# Patient Record
Sex: Male | Born: 1955 | Race: White | Hispanic: No | Marital: Married | Smoking: Former smoker
Health system: Southern US, Community
[De-identification: ages and names within clinical notes are randomized; demographics above are authoritative.]

## PROBLEM LIST (undated history)

## (undated) DIAGNOSIS — I4891 Unspecified atrial fibrillation: Secondary | ICD-10-CM

## (undated) DIAGNOSIS — K219 Gastro-esophageal reflux disease without esophagitis: Secondary | ICD-10-CM

## (undated) DIAGNOSIS — I499 Cardiac arrhythmia, unspecified: Secondary | ICD-10-CM

## (undated) DIAGNOSIS — I10 Essential (primary) hypertension: Secondary | ICD-10-CM

## (undated) DIAGNOSIS — M1990 Unspecified osteoarthritis, unspecified site: Secondary | ICD-10-CM

## (undated) DIAGNOSIS — N400 Enlarged prostate without lower urinary tract symptoms: Secondary | ICD-10-CM

## (undated) HISTORY — PX: COLONOSCOPY: SHX174

## (undated) HISTORY — PX: TONSILLECTOMY: SUR1361

## (undated) HISTORY — PX: VASECTOMY: SHX75

---

## 2005-06-25 ENCOUNTER — Emergency Department (HOSPITAL_COMMUNITY): Admission: EM | Admit: 2005-06-25 | Discharge: 2005-06-25 | Payer: Self-pay | Admitting: Emergency Medicine

## 2005-09-18 ENCOUNTER — Emergency Department (HOSPITAL_COMMUNITY): Admission: EM | Admit: 2005-09-18 | Discharge: 2005-09-18 | Payer: Self-pay | Admitting: Emergency Medicine

## 2006-01-26 ENCOUNTER — Emergency Department (HOSPITAL_COMMUNITY): Admission: EM | Admit: 2006-01-26 | Discharge: 2006-01-26 | Payer: Self-pay | Admitting: Emergency Medicine

## 2006-02-01 ENCOUNTER — Emergency Department (HOSPITAL_COMMUNITY): Admission: EM | Admit: 2006-02-01 | Discharge: 2006-02-01 | Payer: Self-pay | Admitting: Emergency Medicine

## 2008-02-23 ENCOUNTER — Emergency Department (HOSPITAL_COMMUNITY): Admission: EM | Admit: 2008-02-23 | Discharge: 2008-02-23 | Payer: Self-pay | Admitting: Emergency Medicine

## 2008-06-16 ENCOUNTER — Emergency Department (HOSPITAL_COMMUNITY): Admission: EM | Admit: 2008-06-16 | Discharge: 2008-06-16 | Payer: Self-pay | Admitting: Emergency Medicine

## 2010-10-25 ENCOUNTER — Emergency Department (HOSPITAL_COMMUNITY): Admission: EM | Admit: 2010-10-25 | Discharge: 2010-01-27 | Payer: Self-pay | Admitting: Emergency Medicine

## 2011-02-10 LAB — COMPREHENSIVE METABOLIC PANEL WITH GFR
ALT: 31 U/L (ref 0–53)
AST: 22 U/L (ref 0–37)
Albumin: 3.9 g/dL (ref 3.5–5.2)
Alkaline Phosphatase: 60 U/L (ref 39–117)
BUN: 20 mg/dL (ref 6–23)
CO2: 25 meq/L (ref 19–32)
Calcium: 9.2 mg/dL (ref 8.4–10.5)
Chloride: 107 meq/L (ref 96–112)
Creatinine, Ser: 0.96 mg/dL (ref 0.4–1.5)
GFR calc non Af Amer: >60 mL/min
Glucose, Bld: 122 mg/dL — ABNORMAL HIGH (ref 70–99)
Potassium: 4 meq/L (ref 3.5–5.1)
Sodium: 139 meq/L (ref 135–145)
Total Bilirubin: 0.9 mg/dL (ref 0.3–1.2)
Total Protein: 7.2 g/dL (ref 6.0–8.3)

## 2011-02-10 LAB — DIFFERENTIAL
Basophils Absolute: 0 10*3/uL (ref 0.0–0.1)
Basophils Relative: 0 % (ref 0–1)
Eosinophils Absolute: 0.3 10*3/uL (ref 0.0–0.7)
Eosinophils Relative: 4 % (ref 0–5)
Lymphocytes Relative: 29 % (ref 12–46)
Lymphs Abs: 1.7 10*3/uL (ref 0.7–4.0)
Monocytes Absolute: 0.5 10*3/uL (ref 0.1–1.0)
Monocytes Relative: 9 % (ref 3–12)
Neutro Abs: 3.4 10*3/uL (ref 1.7–7.7)
Neutrophils Relative %: 57 % (ref 43–77)

## 2011-02-10 LAB — CBC
HCT: 40.7 % (ref 39.0–52.0)
Hemoglobin: 14.4 g/dL (ref 13.0–17.0)
MCHC: 35.3 g/dL (ref 30.0–36.0)
MCV: 89 fL (ref 78.0–100.0)
Platelets: 298 K/uL (ref 150–400)
RBC: 4.57 MIL/uL (ref 4.22–5.81)
RDW: 13.1 % (ref 11.5–15.5)
WBC: 5.9 K/uL (ref 4.0–10.5)

## 2011-08-13 LAB — BASIC METABOLIC PANEL
BUN: 12
CO2: 23
Calcium: 9.7
Chloride: 108
Creatinine, Ser: 0.93
GFR calc Af Amer: >60
GFR calc non Af Amer: >60
Glucose, Bld: 102 — ABNORMAL HIGH
Potassium: 4.1
Sodium: 139

## 2011-08-13 LAB — DIFFERENTIAL
Basophils Absolute: 0
Basophils Relative: 0
Eosinophils Absolute: 0.2
Eosinophils Relative: 3
Lymphocytes Relative: 19
Lymphs Abs: 1.3
Monocytes Absolute: 0.6
Monocytes Relative: 8
Neutro Abs: 4.7
Neutrophils Relative %: 70

## 2011-08-13 LAB — POCT CARDIAC MARKERS
CKMB, poc: <1 — ABNORMAL LOW
Myoglobin, poc: 58.4
Operator id: 213671
Troponin i, poc: <0.05

## 2011-08-13 LAB — CBC
HCT: 41.8
Hemoglobin: 14.6
MCHC: 34.9
MCV: 88.1
Platelets: 294
RBC: 4.74
RDW: 13.8
WBC: 6.7

## 2011-08-13 LAB — D-DIMER, QUANTITATIVE: D-Dimer, Quant: 0.22

## 2012-09-18 ENCOUNTER — Emergency Department (HOSPITAL_COMMUNITY)
Admission: EM | Admit: 2012-09-18 | Discharge: 2012-09-18 | Disposition: A | Discharge status: Home / Self Care | Payer: Self-pay | Attending: Emergency Medicine | Admitting: Emergency Medicine

## 2012-09-18 ENCOUNTER — Encounter (HOSPITAL_COMMUNITY): Payer: Self-pay

## 2012-09-18 ENCOUNTER — Emergency Department (HOSPITAL_COMMUNITY): Payer: Self-pay

## 2012-09-18 DIAGNOSIS — M2391 Unspecified internal derangement of right knee: Secondary | ICD-10-CM

## 2012-09-18 DIAGNOSIS — Z9109 Other allergy status, other than to drugs and biological substances: Secondary | ICD-10-CM | POA: Insufficient documentation

## 2012-09-18 DIAGNOSIS — W11XXXA Fall on and from ladder, initial encounter: Secondary | ICD-10-CM | POA: Insufficient documentation

## 2012-09-18 DIAGNOSIS — Y929 Unspecified place or not applicable: Secondary | ICD-10-CM | POA: Insufficient documentation

## 2012-09-18 DIAGNOSIS — Z87891 Personal history of nicotine dependence: Secondary | ICD-10-CM | POA: Insufficient documentation

## 2012-09-18 DIAGNOSIS — S46009A Unspecified injury of muscle(s) and tendon(s) of the rotator cuff of unspecified shoulder, initial encounter: Secondary | ICD-10-CM

## 2012-09-18 DIAGNOSIS — Y9389 Activity, other specified: Secondary | ICD-10-CM | POA: Insufficient documentation

## 2012-09-18 DIAGNOSIS — S43429A Sprain of unspecified rotator cuff capsule, initial encounter: Secondary | ICD-10-CM | POA: Insufficient documentation

## 2012-09-18 DIAGNOSIS — M2390 Unspecified internal derangement of unspecified knee: Secondary | ICD-10-CM | POA: Insufficient documentation

## 2012-09-18 MED ORDER — HYDROCODONE-ACETAMINOPHEN 5-325 MG PO TABS: ORAL_TABLET | ORAL | Status: DC

## 2012-09-18 MED ORDER — TRAMADOL HCL 50 MG PO TABS
100.0000 mg | ORAL_TABLET | Freq: Once | ORAL | Status: AC
  Administered 2012-09-18: 100 mg via ORAL
  Filled 2012-09-18: qty 2

## 2012-09-18 MED ORDER — ACETAMINOPHEN 500 MG PO TABS
1000.0000 mg | ORAL_TABLET | Freq: Once | ORAL | Status: AC
  Administered 2012-09-18: 1000 mg via ORAL
  Filled 2012-09-18: qty 2

## 2012-09-18 NOTE — ED Provider Notes (Signed)
History   This chart was scribed for Ward Givens, MD by Gerlean Ren. This patient was seen in room APA01/APA01 and the patient's care was started at 10:53.   CSN: [id number]  Arrival date & time 09/18/12  1022   First MD Initiated Contact with Patient 09/18/12 1049      Chief Complaint  Patient presents with  . Shoulder Injury    (Consider location/radiation/quality/duration/timing/severity/associated sxs/prior treatment) The history is provided by the patient. No language interpreter was used.   Melvin Turner is a 56 y.o. male who presents to the Emergency Department complaining of constant, throbbing right shoulder pain radiating to elbow and constant, throbbing, non-radiating right knee pain after falling from ladder at height of 6 feet 2 days ago.  Pt landed on asphalt.  Pt denies head trauma, LOC, and any further injuries as a result.  Pt is unsure of most recent tetanus shot, and denies wanting one today.  Pt has no h/o chronic medical conditions.  Pt reports former tobacco use and reports alcohol use.     PCP is Dr. Olena Leatherwood in [place]   History reviewed. No pertinent past medical history.  History reviewed. No pertinent past surgical history.  No family history on file.  History  Substance Use Topics  . Smoking status: Former Games developer  . Smokeless tobacco: Not on file  . 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       Right shoulder pain. Right knee pain. Right hip pain.  Skin: Negative for wound.  All other systems reviewed and are negative.    Allergies  Poison ivy extract  Home Medications   Current Outpatient Rx  Name Route Sig Dispense Refill  none  BP 141/95  Pulse 70  Temp 97.9 F (36.6 C) (Oral)  Resp 16  Ht 5\' 11"  (1.803 m)  Wt 215 lb (97.523 kg)  BMI 29.99 kg/m2  SpO2 96%  Vital signs normal   Physical Exam  Nursing note and vitals  reviewed. Constitutional: He is oriented to person, place, and time. He appears well-developed and well-nourished.  Non-toxic appearance. He does not appear ill. No distress.  HENT:  Head: Normocephalic and atraumatic.  Right Ear: External ear normal.  Left Ear: External ear normal.  Nose: Nose normal. No mucosal edema or rhinorrhea.  Mouth/Throat: Oropharynx is clear and moist and mucous membranes are normal. No dental abscesses or uvula swelling.  Eyes: Conjunctivae normal and EOM are normal. Pupils are equal, round, and reactive to light.  Neck: Normal range of motion and full passive range of motion without pain. Neck supple.  Cardiovascular: Normal rate, regular rhythm and normal heart sounds.  Exam reveals no gallop and no friction rub.   No murmur heard. Pulmonary/Chest: Effort normal and breath sounds normal. No respiratory distress. He has no wheezes. He has no rhonchi. He has no rales. He exhibits no tenderness and no crepitus.  Abdominal: Soft. Normal appearance and bowel sounds are normal. He exhibits no distension. There is no tenderness. There is no rebound and no guarding.  Musculoskeletal: Normal range of motion. He exhibits no edema and no tenderness.       Non-tender clavicles. Non-tender to palpation in right shoulder.  Pain with right shoulder abduction which is restricted to about 60 degrees. Superficial abrasions on anterior right knee with diffuse swelling and small effusion. Palm-sized bruise  over proximal lateral right thigh.  Neurological: He is alert and oriented to person, place, and time. He has normal strength. No cranial nerve deficit.  Skin: Skin is warm, dry and intact. No rash noted. No erythema. No pallor.  Psychiatric: He has a normal mood and affect. His speech is normal and behavior is normal. His mood appears not anxious.    ED Course  Procedures (including critical care time)   Medications  traMADol (ULTRAM) tablet 100 mg (100 mg Oral Given 09/18/12  1153)  acetaminophen (TYLENOL) tablet 1,000 mg (1000 mg Oral Given 09/18/12 1153)    DIAGNOSTIC STUDIES: Oxygen Saturation is 96% on room air, adequate by my interpretation.    COORDINATION OF CARE: 10:59- Patient informed of clinical course, understands medical decision-making process, and agrees with plan.  Ordered PO ultram, PO tylenol, right shoulder XR, and right knee XR.   Pt placed in knee immobilizer. When discussed his xrays he is now able to lift him right arm to almost 90 degrees and states that is painful. States the tramadol didn't help.  Dg Shoulder Right  09/18/2012  *RADIOLOGY REPORT*  Clinical Data: 56 year old male status post fall with pain.  RIGHT SHOULDER - 2+ VIEW  Comparison: None.  Findings: No glenohumeral joint dislocation.  Proximal right humerus intact.  Right clavicle intact.  Right acromioclavicular joint degeneration with some chronic appearing osseous fragments. Visible right upper ribs and lung parenchyma within normal limits.  IMPRESSION: No acute fracture or dislocation identified about the right shoulder.   Original Report Authenticated By: Erskine Speed, M.D.    Dg Knee Complete 4 Views Right  09/18/2012  *RADIOLOGY REPORT*  Clinical Data: 56 year old male status post fall with pain and swelling.  RIGHT KNEE - COMPLETE 4+ VIEW  Comparison: None.  Findings: Small suprapatellar joint effusion.  Patella intact. Anterior soft tissue swelling anterior to the patella.  Mild medial compartment joint space loss and degenerative spurring.  No acute fracture identified.  IMPRESSION: Small joint effusion and anterior soft tissue swelling. No acute fracture or dislocation identified about the right knee.   Original Report Authenticated By: Erskine Speed, M.D.      1. Fall from ladder   2. Rotator cuff injury   3. Internal derangement of right knee    New Prescriptions   HYDROCODONE-ACETAMINOPHEN (NORCO/VICODIN) 5-325 MG PER TABLET    Take 1 or 2 po Q 6hrs for pain     Plan discharge  Devoria Albe, MD, FACEP    MDM   I personally performed the services described in this documentation, which was scribed in my presence. The recorded information has been reviewed and considered.  Devoria Albe, MD, Armando Gang         Ward Givens, MD 09/18/12 [phone]

## 2012-09-18 NOTE — ED Notes (Signed)
Pt c/o right shoulder pain after fall from ladder 2 days prior. Denies hitting head or LOC.

## 2014-03-10 ENCOUNTER — Emergency Department (HOSPITAL_COMMUNITY)
Admission: EM | Admit: 2014-03-10 | Discharge: 2014-03-10 | Disposition: A | Discharge status: Home / Self Care | Payer: Self-pay | Attending: Emergency Medicine | Admitting: Emergency Medicine

## 2014-03-10 ENCOUNTER — Encounter (HOSPITAL_COMMUNITY): Payer: Self-pay | Admitting: Emergency Medicine

## 2014-03-10 DIAGNOSIS — Y9389 Activity, other specified: Secondary | ICD-10-CM | POA: Insufficient documentation

## 2014-03-10 DIAGNOSIS — S0086XA Insect bite (nonvenomous) of other part of head, initial encounter: Secondary | ICD-10-CM

## 2014-03-10 DIAGNOSIS — Z79899 Other long term (current) drug therapy: Secondary | ICD-10-CM | POA: Insufficient documentation

## 2014-03-10 DIAGNOSIS — M129 Arthropathy, unspecified: Secondary | ICD-10-CM | POA: Insufficient documentation

## 2014-03-10 DIAGNOSIS — S60469A Insect bite (nonvenomous) of unspecified finger, initial encounter: Principal | ICD-10-CM

## 2014-03-10 DIAGNOSIS — Z87891 Personal history of nicotine dependence: Secondary | ICD-10-CM | POA: Insufficient documentation

## 2014-03-10 DIAGNOSIS — Y929 Unspecified place or not applicable: Secondary | ICD-10-CM | POA: Insufficient documentation

## 2014-03-10 DIAGNOSIS — W57XXXA Bitten or stung by nonvenomous insect and other nonvenomous arthropods, initial encounter: Principal | ICD-10-CM

## 2014-03-10 DIAGNOSIS — L089 Local infection of the skin and subcutaneous tissue, unspecified: Secondary | ICD-10-CM | POA: Insufficient documentation

## 2014-03-10 HISTORY — DX: Unspecified osteoarthritis, unspecified site: M19.90

## 2014-03-10 MED ORDER — HYDROCODONE-ACETAMINOPHEN 5-325 MG PO TABS: 1.0000 | ORAL_TABLET | ORAL | Status: DC | PRN

## 2014-03-10 MED ORDER — LIDOCAINE HCL (PF) 1 % IJ SOLN
INTRAMUSCULAR | Status: AC
  Filled 2014-03-10: qty 5

## 2014-03-10 MED ORDER — DOXYCYCLINE HYCLATE 100 MG PO TABS
100.0000 mg | ORAL_TABLET | Freq: Once | ORAL | Status: AC
  Administered 2014-03-10: 100 mg via ORAL
  Filled 2014-03-10: qty 1

## 2014-03-10 MED ORDER — CEFTRIAXONE SODIUM 1 G IJ SOLR
1.0000 g | Freq: Once | INTRAMUSCULAR | Status: AC
  Administered 2014-03-10: 1 g via INTRAMUSCULAR
  Filled 2014-03-10: qty 10

## 2014-03-10 MED ORDER — MINOCYCLINE HCL 100 MG PO CAPS: 100.0000 mg | ORAL_CAPSULE | Freq: Two times a day (BID) | ORAL | Status: DC

## 2014-03-10 NOTE — ED Notes (Signed)
Swollen area right side of nose,  Sore area .  Times one week.

## 2014-03-10 NOTE — ED Notes (Signed)
Patient given discharge instruction, verbalized understand. Patient ambulatory out of the department.  

## 2014-03-10 NOTE — Discharge Instructions (Signed)
Please apply warm compress to the right face 2 or 3 times daily. Use minocin 2 times daily with food until all taken. Please see Dr Olena LeatherwoodHasanaj in 3 or 4 days for recheck of the wound. Please come to the ED if any sign of infection advancing before you are seen by Dr Jerry CarasHansanaj. Use ibuprofen for mild discomfort. Use norco for more severe discomfort. This may cause drowsiness, use with caution. Facial Infection You have an infection of your face. This requires special attention to help prevent serious problems. Infections in facial wounds can cause poor healing and scars. They can also spread to deeper tissues, especially around the eye. Wound and dental infections can lead to sinusitis, infection of the eye socket, and even meningitis. Permanent damage to the skin, eye, and nervous system may result if facial infections are not treated properly. With severe infections, hospital care for IV antibiotic injections may be needed if they don't respond to oral antibiotics. Antibiotics must be taken for the full course to insure the infection is eliminated. If the infection came from a bad tooth, it may have to be extracted when the infection is under control. Warm compresses may be applied to reduce skin irritation and remove drainage. You might need a tetanus shot now if:  You cannot remember when your last tetanus shot was.  You have never had a tetanus shot.  The object that caused your wound was dirty. If you need a tetanus shot, and you decide not to get one, there is a rare chance of getting tetanus. Sickness from tetanus can be serious. If you got a tetanus shot, your arm may swell, get red and warm to the touch at the shot site. This is common and not a problem. SEEK IMMEDIATE MEDICAL CARE IF:   You have increased swelling, redness, or trouble breathing.  You have a severe headache, dizziness, nausea, or vomiting.  You develop problems with your eyesight.  You have a fever. Document Released:  12/12/2004 Document Revised: 01/27/2012 Document Reviewed: 11/04/2005 [facility] Patient Information 2014 [employer], [place].

## 2014-03-10 NOTE — ED Provider Notes (Signed)
CSN: [id number]     Arrival date & time 03/10/14  1613 History   First MD Initiated Contact with Patient 03/10/14 1622     Chief Complaint  Patient presents with  . Insect Bite     (Consider location/radiation/quality/duration/timing/severity/associated sxs/prior Treatment) HPI Comments: Pt sustained an insect bite to the right face nearly 1 week ago. He now has swelling of the area extending nearly to the lower right eye. He denies visual changes. No c/o difficulty with breathing. No high fevers. He has tried otc creams and ointments without success. He request evaluation of this problem.  The history is provided by the patient.    Past Medical History  Diagnosis Date  . Arthritis    History reviewed. No pertinent past surgical history. History reviewed. No pertinent family history. History  Substance Use Topics  . Smoking status: Former Games developermoker  . Smokeless tobacco: Not on file  . Alcohol Use: Yes     Comment: occ    Review of Systems  Constitutional: Negative for activity change.       All ROS Neg except as noted in HPI  HENT: Negative for nosebleeds.   Eyes: Negative for photophobia and discharge.  Respiratory: Negative for cough, shortness of breath and wheezing.   Cardiovascular: Negative for chest pain and palpitations.  Gastrointestinal: Negative for abdominal pain and blood in stool.  Genitourinary: Negative for dysuria, frequency and hematuria.  Musculoskeletal: Positive for arthralgias. Negative for back pain and neck pain.  Skin: Positive for wound.  Neurological: Negative for dizziness, seizures and speech difficulty.  Psychiatric/Behavioral: Negative for hallucinations and confusion.      Allergies  Poison ivy extract  Home Medications   Prior to Admission medications   Medication Sig Start Date End Date Taking? Authorizing Provider  HYDROcodone-acetaminophen (NORCO/VICODIN) 5-325 MG per tablet Take 1 or 2 po Q 6hrs for pain 09/18/12   Ward GivensIva L Knapp, MD   ibuprofen (ADVIL,MOTRIN) 200 MG tablet Take 800 mg by mouth every 6 (six) hours as needed. pain    Historical Provider, MD   BP 158/90  Pulse 72  Temp(Src) 97.7 F (36.5 C) (Oral)  Resp 16  Ht 5\' 11"  (1.803 m)  Wt 230 lb (104.327 kg)  BMI 32.09 kg/m2  SpO2 97% Physical Exam  Nursing note and vitals reviewed. Constitutional: He is oriented to person, place, and time. He appears well-developed and well-nourished.  Non-toxic appearance.  HENT:  Head: Normocephalic.  Right Ear: Tympanic membrane and external ear normal.  Left Ear: Tympanic membrane and external ear normal.  Pt has a dark scabbed area at the mid lateral right nostril. There is increase redness and swelling present around the scabbed area. No red streaking. No infiltration of the right nostril. Airway patent, speech is clear.  Eyes: EOM and lids are normal. Pupils are equal, round, and reactive to light.  No compromise of the EOMs. Anterior chamber clear.  Neck: Normal range of motion. Neck supple. Carotid bruit is not present.  Cardiovascular: Normal rate, regular rhythm, normal heart sounds, intact distal pulses and normal pulses.   Pulmonary/Chest: Breath sounds normal. No respiratory distress.  Abdominal: Soft. Bowel sounds are normal. There is no tenderness. There is no guarding.  Musculoskeletal: Normal range of motion.  Lymphadenopathy:       Head (right side): No submandibular adenopathy present.       Head (left side): No submandibular adenopathy present.    He has no cervical adenopathy.  Neurological: He is alert and  oriented to person, place, and time. He has normal strength. No cranial nerve deficit or sensory deficit.  Skin: Skin is warm and dry.  Psychiatric: He has a normal mood and affect. His speech is normal.    ED Course  Procedures (including critical care time) Labs Review Labs Reviewed - No data to display  Imaging Review No results found.   EKG Interpretation None      MDM Vital  signs wnl. Pulse ox 97% on room air. WNL by my interpretation. Pt has an infected wound to the right face. No compromise of the EOMs. No facial motor or sensory deficit. Plan - Pt treated with rocephin and doxycycline in the ED. Rx for minocycline given to the patient. Pt advised to apply warm compresses to the face 2 or 3 times daily. He will return if any advancing signs of infection.   Final diagnoses:  None    *I have reviewed nursing notes, vital signs, and all appropriate lab and imaging results for this patient.**    Kathie DikeHobson M Ashlley Booher, PA-C 03/10/14 1644  Kathie DikeHobson M Beverlyn Mcginness, PA-C 03/18/14 [phone]

## 2014-03-18 NOTE — ED Provider Notes (Signed)
Medical screening examination/treatment/procedure(s) were performed by non-physician practitioner and as supervising physician I was immediately available for consultation/collaboration.   EKG Interpretation None        Benny LennertJoseph L Joneric Streight, MD 03/18/14 [phone]

## 2014-12-24 ENCOUNTER — Encounter (HOSPITAL_COMMUNITY): Payer: Self-pay | Admitting: *Deleted

## 2014-12-24 ENCOUNTER — Emergency Department (HOSPITAL_COMMUNITY)
Admission: EM | Admit: 2014-12-24 | Discharge: 2014-12-24 | Disposition: A | Discharge status: Home / Self Care | Payer: Self-pay | Attending: Emergency Medicine | Admitting: Emergency Medicine

## 2014-12-24 ENCOUNTER — Emergency Department (HOSPITAL_COMMUNITY): Payer: Self-pay

## 2014-12-24 DIAGNOSIS — M25561 Pain in right knee: Secondary | ICD-10-CM

## 2014-12-24 DIAGNOSIS — M1711 Unilateral primary osteoarthritis, right knee: Secondary | ICD-10-CM | POA: Insufficient documentation

## 2014-12-24 DIAGNOSIS — Z79899 Other long term (current) drug therapy: Secondary | ICD-10-CM | POA: Insufficient documentation

## 2014-12-24 DIAGNOSIS — M1990 Unspecified osteoarthritis, unspecified site: Secondary | ICD-10-CM | POA: Insufficient documentation

## 2014-12-24 DIAGNOSIS — M25461 Effusion, right knee: Secondary | ICD-10-CM

## 2014-12-24 DIAGNOSIS — Z87891 Personal history of nicotine dependence: Secondary | ICD-10-CM | POA: Insufficient documentation

## 2014-12-24 MED ORDER — OXYCODONE-ACETAMINOPHEN 7.5-325 MG PO TABS: 1.0000 | ORAL_TABLET | ORAL | Status: DC | PRN

## 2014-12-24 MED ORDER — IBUPROFEN 800 MG PO TABS
800.0000 mg | ORAL_TABLET | Freq: Once | ORAL | Status: AC
  Administered 2014-12-24: 800 mg via ORAL
  Filled 2014-12-24: qty 1

## 2014-12-24 NOTE — ED Provider Notes (Signed)
CSN: [id number]     Arrival date & time 12/24/14  0533 History   First MD Initiated Contact with Patient 12/24/14 [phone]     Chief Complaint  Patient presents with  . Knee Pain     (Consider location/radiation/quality/duration/timing/severity/associated sxs/prior Treatment) The history is provided by the patient.   59 year old male with history of pain in his right knee noted that pain got worse 5 days ago when he had been doing some work going up and down a ladder. Since then, he has complained of severe pain and swelling in the right knee and inability to bear weight. He has been taking acetaminophen and hydrocodone-acetaminophen with no relief. He states he has an appointment with his orthopedic physician in 2 days but cannot wait for that appointment. He has been told that he may need a knee replacement. He states that there have been no x-rays done on his knee recently.   Past Medical History  Diagnosis Date  . Arthritis    No past surgical history on file. No family history on file. History  Substance Use Topics  . Smoking status: Former Games developermoker  . Smokeless tobacco: Not on file  . Alcohol Use: Yes     Comment: occ    Review of Systems  All other systems reviewed and are negative.     Allergies  Poison ivy extract  Home Medications   Prior to Admission medications   Medication Sig Start Date End Date Taking? Authorizing Provider  HYDROcodone-acetaminophen (NORCO/VICODIN) 5-325 MG per tablet Take 1 or 2 po Q 6hrs for pain 09/18/12   Ward GivensIva L Knapp, MD  HYDROcodone-acetaminophen (NORCO/VICODIN) 5-325 MG per tablet Take 1 tablet by mouth every 4 (four) hours as needed for moderate pain. 03/10/14   Kathie DikeHobson M Bryant, PA-C  ibuprofen (ADVIL,MOTRIN) 200 MG tablet Take 800 mg by mouth every 6 (six) hours as needed. pain    Historical Provider, MD  minocycline (MINOCIN) 100 MG capsule Take 1 capsule (100 mg total) by mouth 2 (two) times daily. 03/10/14   Kathie DikeHobson M Bryant, PA-C   BP  161/109 mmHg  Pulse 74  Temp(Src) 97.8 F (36.6 C) (Oral)  Resp 20  Ht 5\' 11"  (1.803 m)  Wt 217 lb (98.431 kg)  BMI 30.28 kg/m2  SpO2 100% Physical Exam  Nursing note and vitals reviewed.  59 year old male, who appears uncomfortable, but is in no acute distress. Vital signs are significant for hypertension. Oxygen saturation is 100%, which is normal. Head is normocephalic and atraumatic. PERRLA, EOMI. Oropharynx is clear. Neck is nontender and supple without adenopathy or JVD. Back is nontender and there is no CVA tenderness. Lungs are clear without rales, wheezes, or rhonchi. Chest is nontender. Heart has regular rate and rhythm without murmur. Abdomen is soft, flat, nontender without masses or hepatosplenomegaly and peristalsis is normoactive. Extremities: Right knee has a moderate to large effusion present and there is pain on passive range of motion but no pain on direct palpation. Distal neurovascular exam is intact. No other extremity pathology is seen.. Skin is warm and dry without rash. Neurologic: Mental status is normal, cranial nerves are intact, there are no motor or sensory deficits.  ED Course  Procedures (including critical care time)  Imaging Review Dg Knee Complete 4 Views Right  12/24/2014   CLINICAL DATA:  Right knee pain, increased over the last 2-3 days.  EXAM: RIGHT KNEE - COMPLETE 4+ VIEW  COMPARISON:  09/18/2012  FINDINGS: Progressive medial compartment osteoarthritis with  increased joint space narrowing and osteophytes. Unchanged osteoarthritis of the patellofemoral compartment. There is a joint effusion and anterior soft tissue edema. No fracture or dislocation.  IMPRESSION: Progressive osteoarthritis. No acute bony abnormality. Joint effusion, similar to prior.   Electronically Signed   By: Rubye Oaks M.D.   On: 12/24/2014 06:58     MDM   Final diagnoses:  Pain in right knee  Knee effusion, right  Osteoarthritis of right knee, unspecified  osteoarthritis type    Right knee pain with effusion. I suspect that this is related to arthritis in the knee. No evidence of gout. He will be sent for x-rays. He drove himself, so narcotics will not be given in the ED. He is given a dose of ibuprofen for pain. Review of his record on the [place] controlled substance reporting website shows that he has been getting prescriptions for 120 hydrocodone-acetaminophen 7.5-325 once a month for the last 6 months with last prescription filled on January 7.  X-ray confirms worsening of osteoarthritis. Patient now advises me that he is taking naproxen 500 mg twice a day in addition to above noted medications. He is given a knee immobilizer and crutches to use as needed and prescription is given for oxycodone-acetaminophen 7.5-325 to see if I will give him better pain relief and hydrocodone. He is referred back to his orthopedic physician for further evaluation and treatment.  Dione Booze, MD 12/24/14 [phone]

## 2014-12-24 NOTE — ED Notes (Signed)
Pt refused crutches.

## 2014-12-24 NOTE — Discharge Instructions (Signed)
Apply ice to your knee. Where the immobilizer and use crutches as needed. Continue taking Naprosyn 500 mg twice a day. Make an appointment with your orthopedic doctor for further evaluation and consideration for steroid injection.  Osteoarthritis Osteoarthritis is a disease that causes soreness and inflammation of a joint. It occurs when the cartilage at the affected joint wears down. Cartilage acts as a cushion, covering the ends of bones where they meet to form a joint. Osteoarthritis is the most common form of arthritis. It often occurs in older people. The joints affected most often by this condition include those in the:  Ends of the fingers.  Thumbs.  Neck.  Lower back.  Knees.  Hips. CAUSES  Over time, the cartilage that covers the ends of bones begins to wear away. This causes bone to rub on bone, producing pain and stiffness in the affected joints.  RISK FACTORS Certain factors can increase your chances of having osteoarthritis, including:  Older age.  Excessive body weight.  Overuse of joints.  Previous joint injury. SIGNS AND SYMPTOMS   Pain, swelling, and stiffness in the joint.  Over time, the joint may lose its normal shape.  Small deposits of bone (osteophytes) may grow on the edges of the joint.  Bits of bone or cartilage can break off and float inside the joint space. This may cause more pain and damage. DIAGNOSIS  Your health care provider will do a physical exam and ask about your symptoms. Various tests may be ordered, such as:  X-rays of the affected joint.  An MRI scan.  Blood tests to rule out other types of arthritis.  Joint fluid tests. This involves using a needle to draw fluid from the joint and examining the fluid under a microscope. TREATMENT  Goals of treatment are to control pain and improve joint function. Treatment plans may include:  A prescribed exercise program that allows for rest and joint relief.  A weight control  plan.  Pain relief techniques, such as:  Properly applied heat and cold.  Electric pulses delivered to nerve endings under the skin (transcutaneous electrical nerve stimulation [TENS]).  Massage.  Certain nutritional supplements.  Medicines to control pain, such as:  Acetaminophen.  Nonsteroidal anti-inflammatory drugs (NSAIDs), such as naproxen.  Narcotic or central-acting agents, such as tramadol.  Corticosteroids. These can be given orally or as an injection.  Surgery to reposition the bones and relieve pain (osteotomy) or to remove loose pieces of bone and cartilage. Joint replacement may be needed in advanced states of osteoarthritis. HOME CARE INSTRUCTIONS   Take medicines only as directed by your health care provider.  Maintain a healthy weight. Follow your health care provider's instructions for weight control. This may include dietary instructions.  Exercise as directed. Your health care provider can recommend specific types of exercise. These may include:  Strengthening exercises. These are done to strengthen the muscles that support joints affected by arthritis. They can be performed with weights or with exercise bands to add resistance.  Aerobic activities. These are exercises, such as brisk walking or low-impact aerobics, that get your heart pumping.  Range-of-motion activities. These keep your joints limber.  Balance and agility exercises. These help you maintain daily living skills.  Rest your affected joints as directed by your health care provider.  Keep all follow-up visits as directed by your health care provider. SEEK MEDICAL CARE IF:   Your skin turns red.  You develop a rash in addition to your joint pain.  You  have worsening joint pain.  You have a fever along with joint or muscle aches. SEEK IMMEDIATE MEDICAL CARE IF:  You have a significant loss of weight or appetite.  You have night sweats. FOR MORE INFORMATION   National Institute  of Arthritis and Musculoskeletal and Skin Diseases: www.niams.http://www.myers.net/  [employer] on Aging: https://walker.com/  American College of Rheumatology: www.rheumatology.org Document Released: 11/04/2005 Document Revised: 03/21/2014 Document Reviewed: 07/12/2013 [facility] Patient Information 2015 [place], [place]. This information is not intended to replace advice given to you by your health care provider. Make sure you discuss any questions you have with your health care provider.  Acetaminophen; Oxycodone tablets What is this medicine? ACETAMINOPHEN; OXYCODONE (a set a MEE noe fen; ox i KOE done) is a pain reliever. It is used to treat mild to moderate pain. This medicine may be used for other purposes; ask your health care provider or pharmacist if you have questions. COMMON BRAND NAME(S): Endocet, Magnacet, Narvox, Percocet, Perloxx, Primalev, Primlev, Roxicet, Xolox What should I tell my health care provider before I take this medicine? They need to know if you have any of these conditions: -brain tumor -Crohn's disease, inflammatory bowel disease, or ulcerative colitis -drug abuse or addiction -head injury -heart or circulation problems -if you often drink alcohol -kidney disease or problems going to the bathroom -liver disease -lung disease, asthma, or breathing problems -an unusual or allergic reaction to acetaminophen, oxycodone, other opioid analgesics, other medicines, foods, dyes, or preservatives -pregnant or trying to get pregnant -breast-feeding How should I use this medicine? Take this medicine by mouth with a full glass of water. Follow the directions on the prescription label. Take your medicine at regular intervals. Do not take your medicine more often than directed. Talk to your pediatrician regarding the use of this medicine in children. Special care may be needed. Patients over 58 years old may have a stronger reaction and need a smaller dose. Overdosage: If you think  you have taken too much of this medicine contact a poison control center or emergency room at once. NOTE: This medicine is only for you. Do not share this medicine with others. What if I miss a dose? If you miss a dose, take it as soon as you can. If it is almost time for your next dose, take only that dose. Do not take double or extra doses. What may interact with this medicine? -alcohol -antihistamines -barbiturates like amobarbital, butalbital, butabarbital, methohexital, pentobarbital, phenobarbital, thiopental, and secobarbital -benztropine -drugs for bladder problems like solifenacin, trospium, oxybutynin, tolterodine, hyoscyamine, and methscopolamine -drugs for breathing problems like ipratropium and tiotropium -drugs for certain stomach or intestine problems like propantheline, homatropine methylbromide, glycopyrrolate, atropine, belladonna, and dicyclomine -general anesthetics like etomidate, ketamine, nitrous oxide, propofol, desflurane, enflurane, halothane, isoflurane, and sevoflurane -medicines for depression, anxiety, or psychotic disturbances -medicines for sleep -muscle relaxants -naltrexone -narcotic medicines (opiates) for pain -phenothiazines like perphenazine, thioridazine, chlorpromazine, mesoridazine, fluphenazine, prochlorperazine, promazine, and trifluoperazine -scopolamine -tramadol -trihexyphenidyl This list may not describe all possible interactions. Give your health care provider a list of all the medicines, herbs, non-prescription drugs, or dietary supplements you use. Also tell them if you smoke, drink alcohol, or use illegal drugs. Some items may interact with your medicine. What should I watch for while using this medicine? Tell your doctor or health care professional if your pain does not go away, if it gets worse, or if you have new or a different type of pain. You may develop tolerance to the medicine. Tolerance  means that you will need a higher dose of the  medication for pain relief. Tolerance is normal and is expected if you take this medicine for a long time. Do not suddenly stop taking your medicine because you may develop a severe reaction. Your body becomes used to the medicine. This does NOT mean you are addicted. Addiction is a behavior related to getting and using a drug for a non-medical reason. If you have pain, you have a medical reason to take pain medicine. Your doctor will tell you how much medicine to take. If your doctor wants you to stop the medicine, the dose will be slowly lowered over time to avoid any side effects. You may get drowsy or dizzy. Do not drive, use machinery, or do anything that needs mental alertness until you know how this medicine affects you. Do not stand or sit up quickly, especially if you are an older patient. This reduces the risk of dizzy or fainting spells. Alcohol may interfere with the effect of this medicine. Avoid alcoholic drinks. There are different types of narcotic medicines (opiates) for pain. If you take more than one type at the same time, you may have more side effects. Give your health care provider a list of all medicines you use. Your doctor will tell you how much medicine to take. Do not take more medicine than directed. Call emergency for help if you have problems breathing. The medicine will cause constipation. Try to have a bowel movement at least every 2 to 3 days. If you do not have a bowel movement for 3 days, call your doctor or health care professional. Do not take Tylenol (acetaminophen) or medicines that have acetaminophen with this medicine. Too much acetaminophen can be very dangerous. Many nonprescription medicines contain acetaminophen. Always read the labels carefully to avoid taking more acetaminophen. What side effects may I notice from receiving this medicine? Side effects that you should report to your doctor or health care professional as soon as possible: -allergic reactions like  skin rash, itching or hives, swelling of the face, lips, or tongue -breathing difficulties, wheezing -confusion -light headedness or fainting spells -severe stomach pain -unusually weak or tired -yellowing of the skin or the whites of the eyes Side effects that usually do not require medical attention (report to your doctor or health care professional if they continue or are bothersome): -dizziness -drowsiness -nausea -vomiting This list may not describe all possible side effects. Call your doctor for medical advice about side effects. You may report side effects to FDA at 1-800-FDA-1088. Where should I keep my medicine? Keep out of the reach of children. This medicine can be abused. Keep your medicine in a safe place to protect it from theft. Do not share this medicine with anyone. Selling or giving away this medicine is dangerous and against the law. Store at room temperature between 20 and 25 degrees C (68 and 77 degrees F). Keep container tightly closed. Protect from light. This medicine may cause accidental overdose and death if it is taken by other adults, children, or pets. Flush any unused medicine down the toilet to reduce the chance of harm. Do not use the medicine after the expiration date. NOTE: This sheet is a summary. It may not cover all possible information. If you have questions about this medicine, talk to your doctor, pharmacist, or health care provider.  2015, Elsevier/Gold Standard. (2013-06-28 13:17:35)

## 2014-12-24 NOTE — ED Notes (Signed)
Pt reporting pain and swelling in right knee.  States symptoms became worse over past couple days.

## 2015-01-30 ENCOUNTER — Encounter (HOSPITAL_COMMUNITY)
Admission: RE | Admit: 2015-01-30 | Discharge: 2015-01-30 | Disposition: A | Discharge status: Home / Self Care | Payer: Self-pay | Source: Ambulatory Visit | Attending: Orthopaedic Surgery | Admitting: Orthopaedic Surgery

## 2015-01-30 ENCOUNTER — Other Ambulatory Visit: Payer: Self-pay

## 2015-01-30 ENCOUNTER — Encounter (HOSPITAL_COMMUNITY): Payer: Self-pay

## 2015-01-30 ENCOUNTER — Other Ambulatory Visit: Payer: Self-pay | Admitting: Radiology

## 2015-01-30 DIAGNOSIS — Z01812 Encounter for preprocedural laboratory examination: Secondary | ICD-10-CM | POA: Insufficient documentation

## 2015-01-30 DIAGNOSIS — Z01810 Encounter for preprocedural cardiovascular examination: Secondary | ICD-10-CM | POA: Insufficient documentation

## 2015-01-30 LAB — COMPREHENSIVE METABOLIC PANEL
ALT: 22 U/L (ref 0–53)
AST: 17 U/L (ref 0–37)
Albumin: 3.9 g/dL (ref 3.5–5.2)
Alkaline Phosphatase: 56 U/L (ref 39–117)
Anion gap: 7 (ref 5–15)
BUN: 18 mg/dL (ref 6–23)
CO2: 26 mmol/L (ref 19–32)
Calcium: 9.3 mg/dL (ref 8.4–10.5)
Chloride: 106 mmol/L (ref 96–112)
Creatinine, Ser: 0.7 mg/dL (ref 0.50–1.35)
GFR calc Af Amer: >90 mL/min (ref >90)
GFR calc non Af Amer: >90 mL/min (ref >90)
Glucose, Bld: 104 mg/dL — ABNORMAL HIGH (ref 70–99)
Potassium: 4 mmol/L (ref 3.5–5.1)
Sodium: 139 mmol/L (ref 135–145)
Total Bilirubin: 0.7 mg/dL (ref 0.3–1.2)
Total Protein: 7 g/dL (ref 6.0–8.3)

## 2015-01-30 LAB — URINALYSIS, ROUTINE W REFLEX MICROSCOPIC
Bilirubin Urine: NEGATIVE
Glucose, UA: NEGATIVE mg/dL
Hgb urine dipstick: NEGATIVE
Ketones, ur: NEGATIVE mg/dL
Leukocytes, UA: NEGATIVE
Nitrite: NEGATIVE
Protein, ur: NEGATIVE mg/dL
Specific Gravity, Urine: 1.025 (ref 1.005–1.030)
Urobilinogen, UA: 0.2 mg/dL (ref 0.0–1.0)
pH: 6 (ref 5.0–8.0)

## 2015-01-30 LAB — CBC WITH DIFFERENTIAL/PLATELET
Basophils Absolute: 0 10*3/uL (ref 0.0–0.1)
Basophils Relative: 0 % (ref 0–1)
Eosinophils Absolute: 0.2 10*3/uL (ref 0.0–0.7)
Eosinophils Relative: 2 % (ref 0–5)
HCT: 41.5 % (ref 39.0–52.0)
Hemoglobin: 13.8 g/dL (ref 13.0–17.0)
Lymphocytes Relative: 48 % — ABNORMAL HIGH (ref 12–46)
Lymphs Abs: 3.9 10*3/uL (ref 0.7–4.0)
MCH: 30.7 pg (ref 26.0–34.0)
MCHC: 33.3 g/dL (ref 30.0–36.0)
MCV: 92.2 fL (ref 78.0–100.0)
Monocytes Absolute: 0.4 10*3/uL (ref 0.1–1.0)
Monocytes Relative: 5 % (ref 3–12)
Neutro Abs: 3.6 10*3/uL (ref 1.7–7.7)
Neutrophils Relative %: 45 % (ref 43–77)
Platelets: 255 10*3/uL (ref 150–400)
RBC: 4.5 MIL/uL (ref 4.22–5.81)
RDW: 12.8 % (ref 11.5–15.5)
WBC: 8 10*3/uL (ref 4.0–10.5)

## 2015-01-30 NOTE — Pre-Procedure Instructions (Signed)
Patient given information to sign up for my chart at home. 

## 2015-01-30 NOTE — H&P (Signed)
Melvin Turner is an 59 y.o. male.   Chief Complaint: Right knee pain HPI: He has had right knee pain for some time.  I saw him around Thanksgiving of 2015 and his knee had swelling and popping but no giving way.  Around the end of January he started having more pain and giving way and swelling.  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  I have discussed arthroscopy with him and explained the risks and imponderables.  He asked appropriate questions.  He has no insurance and has discussed charges and expenses with the hospital.  He wants to proceed with the knee surgery at this time.  He knows it is an elective outpatient procedure.  He knows he will need physical therapy post operative.  He knows that the procedure will not correct his marked degenerative changes (arthritis) of the knee.  Past Medical History  Diagnosis Date  . Arthritis     No past surgical history on file.  No family history on file. Social History:  reports that he has quit smoking. He does not have any smokeless tobacco history on file. He reports that he drinks alcohol. He reports that he does not use illicit drugs.  Allergies:  Allergies  Allergen Reactions  . Poison Ivy Extract [Extract Of Poison Ivy] Hives and Rash    No prescriptions prior to admission    No results found for this or any previous visit (from the past 48 hour(s)). No results found.  Review of Systems  Musculoskeletal: Positive for joint pain (Pain, swelling tenderness of the right knee with giving way.).  All other systems reviewed and are negative.   There were no vitals taken for this visit. Physical Exam  Constitutional: He is oriented to person, place, and time. He appears well-developed and well-nourished.  HENT:  Head: Normocephalic and atraumatic.  Eyes: Conjunctivae and EOM are normal. Pupils are equal, round,  and reactive to light.  Neck: Normal range of motion. Neck supple.  Cardiovascular: Normal rate, regular rhythm, normal heart sounds and intact distal pulses.   Respiratory: Effort normal and breath sounds normal.  Musculoskeletal: He exhibits tenderness (Pain right knee with effusion.  ROM 0 to 105 and crepitus.  Positive McMurray medially.).  Neurological: He is alert and oriented to person, place, and time. He has normal reflexes.  Skin: Skin is warm and dry.  Psychiatric: He has a normal mood and affect. His behavior is normal. Judgment and thought content normal.     Assessment/Plan Tear of the medial meniscus of the right knee, for outpatient elective arthroscopy.  Delynda Sepulveda 01/30/2015, 11:04 AM

## 2015-01-30 NOTE — Patient Instructions (Addendum)
Laqueta CarinaGary F Nabers  01/30/2015   Your procedure is scheduled on:  01/31/2015  [facility] at  710  AM.  Call this number if you have problems the morning of surgery: [phone]   Remember:   Do not eat food or drink liquids after midnight.   Take these medicines the morning of surgery with A SIP OF WATER:  Hydrocodone (or percocet)   Do not wear jewelry, make-up or nail polish.  Do not wear lotions, powders, or perfumes.   Do not shave 48 hours prior to surgery. Men may shave face and neck.  Do not bring valuables to the hospital.  [facility] is not responsible for any belongings or valuables.               Contacts, dentures or bridgework may not be worn into surgery.  Leave suitcase in the car. After surgery it may be brought to your room.  For patients admitted to the hospital, discharge time is determined by your treatment team.               Patients discharged the day of surgery will not be allowed to drive home.  Name and phone number of your driver: family  Special Instructions: Shower using CHG 2 nights before surgery and the night before surgery.  If you shower the day of surgery use CHG.  Use special wash - you have one bottle of CHG for all showers.  You should use approximately 1/3 of the bottle for each shower.   Please read over the following fact sheets that you were given: Pain Booklet, Coughing and Deep Breathing, Surgical Site Infection Prevention, Anesthesia Post-op Instructions and Care and Recovery After Surgery Arthroscopic Procedure, Knee An arthroscopic procedure can find what is wrong with your knee. PROCEDURE Arthroscopy is a surgical technique that allows your orthopedic surgeon to diagnose and treat your knee injury with accuracy. They will look into your knee through a small instrument. This is almost like a small (pencil sized) telescope. Because arthroscopy affects your knee less than open knee surgery, you can anticipate a more rapid recovery.  Taking an active role by following your caregiver's instructions will help with rapid and complete recovery. Use crutches, rest, elevation, ice, and knee exercises as instructed. The length of recovery depends on various factors including type of injury, age, physical condition, medical conditions, and your rehabilitation. Your knee is the joint between the large bones (femur and tibia) in your leg. Cartilage covers these bone ends which are smooth and slippery and allow your knee to bend and move smoothly. Two menisci, thick, semi-lunar shaped pads of cartilage which form a rim inside the joint, help absorb shock and stabilize your knee. Ligaments bind the bones together and support your knee joint. Muscles move the joint, help support your knee, and take stress off the joint itself. Because of this all programs and physical therapy to rehabilitate an injured or repaired knee require rebuilding and strengthening your muscles. AFTER THE PROCEDURE  After the procedure, you will be moved to a recovery area until most of the effects of the medication have worn off. Your caregiver will discuss the test results with you.  Only take over-the-counter or prescription medicines for pain, discomfort, or fever as directed by your caregiver. SEEK MEDICAL CARE IF:   You have increased bleeding from your wounds.  You see redness, swelling, or have increasing pain in your wounds.  You have pus coming  from your wound.  You have an oral temperature above 102 F (38.9 C).  You notice a bad smell coming from the wound or dressing.  You have severe pain with any motion of your knee. SEEK IMMEDIATE MEDICAL CARE IF:   You develop a rash.  You have difficulty breathing.  You have any allergic problems. Document Released: 11/01/2000 Document Revised: 01/27/2012 Document Reviewed: 05/25/2008 [facility] Patient Information 2015 [place], [place]. This information is not intended to replace advice given to you by your  health care provider. Make sure you discuss any questions you have with your health care provider. PATIENT INSTRUCTIONS POST-ANESTHESIA  IMMEDIATELY FOLLOWING SURGERY:  Do not drive or operate machinery for the first twenty four hours after surgery.  Do not make any important decisions for twenty four hours after surgery or while taking narcotic pain medications or sedatives.  If you develop intractable nausea and vomiting or a severe headache please notify your doctor immediately.  FOLLOW-UP:  Please make an appointment with your surgeon as instructed. You do not need to follow up with anesthesia unless specifically instructed to do so.  WOUND CARE INSTRUCTIONS (if applicable):  Keep a dry clean dressing on the anesthesia/puncture wound site if there is drainage.  Once the wound has quit draining you may leave it open to air.  Generally you should leave the bandage intact for twenty four hours unless there is drainage.  If the epidural site drains for more than 36-48 hours please call the anesthesia department.  QUESTIONS?:  Please feel free to call your physician or the hospital operator if you have any questions, and they will be happy to assist you.

## 2015-01-31 ENCOUNTER — Encounter (HOSPITAL_COMMUNITY): Payer: Self-pay | Admitting: *Deleted

## 2015-01-31 ENCOUNTER — Ambulatory Visit (HOSPITAL_COMMUNITY): Payer: Self-pay | Admitting: Anesthesiology

## 2015-01-31 ENCOUNTER — Encounter (HOSPITAL_COMMUNITY)
Admission: RE | Disposition: A | Discharge status: Home / Self Care | Payer: Self-pay | Source: Ambulatory Visit | Attending: Orthopaedic Surgery

## 2015-01-31 ENCOUNTER — Ambulatory Visit (HOSPITAL_COMMUNITY)
Admission: RE | Admit: 2015-01-31 | Discharge: 2015-01-31 | Disposition: A | Discharge status: Home / Self Care | Payer: Self-pay | Source: Ambulatory Visit | Attending: Orthopaedic Surgery | Admitting: Orthopaedic Surgery

## 2015-01-31 DIAGNOSIS — M179 Osteoarthritis of knee, unspecified: Secondary | ICD-10-CM | POA: Insufficient documentation

## 2015-01-31 DIAGNOSIS — Y999 Unspecified external cause status: Secondary | ICD-10-CM | POA: Insufficient documentation

## 2015-01-31 DIAGNOSIS — Z87891 Personal history of nicotine dependence: Secondary | ICD-10-CM | POA: Insufficient documentation

## 2015-01-31 DIAGNOSIS — X58XXXA Exposure to other specified factors, initial encounter: Secondary | ICD-10-CM | POA: Insufficient documentation

## 2015-01-31 DIAGNOSIS — Y929 Unspecified place or not applicable: Secondary | ICD-10-CM | POA: Insufficient documentation

## 2015-01-31 DIAGNOSIS — Y939 Activity, unspecified: Secondary | ICD-10-CM | POA: Insufficient documentation

## 2015-01-31 DIAGNOSIS — S83241A Other tear of medial meniscus, current injury, right knee, initial encounter: Secondary | ICD-10-CM | POA: Insufficient documentation

## 2015-01-31 HISTORY — PX: KNEE ARTHROSCOPY: SHX127

## 2015-01-31 SURGERY — ARTHROSCOPY, KNEE
Anesthesia: General | Site: Knee | Laterality: Right

## 2015-01-31 MED ORDER — PROPOFOL 10 MG/ML IV BOLUS
INTRAVENOUS | Status: DC | PRN
  Administered 2015-01-31: 200 mg via INTRAVENOUS
  Administered 2015-01-31: 25 mg via INTRAVENOUS

## 2015-01-31 MED ORDER — PROPOFOL 10 MG/ML IV BOLUS
INTRAVENOUS | Status: AC
  Filled 2015-01-31: qty 20

## 2015-01-31 MED ORDER — MIDAZOLAM HCL 2 MG/2ML IJ SOLN
1.0000 mg | INTRAMUSCULAR | Status: DC | PRN
  Administered 2015-01-31 (×2): 2 mg via INTRAVENOUS
  Filled 2015-01-31: qty 2

## 2015-01-31 MED ORDER — ONDANSETRON HCL 4 MG/2ML IJ SOLN: 4.0000 mg | Freq: Once | INTRAMUSCULAR | Status: DC | PRN

## 2015-01-31 MED ORDER — MIDAZOLAM HCL 2 MG/2ML IJ SOLN
INTRAMUSCULAR | Status: AC
  Filled 2015-01-31: qty 2

## 2015-01-31 MED ORDER — DEXAMETHASONE SODIUM PHOSPHATE 4 MG/ML IJ SOLN
INTRAMUSCULAR | Status: AC
  Filled 2015-01-31: qty 1

## 2015-01-31 MED ORDER — GLYCOPYRROLATE 0.2 MG/ML IJ SOLN
0.2000 mg | Freq: Once | INTRAMUSCULAR | Status: AC
  Administered 2015-01-31: 0.2 mg via INTRAVENOUS

## 2015-01-31 MED ORDER — SODIUM CHLORIDE 0.9 % IR SOLN
Status: DC | PRN
  Administered 2015-01-31: 1000 mL

## 2015-01-31 MED ORDER — LIDOCAINE HCL (PF) 1 % IJ SOLN
INTRAMUSCULAR | Status: AC
  Filled 2015-01-31: qty 5

## 2015-01-31 MED ORDER — FENTANYL CITRATE 0.05 MG/ML IJ SOLN
25.0000 ug | INTRAMUSCULAR | Status: AC
  Administered 2015-01-31 (×2): 25 ug via INTRAVENOUS
  Filled 2015-01-31: qty 2

## 2015-01-31 MED ORDER — FENTANYL CITRATE 0.05 MG/ML IJ SOLN
INTRAMUSCULAR | Status: DC | PRN
  Administered 2015-01-31 (×2): 50 ug via INTRAVENOUS
  Administered 2015-01-31 (×3): 25 ug via INTRAVENOUS
  Administered 2015-01-31: 50 ug via INTRAVENOUS
  Administered 2015-01-31: 25 ug via INTRAVENOUS

## 2015-01-31 MED ORDER — ONDANSETRON HCL 4 MG/2ML IJ SOLN
4.0000 mg | Freq: Once | INTRAMUSCULAR | Status: AC
  Administered 2015-01-31: 4 mg via INTRAVENOUS

## 2015-01-31 MED ORDER — BUPIVACAINE HCL (PF) 0.25 % IJ SOLN
INTRAMUSCULAR | Status: DC | PRN
  Administered 2015-01-31: 30 mL

## 2015-01-31 MED ORDER — ONDANSETRON HCL 4 MG/2ML IJ SOLN
INTRAMUSCULAR | Status: AC
  Filled 2015-01-31: qty 2

## 2015-01-31 MED ORDER — BUPIVACAINE HCL (PF) 0.25 % IJ SOLN
INTRAMUSCULAR | Status: AC
  Filled 2015-01-31: qty 30

## 2015-01-31 MED ORDER — LACTATED RINGERS IV SOLN
INTRAVENOUS | Status: DC
  Administered 2015-01-31: 1000 mL via INTRAVENOUS

## 2015-01-31 MED ORDER — FENTANYL CITRATE 0.05 MG/ML IJ SOLN
25.0000 ug | INTRAMUSCULAR | Status: DC | PRN
  Administered 2015-01-31 (×4): 50 ug via INTRAVENOUS
  Filled 2015-01-31: qty 2

## 2015-01-31 MED ORDER — CHLORHEXIDINE GLUCONATE 4 % EX LIQD: 60.0000 mL | Freq: Once | CUTANEOUS | Status: DC

## 2015-01-31 MED ORDER — FENTANYL CITRATE 0.05 MG/ML IJ SOLN
INTRAMUSCULAR | Status: AC
  Filled 2015-01-31: qty 2

## 2015-01-31 MED ORDER — FENTANYL CITRATE 0.05 MG/ML IJ SOLN
INTRAMUSCULAR | Status: AC
  Filled 2015-01-31: qty 5

## 2015-01-31 MED ORDER — LIDOCAINE HCL 1 % IJ SOLN
INTRAMUSCULAR | Status: DC | PRN
  Administered 2015-01-31: 40 mg via INTRADERMAL

## 2015-01-31 MED ORDER — LACTATED RINGERS IR SOLN
Status: DC | PRN
  Administered 2015-01-31 (×3): 3000 mL

## 2015-01-31 MED ORDER — DEXAMETHASONE SODIUM PHOSPHATE 4 MG/ML IJ SOLN
4.0000 mg | Freq: Once | INTRAMUSCULAR | Status: AC
  Administered 2015-01-31: 4 mg via INTRAVENOUS

## 2015-01-31 MED ORDER — GLYCOPYRROLATE 0.2 MG/ML IJ SOLN
INTRAMUSCULAR | Status: AC
  Filled 2015-01-31: qty 1

## 2015-01-31 SURGICAL SUPPLY — 61 items
BAG HAMPER (MISCELLANEOUS) ×3 IMPLANT
BANDAGE ELASTIC 6 VELCRO NS (GAUZE/BANDAGES/DRESSINGS) ×3 IMPLANT
BANDAGE ESMARK 4X12 BL STRL LF (DISPOSABLE) ×1 IMPLANT
BLADE SURG 15 STRL LF DISP TIS (BLADE) ×1 IMPLANT
BLADE SURG 15 STRL SS (BLADE) ×3
BLADE SURG SZ11 CARB STEEL (BLADE) ×3 IMPLANT
BNDG CMPR 12X4 ELC STRL LF (DISPOSABLE) ×1
BNDG ESMARK 4X12 BLUE STRL LF (DISPOSABLE) ×3
CLOTH BEACON ORANGE TIMEOUT ST (SAFETY) ×3 IMPLANT
CUFF TOURNIQUET SINGLE 34IN LL (TOURNIQUET CUFF) IMPLANT
CUFF TOURNIQUET SINGLE 44IN (TOURNIQUET CUFF) ×2 IMPLANT
CUTTER TOMCAT 4.0M (BURR) ×3 IMPLANT
DECANTER SPIKE VIAL GLASS SM (MISCELLANEOUS) ×3 IMPLANT
DRAPE PROXIMA HALF (DRAPES) ×3 IMPLANT
DRSG XEROFORM 1X8 (GAUZE/BANDAGES/DRESSINGS) ×3 IMPLANT
DURAPREP 26ML APPLICATOR (WOUND CARE) ×6 IMPLANT
ELECT MENISCUS 165MM 90D (ELECTRODE) IMPLANT
FORMALIN 10 PREFIL 480ML (MISCELLANEOUS) ×3 IMPLANT
GAUZE SPONGE 4X4 12PLY STRL (GAUZE/BANDAGES/DRESSINGS) ×2 IMPLANT
GAUZE SPONGE 4X4 16PLY XRAY LF (GAUZE/BANDAGES/DRESSINGS) ×3 IMPLANT
GAUZE XEROFORM 5X9 LF (GAUZE/BANDAGES/DRESSINGS) ×2 IMPLANT
GLOVE BIO SURGEON STRL SZ8 (GLOVE) ×3 IMPLANT
GLOVE BIO SURGEON STRL SZ8.5 (GLOVE) ×3 IMPLANT
GLOVE BIOGEL PI IND STRL 7.0 (GLOVE) IMPLANT
GLOVE BIOGEL PI INDICATOR 7.0 (GLOVE) ×2
GLOVE EXAM NITRILE LRG STRL (GLOVE) ×2 IMPLANT
GLOVE SS BIOGEL STRL SZ 6.5 (GLOVE) IMPLANT
GLOVE SUPERSENSE BIOGEL SZ 6.5 (GLOVE) ×2
GOWN STRL REUS W/TWL LRG LVL3 (GOWN DISPOSABLE) ×3 IMPLANT
GOWN STRL REUS W/TWL XL LVL3 (GOWN DISPOSABLE) ×3 IMPLANT
HANDPIECE (MISCELLANEOUS) IMPLANT
HLDR LEG FOAM (MISCELLANEOUS) ×1 IMPLANT
IV LACTATED RINGER IRRG 3000ML (IV SOLUTION) ×9
IV LR IRRIG 3000ML ARTHROMATIC (IV SOLUTION) ×2 IMPLANT
KIT BLADEGUARD II DBL (SET/KITS/TRAYS/PACK) ×3 IMPLANT
KIT ROOM TURNOVER AP CYSTO (KITS) ×3 IMPLANT
KNIFE HOOK (BLADE) IMPLANT
KNIFE MENISECTOMY (BLADE) IMPLANT
LEG HOLDER FOAM (MISCELLANEOUS) ×2
MANIFOLD NEPTUNE II (INSTRUMENTS) ×3 IMPLANT
MARKER SKIN DUAL TIP RULER LAB (MISCELLANEOUS) ×3 IMPLANT
NDL HYPO 21X1.5 SAFETY (NEEDLE) ×1 IMPLANT
NDL SPNL 18GX3.5 QUINCKE PK (NEEDLE) ×1 IMPLANT
NEEDLE HYPO 21X1.5 SAFETY (NEEDLE) ×3 IMPLANT
NEEDLE SPNL 18GX3.5 QUINCKE PK (NEEDLE) ×3 IMPLANT
NS IRRIG 1000ML POUR BTL (IV SOLUTION) ×3 IMPLANT
PACK ARTHRO LIMB DRAPE STRL (MISCELLANEOUS) ×1 IMPLANT
PAD ABD 5X9 TENDERSORB (GAUZE/BANDAGES/DRESSINGS) ×3 IMPLANT
PAD ARMBOARD 7.5X6 YLW CONV (MISCELLANEOUS) ×3 IMPLANT
PADDING CAST COTTON 6X4 STRL (CAST SUPPLIES) ×3 IMPLANT
PADDING WEBRIL 6 STERILE (GAUZE/BANDAGES/DRESSINGS) ×2 IMPLANT
SET ARTHROSCOPY INST (INSTRUMENTS) ×3 IMPLANT
SET BASIN LINEN APH (SET/KITS/TRAYS/PACK) ×3 IMPLANT
SPONGE GAUZE 4X4 12PLY (GAUZE/BANDAGES/DRESSINGS) ×2 IMPLANT
SUT ETHILON 3 0 FSL (SUTURE) ×3 IMPLANT
SYR 30ML LL (SYRINGE) ×3 IMPLANT
TIP 0 DEGREE (MISCELLANEOUS) IMPLANT
TIP 30 DEGREE (MISCELLANEOUS) IMPLANT
TIP 70 DEGREE (MISCELLANEOUS) IMPLANT
TUBING FLOSTEADY ARTHRO PUMP (IRRIGATION / IRRIGATOR) ×3 IMPLANT
YANKAUER SUCT BULB TIP 10FT TU (MISCELLANEOUS) ×6 IMPLANT

## 2015-01-31 NOTE — Transfer of Care (Signed)
Immediate Anesthesia Transfer of Care Note  Patient: Melvin Turner  Procedure(s) Performed: Procedure(s): RIGHT KNEE ARTHROSCOPY, PARTIAL MEDIAL MENISECTOMY (Right)  Patient Location: PACU  Anesthesia Type:General  Level of Consciousness: awake  Airway & Oxygen Therapy: Patient Spontanous Breathing and Patient connected to face mask oxygen  Post-op Assessment: Report given to RN  Post vital signs: Reviewed and stable  Last Vitals:  Filed Vitals:   01/31/15 0840  BP: 120/72  Temp:   Resp: 20    Complications: No apparent anesthesia complications

## 2015-01-31 NOTE — Op Note (Signed)
Melvin Turner, Melvin Turner                  ACCOUNT NO.:  [id number]  MEDICAL RECORD NO.:  [id number]  LOCATION:  [facility]                          FACILITY:  [facility]  PHYSICIAN:  J. Darreld Mclean, M.D. DATE OF BIRTH:  [birth year]  DATE OF PROCEDURE:  01/31/2015 DATE OF DISCHARGE:  01/31/2015                              OPERATIVE REPORT   PREOPERATIVE DIAGNOSES:  Tear of medial meniscus of the right knee, degenerative joint disease.  POSTOPERATIVE DIAGNOSES:  Tear of medial meniscus of the right knee, degenerative joint disease.  PROCEDURE:  Operative arthroscopy, partial medial meniscectomy of the right knee.  ANESTHESIA:  General.  TOURNIQUET TIME:  20 minutes.  DRAINS:  No drains.  SURGEON:  J. Darreld Mclean, M.D.  INDICATIONS:  The patient is a 59 year old male with pain and tenderness in his knee that got progressively worse over the last several months, particularly last several weeks.  He has been having pain in the knee since before Thanksgiving.  I saw him in November, however, in the last few weeks his knee has been giving way, swelling, painful, tender.  He had an MRI done at [facility] that showed tear of the medial meniscus plus severe degenerative joint disease in the medial side of his knee. He will be a candidate for total knee in the future.  I explained to him the findings and recommended arthroscopy at this time.  I went over the risks and imponderables.  He appeared to understand, agreed with the procedure.  He elected the procedure done today as an outpatient as scheduled.  DESCRIPTION OF PROCEDURE:  The patient was seen in the holding area and the right knee was identified as the correct surgical site.  He placed a mark on the right knee, I placed a mark on the right knee.  The patient was brought to the operating room and placed supine on the operating room table.  Leg holder and tourniquet placed and deflated.  He was given general anesthesia.  I had  generalized time-out and identified the patient as Melvin Turner, we are doing his right knee for arthroscopy.  All instrumentation were properly positioned and working.  The OR team knew each other. Previously applied mark on the knee was still present after prep and drape.  The leg was elevated, wrapped circumferentially with an Esmarch bandage and tourniquet inflated to 300 mmHg.  Inflow cannula inserted medially on the right knee.  Lactated Ringer's instilled into the knee by an infusion pump.  Arthroscope inserted laterally, the knee systematically examined.  Permanent pictures were taken.  Suprapatellar pouch and synovitis.  Undersurface of the patella had grade 2 to early grade 3 changes.  Medially, he had grade 4 changes on the tibial plateau, more medially and a stellate diffuse tear of the meniscus.  He had grade 3 changes on the femoral condyle.  Anterior cruciate was intact. Laterally, it looked very good with a slight fraying.  For some reason at the end of the case when I reviewed the pictures, the lateral pictures for some reason did not show up.  Attention was directed back to the medial side of the knee.  Using a meniscal  punch and meniscal shaver, good smooth contour was obtained of the tear.  The knee was systematically reexamined.  There were no loose fragments and no loose bodies.  The knee was irrigated with remaining part of Lactated Ringer's.  Wounds were reapproximated using 3-0 nylon interrupted vertical mattress manner.  Marcaine 0.25% was instilled into each portal.  Tourniquet deflated after 20 minutes.  Sterile dressing applied.  The patient will go to recovery in good condition.  He will get appropriate analgesia for pain.  I will see him in my office in approximately 10 days to 2 weeks.  He will have physical therapy.  If any difficulties, contact me through the office by hospital beeper system.          ______________________________ Shela CommonsJ. Darreld McleanWayne Gaynelle Turner,  M.D.     JWK/MEDQ  D:  01/31/2015  T:  01/31/2015  Job:  [id number]

## 2015-01-31 NOTE — Progress Notes (Signed)
The History and Physical is unchanged. I have examined the patient. The patient is medically able to have surgery on the right knee . Brissa Asante 

## 2015-01-31 NOTE — Anesthesia Postprocedure Evaluation (Signed)
  Anesthesia Post-op Note  Patient: Melvin Turner  Procedure(s) Performed: Procedure(s): RIGHT KNEE ARTHROSCOPY, PARTIAL MEDIAL MENISECTOMY (Right)  Patient Location: PACU  Anesthesia Type:General  Level of Consciousness: awake, alert  and oriented  Airway and Oxygen Therapy: Patient Spontanous Breathing  Post-op Pain: mild  Post-op Assessment: Post-op Vital signs reviewed, Patient's Cardiovascular Status Stable, Respiratory Function Stable, Patent Airway and No signs of Nausea or vomiting  Post-op Vital Signs: Reviewed and stable  Last Vitals:  Filed Vitals:   01/31/15 0840  BP: 120/72  Temp:   Resp: 20    Complications: No apparent anesthesia complications

## 2015-01-31 NOTE — Anesthesia Procedure Notes (Signed)
Procedure Name: LMA Insertion Performed by: Tamakia Porto E Pre-anesthesia Checklist: Patient identified, Patient being monitored, Emergency Drugs available, Timeout performed and Suction available Patient Re-evaluated:Patient Re-evaluated prior to inductionOxygen Delivery Method: Circle System Utilized Preoxygenation: Pre-oxygenation with 100% oxygen Intubation Type: IV induction Ventilation: Mask ventilation without difficulty LMA: LMA inserted LMA Size: 4.0 Number of attempts: 1 Placement Confirmation: positive ETCO2 and breath sounds checked- equal and bilateral     

## 2015-01-31 NOTE — Anesthesia Preprocedure Evaluation (Addendum)
Anesthesia Evaluation  Patient identified by MRN, date of birth, ID band Patient awake    Reviewed: Allergy & Precautions, NPO status , Patient's Chart, lab work & pertinent test results  Airway Mallampati: II  TM Distance: >3 FB     Dental  (+) Teeth Intact, Dental Advisory Given   Pulmonary former smoker,  breath sounds clear to auscultation        Cardiovascular negative cardio ROS  Rhythm:Regular Rate:Normal     Neuro/Psych    GI/Hepatic negative GI ROS,   Endo/Other    Renal/GU      Musculoskeletal  (+) Arthritis -,   Abdominal   Peds  Hematology   Anesthesia Other Findings   Reproductive/Obstetrics                           Anesthesia Physical Anesthesia Plan  ASA: I  Anesthesia Plan: General   Post-op Pain Management:    Induction: Intravenous  Airway Management Planned: LMA  Additional Equipment:   Intra-op Plan:   Post-operative Plan: Extubation in OR  Informed Consent: I have reviewed the patients History and Physical, chart, labs and discussed the procedure including the risks, benefits and alternatives for the proposed anesthesia with the patient or authorized representative who has indicated his/her understanding and acceptance.     Plan Discussed with:   Anesthesia Plan Comments:         Anesthesia Quick Evaluation

## 2015-01-31 NOTE — Discharge Instructions (Signed)
Use crutches or walker. Use ice to the right knee.  You may bear weight on the right as tolerated.  You may remove dressing on right knee when you desire. You may cleanse wounds with peroxide, otherwise keep the wounds dry.  Apply Band-aids over each of the stitches.  Keep physical therapy appointment.  See Dr. Hilda LiasKeeling in ten days to two weeks.  Call his office at [phone] if any problem or if after hours, the hospital at [phone].

## 2015-01-31 NOTE — Brief Op Note (Signed)
01/31/2015  9:32 AM  PATIENT:  Melvin Turner  59 y.o. male  PRE-OPERATIVE DIAGNOSIS:  tear medial meniscus right knee  POST-OPERATIVE DIAGNOSIS:  tear medial meniscus right knee  PROCEDURE:  Procedure(s): ARTHROSCOPY KNEE (Right), partial medial menisectomy  SURGEON:  Surgeon(s) and Role:    * Darreld McleanWayne Cullan Launer, MD - Primary  PHYSICIAN ASSISTANT:   ASSISTANTS: none   ANESTHESIA:   general  EBL:     BLOOD ADMINISTERED:none  DRAINS: none   LOCAL MEDICATIONS USED:  MARCAINE     SPECIMEN:  Source of Specimen:  right knee medial meniscus shavings  DISPOSITION OF SPECIMEN:  PATHOLOGY  COUNTS:  YES  TOURNIQUET:   Total Tourniquet Time Documented: Thigh (Right) - 19 minutes Total: Thigh (Right) - 19 minutes   DICTATION: .Other Dictation: Dictation Number [id number]  PLAN OF CARE: Discharge to home after PACU  PATIENT DISPOSITION:  PACU - hemodynamically stable.   Delay start of Pharmacological VTE agent (>24hrs) due to surgical blood loss or risk of bleeding: not applicable

## 2015-02-02 ENCOUNTER — Encounter (HOSPITAL_COMMUNITY): Payer: Self-pay | Admitting: Orthopaedic Surgery

## 2015-02-06 ENCOUNTER — Ambulatory Visit (HOSPITAL_COMMUNITY): Payer: Self-pay | Attending: Orthopaedic Surgery | Admitting: Physical Therapy

## 2015-02-06 DIAGNOSIS — M25562 Pain in left knee: Secondary | ICD-10-CM | POA: Insufficient documentation

## 2015-02-06 DIAGNOSIS — R262 Difficulty in walking, not elsewhere classified: Secondary | ICD-10-CM | POA: Insufficient documentation

## 2015-02-06 DIAGNOSIS — R29898 Other symptoms and signs involving the musculoskeletal system: Secondary | ICD-10-CM | POA: Insufficient documentation

## 2015-02-06 DIAGNOSIS — M25662 Stiffness of left knee, not elsewhere classified: Secondary | ICD-10-CM | POA: Insufficient documentation

## 2015-02-06 NOTE — Therapy (Addendum)
[facility] [address] [place], [place], [address] Phone: [phone]   Fax:  [phone]  Physical Therapy Evaluation  Patient Details  Name: Melvin Turner MRN: [id number] Date of Birth: 11/07/1956 Referring Provider:  Darreld Mclean, MD  Encounter Date: 02/06/2015      PT End of Session - 02/06/15 1158    Visit Number 1   Number of Visits 4   Date for PT Re-Evaluation 04/07/15   Authorization Type none   PT Start Time 1100   PT Stop Time 1150   PT Time Calculation (min) 50 min   Activity Tolerance Patient tolerated treatment well   Behavior During Therapy [facility] for tasks assessed/performed      Past Medical History  Diagnosis Date  . Arthritis     Past Surgical History  Procedure Laterality Date  . Colonoscopy    . Vasectomy    . Knee arthroscopy Right 01/31/2015    Procedure: RIGHT KNEE ARTHROSCOPY, PARTIAL MEDIAL MENISECTOMY;  Surgeon: Darreld Mclean, MD;  Location: [facility];  Service: Orthopedics;  Laterality: Right;    There were no vitals filed for this visit.  Visit Diagnosis:  Stiffness of knee joint, Right s/p arthroscopic surgery  Right leg weakness  Pain in knee joint, right  Difficulty walking      Subjective Assessment - 02/06/15 1111    Symptoms Pt states that he has had knee pain for years after falling off a ladder  but his knee begain to give way on him in January of this year therefore he opted to have arthroscopic surgery.  He is being referred to PT to maximiize his functional ability.   Pertinent History N/A   How long can you sit comfortably? 5-10 minutes   How long can you stand comfortably? 30 minutes   How long can you walk comfortably? walking with a cane for about 30 minutes    Patient Stated Goals To be able to walk without the cane, to be able to be up on his leg for several hours, to have less pain    Currently in Pain? Yes   Pain Score 4    Pain Location Knee   Pain Orientation  Right;Posterior   Pain Descriptors / Indicators Aching   Pain Type Surgical pain   Pain Onset More than a month ago   Pain Frequency Constant   Aggravating Factors  not moving   Pain Relieving Factors walking, pain medication             [facility] PT Assessment - 02/06/15 1119    Assessment   Medical Diagnosis s/p Rt arthroscopic surgery   Onset Date 01/31/15   Next MD Visit 02/14/2015   Prior Therapy none   Precautions   Precautions None   Required Braces or Orthoses --  none   Restrictions   Weight Bearing Restrictions No   Balance Screen   Has the patient fallen in the past 6 months No   Has the patient had a decrease in activity level because of a fear of falling?  Yes   Is the patient reluctant to leave their home because of a fear of falling?  No   Home Environment   Living Enviornment Private residence   Living Arrangements Spouse/significant other   Type of Home House   Home Access Stairs to enter   Entrance Stairs-Number of Steps 3 small  was one larger step but pt built small steps for ease   Home  Layout One level   Home Equipment Crutches;Cane - single point   Prior Function   Vocation Full time employment   Vocation Requirements drives tractor; climb, squat    Leisure rides bicycle and motorcycle    Cognition   Overall Cognitive Status Within Functional Limits for tasks assessed   Observation/Other Assessments   Focus on Therapeutic Outcomes (FOTO)  48   AROM   Right Knee Extension -16   Right Knee Flexion 120   Strength   Right Hip Flexion 5/5   Right Hip Extension 3/5   Right Hip ABduction 5/5   Right Knee Flexion 3+/5   Right Knee Extension 3-/5   Right/Left Ankle Right   Right Ankle Dorsiflexion 4+/5   Standardized Balance Assessment   Standardized Balance Assessment --  SLS:  Rt 17 seconds; Lt 8 seconds                    [facility] Adult PT Treatment/Exercise - 02/06/15 0001    Exercises   Exercises Knee/Hip   Knee/Hip Exercises:  Stretches   Active Hamstring Stretch 3 reps;30 seconds   Knee/Hip Exercises: Seated   Long Arc Quad 10 reps   Knee/Hip Exercises: Supine   Quad Sets 10 reps   Heel Slides Right;10 reps   Terminal Knee Extension Right;10 reps   Straight Leg Raises 5 reps   Knee/Hip Exercises: Prone   Hamstring Curl 5 reps   Hip Extension 10 reps                PT Education - 02/06/15 1152    Education provided Yes   Education Details HEP for strengthening and stretching    Person(s) Educated Patient   Methods Explanation;Handout   Comprehension Verbalized understanding;Returned demonstration     .PTgoals     PT Short Term Goals - 02/06/15 1649    PT SHORT TERM GOAL #1   Title I HEP   Baseline not exercising   Time 3   Period Days   Status New   PT SHORT TERM GOAL #2   Title ROM of knee extension to be improved to 10 degrees or less to allow more normalized gt    Time 2   Period Weeks   Status New   PT SHORT TERM GOAL #3   Title Pt to be able to sit with comfort for an hour to allow travel/ eating out at a restaurant   Time 2   Period Weeks   Status New   PT SHORT TERM GOAL #4   Title Pt to be able to stand on his leg for 45 minutes without increased pain to allow socializing   Time 2   Period Weeks   Status New   PT SHORT TERM GOAL #5   Title Pain level no greater than a 2 80% of the timel   Time 2   Period Weeks   Status New           PT Long Term Goals - 02/06/15 1650    PT LONG TERM GOAL #1   Title I in advance HEP   Time 4   Period Weeks   Status New   PT LONG TERM GOAL #2   Title Pt extension of his knee to be less than four degrees to allow normal heel toe gt   Time 4   Period Weeks   Status New   PT LONG TERM GOAL #3   Title Pt to be comfortable  sitting for two hours for travel or watching a movie   Time 4   Period Weeks   Status New   PT LONG TERM GOAL #4   Title Pt pain to be at a 0 80% of the day    Time 4   PT LONG TERM GOAL #5   Time 4    Period Weeks   Status New   PT LONG TERM GOAL #6   Title PT to be able to walk without an assistive device x 2 hrs for shopping/job duties    Time 4   Period Weeks   PT LONG TERM GOAL #7   Title Pt to be able to SLS on B LE for at least 30 seconds to reduce risk of falls    Time 4   Period Weeks               Plan - 02/06/15 1159    Clinical Impression Statement Mr. Jawad is a 59 yo male who has had recent arthroscopic surgery for a Rt meniscus repair.  He is being referred to therapy to improve his functional level so that he may return to work.  Examination demonstrates decreased ROM, decreased strength , decreased balance and increased pain.  Mr. Mahler will benefit from skilled PT to address these issues and return his to a maximal functioning level.    Pt will benefit from skilled therapeutic intervention in order to improve on the following deficits Abnormal gait;Decreased balance;Decreased range of motion;Decreased strength;Difficulty walking;Pain   Rehab Potential Good   PT Frequency 1x / week   PT Duration 4 weeks   PT Treatment/Interventions ADLs/Self Care Home Management;Gait training;Stair training;Functional mobility training;Therapeutic activities;Therapeutic exercise;Balance training   PT Next Visit Plan begin heel raise, functional squat, lateral step ups, forward step ups, standing knee flexion, SLS, forward lunging.  Pt will need all of these put on a HEP as he is only coming one time a week do to no insurance.    PT Home Exercise Plan given for NWB exercises.    Consulted and Agree with Plan of Care Patient         Problem List There are no active problems to display for this patient. Virgina Organ PT [phone]  02/06/2015, 4:51 PM  [facility] [address] [place], [place], [address] Phone: [phone]   Fax:  [phone]

## 2015-02-06 NOTE — Patient Instructions (Signed)
Knee Extension (Sitting)   Place _0___ pound weight on left ankle and straighten knee fully, lower slowly. Repeat _10___ times per set. Do ___1_ sets per session. Do __3__ sessions per day.  http://orth.exer.us/732   Copyright  VHI. All rights reserved.  Strengthening: Quadriceps Set   Tighten muscles on top of thighs by pushing knees down into surface. Hold _5___ seconds. Repeat _10___ times per set. Do _1___ sets per session. Do ___3_ sessions per day.  http://orth.exer.us/602   Copyright  VHI. All rights reserved.  Stretching: Hamstring (Supine)   Supporting right thigh behind knee, slowly straighten knee until stretch is felt in back of thigh. Hold __30__ seconds. Repeat __3__ times per set. Do __1__ sets per session. Do __3__ sessions per day.  http://orth.exer.us/656   Copyright  VHI. All rights reserved.  Self-Mobilization: Heel Slide (Supine)   Slide left heel toward buttocks until a gentle stretch is felt. Hold _5___ seconds. Relax. Repeat _10___ times per set. Do ___1_ sets per session. Do ___3_ sessions per day.  http://orth.exer.us/710   Copyright  VHI. All rights reserved.  Self-Mobilization: Knee Flexion (Prone)   Bring left heel toward buttocks as close as possible. Hold _5___ seconds. Relax. Repeat _10___ times per set. Do __1__ sets per session. Do ___3_ sessions per day.  http://orth.exer.us/596   Copyright  VHI. All rights reserved.  Straight Leg Raise (Prone)   Abdomen and head supported, keep left knee locked and raise leg at hip. Avoid arching low back. Repeat __10__ times per set. Do _1___ sets per session. Do ____3 sessions per day.  http://orth.exer.us/1112   C Copyright  VHI. All rights reserved.

## 2015-02-08 NOTE — Addendum Note (Signed)
Addended by: Bella KennedyUSSELL, CYNTHIA J on: 02/08/2015 12:56 PM   Modules accepted: Orders

## 2015-02-15 ENCOUNTER — Ambulatory Visit (HOSPITAL_COMMUNITY): Payer: Self-pay | Admitting: Physical Therapy

## 2015-02-15 DIAGNOSIS — M25661 Stiffness of right knee, not elsewhere classified: Secondary | ICD-10-CM

## 2015-02-15 DIAGNOSIS — R262 Difficulty in walking, not elsewhere classified: Secondary | ICD-10-CM

## 2015-02-15 NOTE — Therapy (Signed)
[facility] [address] [place], [place], [address] Phone: [phone]   Fax:  [phone]  Physical Therapy Treatment  Patient Details  Name: Melvin Turner MRN: [id number] Date of Birth: 1956-02-09 Referring Provider:  Darreld McleanKeeling, Wayne, MD  Encounter Date: 02/15/2015      PT End of Session - 02/15/15 1142    Visit Number 2   Number of Visits 4   Date for PT Re-Evaluation 04/07/15   Authorization Type none   PT Start Time 1103   PT Stop Time 1143   PT Time Calculation (min) 40 min   Activity Tolerance Patient tolerated treatment well   Behavior [facility] for tasks assessed/performed      Past Medical History  Diagnosis Date  . Arthritis     Past Surgical History  Procedure Laterality Date  . Colonoscopy    . Vasectomy    . Knee arthroscopy Right 01/31/2015    Procedure: RIGHT KNEE ARTHROSCOPY, PARTIAL MEDIAL MENISECTOMY;  Surgeon: Darreld McleanWayne Keeling, MD;  Location: [facility];  Service: Orthopedics;  Laterality: Right;    There were no vitals filed for this visit.  Visit Diagnosis:  Stiffness of knee joint, right  Difficulty walking      Subjective Assessment - 02/15/15 1159    Symptoms Pt states he returns to work on Monday.  States he has mostly stiffness in his knee, no real pain.  Reports compliance with HEP.   Currently in Pain? No/denies                       [facility] - 02/15/15 1107    Knee/Hip Exercises: Stretches   Active Hamstring Stretch 3 reps;30 seconds   Active Hamstring Stretch Limitations 14" box   Gastroc Stretch 3 reps;30 seconds   Knee/Hip Exercises: Aerobic   Stationary Bike 8 minutes   Knee/Hip Exercises: Standing   Heel Raises 10 reps   Heel Raises Limitations toeraises 10 reps   Knee Flexion 10 reps   Forward Lunges 10 reps   Forward Lunges Limitations 4" box   SLS bilateral max 22"Rt, 45" LT   Knee/Hip Exercises: Seated   Long Arc Quad 10 reps   Knee/Hip Exercises: Supine   Quad Sets 10 reps   Heel Slides Right;10 reps   Straight Leg Raises 10 reps   Knee/Hip Exercises: Sidelying   Hip ABduction 10 reps   Knee/Hip Exercises: Prone   Hamstring Curl 10 reps   Hip Extension 10 reps                  PT Short Term Goals - 02/06/15 1649    PT SHORT TERM GOAL #1   Title I HEP   Baseline not exercising   Time 3   Period Days   Status New   PT SHORT TERM GOAL #2   Title ROM of knee extension to be improved to 10 degrees or less to allow more normalized gt    Time 2   Period Weeks   Status New   PT SHORT TERM GOAL #3   Title Pt to be able to sit with comfort for an hour to allow travel/ eating out at a restaurant   Time 2   Period Weeks   Status New   PT SHORT TERM GOAL #4   Title Pt to be able to stand on his leg for 45 minutes without increased pain to allow socializing  Time 2   Period Weeks   Status New   PT SHORT TERM GOAL #5   Title Pain level no greater than a 2 80% of the timel   Time 2   Period Weeks   Status New           PT Long Term Goals - 02/06/15 1650    PT LONG TERM GOAL #1   Title I in advance HEP   Time 4   Period Weeks   Status New   PT LONG TERM GOAL #2   Title Pt extension of his knee to be less than four degrees to allow normal heel toe gt   Time 4   Period Weeks   Status New   PT LONG TERM GOAL #3   Title Pt to be comfortable sitting for two hours for travel or watching a movie   Time 4   Period Weeks   Status New   PT LONG TERM GOAL #4   Title Pt pain to be at a 0 80% of the day    Time 4   PT LONG TERM GOAL #5   Time 4   Period Weeks   Status New   PT LONG TERM GOAL #6   Title PT to be able to walk without an assistive device x 2 hrs for shopping/job duties    Time 4   Period Weeks   PT LONG TERM GOAL #7   Title Pt to be able to SLS on B LE for at least 30 seconds to reduce risk of falls    Time 4   Period Weeks               Plan - 02/15/15 1154     Clinical Impression Statement Progressed therex per PT POC.  Pt required therapist facilitation to complete exercises in correct form.  HEP updated with new exercises.  NO pain reported during or after session.  PT with noted scar tissue at incision site.  Added manual and instruted to complete scar massage at home.    PT Next Visit Plan Add functional squat, lateral and forward step ups next session..  Pt will need all of these put on a HEP as he is only coming one time a week do to no insurance. Inquire on how work is going and if having any functional limitations.   Consulted and Agree with Plan of Care Patient        Problem List There are no active problems to display for this patient.   Lurena Nida, PTA/CLT [phone] 02/15/2015, 12:01 PM   [facility] [address] [place], [place], [address] Phone: [phone]   Fax:  [phone]

## 2015-02-15 NOTE — Patient Instructions (Addendum)
  Toe StandForward Lunge   Standing with feet shoulder width apart and stomach tight, step forward with left leg. Repeat ___10_ times per set.  Do __2__ sessions per day.  http://orth.exer.us/1146   Copyright  VHI. All rights reserved.  FUNCTIONAL MOBILITY: Squat   Stance: shoulder-width on floor. Bend hips and knees. Keep back straight. Do not allow knees to bend past toes. Squeeze glutes and quads to stand. __10_ reps per set,  _2__ times per day  Copyright  VHI. All rights reserved.  FLEXION: Standing - Stable (Power)   Stand, both feet flat. Bend right knee, bringing heel toward buttocks as quickly as possible. Use __0_ lbs. Complete _10__ sets of ___ repetitions. Perform __2_ sessions per day.  Copyright  VHI. All rights reserved.     Using support, stand on toes _3__ seconds or as long as possible. Repeat on one foot if possible. Repeat _10___ times. Do ___2_ sessions per day.  http://gt2.exer.us/517   Copyright  VHI. All rights reserved.

## 2015-02-21 ENCOUNTER — Ambulatory Visit (HOSPITAL_COMMUNITY): Payer: Self-pay | Admitting: Physical Therapy

## 2015-02-27 ENCOUNTER — Ambulatory Visit (HOSPITAL_COMMUNITY): Payer: Self-pay | Attending: Orthopaedic Surgery | Admitting: Physical Therapy

## 2015-02-27 DIAGNOSIS — M25662 Stiffness of left knee, not elsewhere classified: Secondary | ICD-10-CM | POA: Insufficient documentation

## 2015-02-27 DIAGNOSIS — M25661 Stiffness of right knee, not elsewhere classified: Secondary | ICD-10-CM

## 2015-02-27 DIAGNOSIS — M25562 Pain in left knee: Secondary | ICD-10-CM | POA: Insufficient documentation

## 2015-02-27 DIAGNOSIS — R29898 Other symptoms and signs involving the musculoskeletal system: Secondary | ICD-10-CM | POA: Insufficient documentation

## 2015-02-27 DIAGNOSIS — R262 Difficulty in walking, not elsewhere classified: Secondary | ICD-10-CM | POA: Insufficient documentation

## 2015-02-27 NOTE — Therapy (Signed)
[facility] [address] [place], [place], [address] Phone: [phone]   Fax:  [phone]  Physical Therapy Treatment  Patient Details  Name: Melvin Turner MRN: [id number] Date of Birth: 1956-08-16 Referring Provider:  Darreld McleanKeeling, Wayne, MD  Encounter Date: 02/27/2015      PT End of Session - 02/27/15 0947    Visit Number 3   Number of Visits 4   Date for PT Re-Evaluation 04/07/15   Authorization Type none   PT Start Time 0936   PT Stop Time 1016   PT Time Calculation (min) 40 min   Activity Tolerance Patient tolerated treatment well   Behavior During Therapy [facility] for tasks assessed/performed      Past Medical History  Diagnosis Date  . Arthritis     Past Surgical History  Procedure Laterality Date  . Colonoscopy    . Vasectomy    . Knee arthroscopy Right 01/31/2015    Procedure: RIGHT KNEE ARTHROSCOPY, PARTIAL MEDIAL MENISECTOMY;  Surgeon: Darreld McleanWayne Keeling, MD;  Location: [facility];  Service: Orthopedics;  Laterality: Right;    There were no vitals filed for this visit.  Visit Diagnosis:  Stiffness of knee joint, right  Difficulty walking  Stiffness of knee joint, left  Left leg weakness  Pain in knee joint, left      Subjective Assessment - 02/27/15 0941    Subjective Pt states work is overall going fine, however states he's sitting alot at work. Reports gets really stiff after sitting prolonged or getting up in the am.  Currently 3/10.  Reports he is decreasing his norco pain meds.   Currently in Pain? Yes   Pain Score 3    Pain Location Knee   Pain Orientation Right;Lower;Anterior   Pain Radiating Towards in front of knee cap                       [facility] Adult PT Treatment/Exercise - 02/27/15 0938    Knee/Hip Exercises: Stretches   Active Hamstring Stretch 3 reps;30 seconds   Active Hamstring Stretch Limitations 14" box   Gastroc Stretch 3 reps;30 seconds   Knee/Hip Exercises: Aerobic   Stationary  Bike 10 minutes   Knee/Hip Exercises: Standing   Heel Raises 10 reps   Heel Raises Limitations toeraises 10 reps   Knee Flexion 10 reps   Forward Lunges 10 reps   Forward Lunges Limitations 4" box   Lateral Step Up Right;10 reps;Step Height: 4";Hand Hold: 1   Forward Step Up Right;10 reps;Step Height: 6";Hand Hold: 0   Stairs 7" 2Rt reciprocally without HR   SLS bilateral max 22"Rt, 45" LT   Knee/Hip Exercises: Seated   Long Arc Quad 15 reps   Knee/Hip Exercises: Supine   Quad Sets 15 reps   Bridges 10 reps   Straight Leg Raises 15 reps   Knee/Hip Exercises: Sidelying   Hip ABduction 15 reps   Knee/Hip Exercises: Prone   Hamstring Curl 15 reps   Hip Extension 15 reps                  PT Short Term Goals - 02/27/15 1009    PT SHORT TERM GOAL #1   Title I HEP   Baseline not exercising   Time 3   Period Days   Status Achieved   PT SHORT TERM GOAL #2   Title ROM of knee extension to be improved to 10 degrees or less to allow more  normalized gt    Time 2   Period Weeks   Status Achieved   PT SHORT TERM GOAL #3   Title Pt to be able to sit with comfort for an hour to allow travel/ eating out at a restaurant   Time 2   Period Weeks   Status Achieved   PT SHORT TERM GOAL #4   Title Pt to be able to stand on his leg for 45 minutes without increased pain to allow socializing   Time 2   Period Weeks   Status Achieved   PT SHORT TERM GOAL #5   Title Pain level no greater than a 2 80% of the timel   Time 2   Period Weeks   Status Achieved           PT Long Term Goals - 02/27/15 1010    PT LONG TERM GOAL #1   Title I in advance HEP   Time 4   Period Weeks   Status On-going   PT LONG TERM GOAL #2   Title Pt extension of his knee to be less than four degrees to allow normal heel toe gt   Time 4   Period Weeks   Status On-going   PT LONG TERM GOAL #3   Title Pt to be comfortable sitting for two hours for travel or watching a movie   Time 4   Period  Weeks   Status On-going   PT LONG TERM GOAL #4   Title Pt pain to be at a 0 80% of the day    Time 4   Status On-going   PT LONG TERM GOAL #5   Title Pt strength to be improved one grade to allow pt to feel he will be able to RTW and complete work duties    Time 4   Period Weeks   Status On-going   PT LONG TERM GOAL #6   Title PT to be able to walk without an assistive device x 2 hrs for shopping/job duties    Time 4   Period Weeks   PT LONG TERM GOAL #7   Title Pt to be able to SLS on B LE for at least 30 seconds to reduce risk of falls    Time 4   Period Weeks               Plan - 02/27/15 1191    Clinical Impression Statement Added functional squats, forward and lateral step ups to POC.  Pt able to complete without c/o pain.  Able to negotiate stairs reciprocally without difficulty and  increase reps of therex.     PT Next Visit Plan Re-evaluate next visit.  Ensure patient has advanced, updated HEP.     Consulted and Agree with Plan of Care Patient        Problem List There are no active problems to display for this patient.   Lurena Nida, PTA/CLT [phone]  02/27/2015, 10:11 AM   [facility] [address] [place], [place], [address] Phone: [phone]   Fax:  [phone]

## 2015-03-01 ENCOUNTER — Telehealth (HOSPITAL_COMMUNITY): Payer: Self-pay

## 2015-03-01 NOTE — Telephone Encounter (Signed)
[name] from Dr. Jenetta DownerKeelings office called to cancel all of Mr Melvin Turner appt.

## 2015-03-06 ENCOUNTER — Encounter (HOSPITAL_COMMUNITY): Payer: Self-pay | Admitting: Physical Therapy

## 2015-03-08 ENCOUNTER — Encounter (HOSPITAL_COMMUNITY): Payer: Self-pay | Admitting: Physical Therapy

## 2015-03-13 ENCOUNTER — Encounter (HOSPITAL_COMMUNITY): Payer: Self-pay | Admitting: Physical Therapy

## 2015-05-02 NOTE — Therapy (Signed)
[place], [place], [address] Phone: [phone]   Fax:  [phone]  Patient Details  Name: Melvin Turner MRN: [id number] Date of Birth: 1956-07-06 Referring Provider:  Sanjuana Kava, MD  Encounter Date: 02/27/2015  PHYSICAL THERAPY DISCHARGE SUMMARY  Visits from Start of Care:3 Current functional level related to goals / functional outcomes: Decreased pain, able to sit and stand longer   Remaining deficits: Unable to stand x 2hr   Education / Equipment: HEP  Plan: Patient agrees to discharge.  Patient goals were partially met. Patient is being discharged due to the physician's request.  ?????   Rayetta Humphrey, PT CLT [phone]  05/02/2015, 9:24 AM  [place] [address] [place], [place], [address] Phone: [phone]   Fax:  [phone]

## 2015-11-13 ENCOUNTER — Emergency Department (HOSPITAL_COMMUNITY)
Admission: EM | Admit: 2015-11-13 | Discharge: 2015-11-13 | Disposition: A | Discharge status: Home / Self Care | Payer: Self-pay | Attending: Emergency Medicine | Admitting: Emergency Medicine

## 2015-11-13 ENCOUNTER — Encounter (HOSPITAL_COMMUNITY): Payer: Self-pay | Admitting: *Deleted

## 2015-11-13 DIAGNOSIS — Z79899 Other long term (current) drug therapy: Secondary | ICD-10-CM | POA: Insufficient documentation

## 2015-11-13 DIAGNOSIS — M791 Myalgia: Secondary | ICD-10-CM | POA: Insufficient documentation

## 2015-11-13 DIAGNOSIS — Z8739 Personal history of other diseases of the musculoskeletal system and connective tissue: Secondary | ICD-10-CM | POA: Insufficient documentation

## 2015-11-13 DIAGNOSIS — L03114 Cellulitis of left upper limb: Secondary | ICD-10-CM | POA: Insufficient documentation

## 2015-11-13 DIAGNOSIS — Z87891 Personal history of nicotine dependence: Secondary | ICD-10-CM | POA: Insufficient documentation

## 2015-11-13 LAB — BASIC METABOLIC PANEL
Anion gap: 6 (ref 5–15)
BUN: 15 mg/dL (ref 6–20)
CO2: 27 mmol/L (ref 22–32)
Calcium: 9.6 mg/dL (ref 8.9–10.3)
Chloride: 109 mmol/L (ref 101–111)
Creatinine, Ser: 0.91 mg/dL (ref 0.61–1.24)
GFR calc Af Amer: >60 mL/min (ref >60)
GFR calc non Af Amer: >60 mL/min (ref >60)
Glucose, Bld: 132 mg/dL — ABNORMAL HIGH (ref 65–99)
Potassium: 4.2 mmol/L (ref 3.5–5.1)
Sodium: 142 mmol/L (ref 135–145)

## 2015-11-13 LAB — CBC WITH DIFFERENTIAL/PLATELET
Basophils Absolute: 0 10*3/uL (ref 0.0–0.1)
Basophils Relative: 0 %
Eosinophils Absolute: 0.1 10*3/uL (ref 0.0–0.7)
Eosinophils Relative: 1 %
HCT: 42.1 % (ref 39.0–52.0)
Hemoglobin: 14 g/dL (ref 13.0–17.0)
Lymphocytes Relative: 25 %
Lymphs Abs: 3.4 10*3/uL (ref 0.7–4.0)
MCH: 31 pg (ref 26.0–34.0)
MCHC: 33.3 g/dL (ref 30.0–36.0)
MCV: 93.1 fL (ref 78.0–100.0)
Monocytes Absolute: 0.9 10*3/uL (ref 0.1–1.0)
Monocytes Relative: 7 %
Neutro Abs: 9.1 10*3/uL — ABNORMAL HIGH (ref 1.7–7.7)
Neutrophils Relative %: 67 %
Platelets: 288 10*3/uL (ref 150–400)
RBC: 4.52 MIL/uL (ref 4.22–5.81)
RDW: 13.2 % (ref 11.5–15.5)
WBC: 13.6 10*3/uL — ABNORMAL HIGH (ref 4.0–10.5)

## 2015-11-13 MED ORDER — SULFAMETHOXAZOLE-TRIMETHOPRIM 800-160 MG PO TABS: 1.0000 | ORAL_TABLET | Freq: Two times a day (BID) | ORAL | Status: AC

## 2015-11-13 MED ORDER — VANCOMYCIN HCL IN DEXTROSE 1-5 GM/200ML-% IV SOLN
1000.0000 mg | Freq: Once | INTRAVENOUS | Status: AC
  Administered 2015-11-13: 1000 mg via INTRAVENOUS
  Filled 2015-11-13: qty 200

## 2015-11-13 NOTE — ED Provider Notes (Signed)
CSN: [id number]     Arrival date & time 11/13/15  1424 History  By signing my name below, I, [facility], attest that this documentation has been prepared under the direction and in the presence of Rhoda Waldvogel, PA-C. Electronically Signed: Randell Patient, ED Scribe. 11/13/2015. 3:27 PM.   Chief Complaint  Patient presents with  . Cellulitis   The history is provided by the patient. No language interpreter was used.   HPI Comments: Melvin Turner is a 59 y.o. male who presents to the Emergency Department complaining of left arm pain, near the elbow, onset 3 days ago. Patient reports that he works in a [employer] and has been bitten by spiders, but denies known insect bites or injuries. He states that he noticed a sore bump on his lateral left forearm 3 days ago that he squeezed, producing blood from the site, followed by an increase in the pain and swelling and the appearance of redness in the skin surrounding the bump. He endorses general malaise. Pain is unchanged by anything. Per patient, he tried Neosporin with no relief. He denies hx of DM. Patient denies fever, chills, and numbness of the fingers.  He describes a "tigntness" of her forearm, but denies pain with movement of the wrist or elbow.    Past Medical History  Diagnosis Date  . Arthritis    Past Surgical History  Procedure Laterality Date  . Colonoscopy    . Vasectomy    . Knee arthroscopy Right 01/31/2015    Procedure: RIGHT KNEE ARTHROSCOPY, PARTIAL MEDIAL MENISECTOMY;  Surgeon: Darreld Mclean, MD;  Location: [facility];  Service: Orthopedics;  Laterality: Right;   No family history on file. Social History  Substance Use Topics  . Smoking status: Former Smoker -- 1.00 packs/day for 30 years    Types: Cigarettes    Quit date: 01/29/2006  . Smokeless tobacco: None  . Alcohol Use: Yes     Comment: occ    Review of Systems  Constitutional: Negative for fever and chills.  Gastrointestinal: Negative for  nausea and vomiting.  Musculoskeletal: Positive for myalgias (Left forearm). Negative for arthralgias.  Skin: Positive for color change (Redness on lateral left forearm near elbow) and wound (Bump on lateral left forearm below elbow).  Neurological: Negative for weakness and numbness.  All other systems reviewed and are negative.   Allergies  Poison ivy extract  Home Medications   Prior to Admission medications   Medication Sig Start Date End Date Taking? Authorizing Provider  ibuprofen (ADVIL,MOTRIN) 200 MG tablet Take 800 mg by mouth every 6 (six) hours as needed. pain    Historical Provider, MD  minocycline (MINOCIN) 100 MG capsule Take 1 capsule (100 mg total) by mouth 2 (two) times daily. 03/10/14   Ivery Quale, PA-C  oxyCODONE-acetaminophen (PERCOCET) 7.5-325 MG per tablet Take 1 tablet by mouth every 4 (four) hours as needed for pain. 12/24/14   Dione Booze, MD   BP 175/93 mmHg  Pulse 94  Temp(Src) 98.8 F (37.1 C) (Oral)  Resp 16  Ht  (1.803 m)  Wt 228 lb (103.42 kg)  BMI 31.81 kg/m2  SpO2 98%   Physical Exam  Constitutional: He is oriented to person, place, and time. He appears well-developed and well-nourished. No distress.  HENT:  Head: Normocephalic and atraumatic.  Eyes: Conjunctivae and EOM are normal.  Neck: Normal range of motion. Neck supple. No tracheal deviation present.  Cardiovascular: Normal rate and regular rhythm.  Exam reveals no gallop  and no friction rub.   Pulmonary/Chest: Effort normal and breath sounds normal. No respiratory distress. He has no wheezes. He has no rales.  Musculoskeletal: Normal range of motion.  Has FROM left elbow and wrist. Grip strengths are strong and symmetrical.  Radial pulse brisk, Distal sensation intact.  Neurological: He is alert and oriented to person, place, and time.  Skin: Skin is warm and dry.  Erythema just proximal to the left elbow that extends anteriorly to the lower forearm. A single open lesion to the  proximal forearm. Diffuse induration without obvious abscess.  Psychiatric: He has a normal mood and affect. His behavior is normal.  Nursing note and vitals reviewed.   ED Course  Procedures   DIAGNOSTIC STUDIES: Oxygen Saturation is 98% on RA, normal by my interpretation.    COORDINATION OF CARE: 3:27 PM Discussed treatment plan with pt at bedside and pt agreed to plan.  Labs Review Labs Reviewed  BASIC METABOLIC PANEL - Abnormal; Notable for the following:    Glucose, Bld 132 (*)    All other components within normal limits  CBC WITH DIFFERENTIAL/PLATELET - Abnormal; Notable for the following:    WBC 13.6 (*)    Neutro Abs 9.1 (*)    All other components within normal limits    Imaging Review No results found. I have personally reviewed and evaluated these images and lab results as part of my medical decision-making.    MDM   Final diagnoses:  Cellulitis of left upper extremity    Patient is well-appearing. Non-toxic.  Vital signs are stable. He continues to have full range of motion of the left elbow and wrist. No concerning sx's for septic joint at this time. Leading edge of the erythema was marked by me. IV vancomycin given here.  Patient agrees to elevate, warm compresses and close follow-up in 1-2 days for recheck. Rx for bactrim  I personally performed the services described in this documentation, which was scribed in my presence. The recorded information has been reviewed and is accurate.   Pauline Ausammy Garnett Nunziata, PA-C 11/13/15 1652  Zadie Rhineonald Wickline, MD 11/14/15 [phone]

## 2015-11-13 NOTE — Discharge Instructions (Signed)
Cellulitis °Cellulitis is an infection of the skin and the tissue under the skin. The infected area is usually red and tender. This happens most often in the arms and lower legs. °HOME CARE  °· Take your antibiotic medicine as told. Finish the medicine even if you start to feel better. °· Keep the infected arm or leg raised (elevated). °· Put a warm cloth on the area up to 4 times per day. °· Only take medicines as told by your doctor. °· Keep all doctor visits as told. °GET HELP IF: °· You see red streaks on the skin coming from the infected area. °· Your red area gets bigger or turns a dark color. °· Your bone or joint under the infected area is painful after the skin heals. °· Your infection comes back in the same area or different area. °· You have a puffy (swollen) bump in the infected area. °· You have new symptoms. °· You have a fever. °GET HELP RIGHT AWAY IF:  °· You feel very sleepy. °· You throw up (vomit) or have watery poop (diarrhea). °· You feel sick and have muscle aches and pains. °  °This information is not intended to replace advice given to you by your health care provider. Make sure you discuss any questions you have with your health care provider. °  °Document Released: 04/22/2008 Document Revised: 07/26/2015 Document Reviewed: 01/20/2012 °Elsevier Interactive Patient Education ©2016 Elsevier Inc. ° °

## 2015-11-13 NOTE — ED Notes (Signed)
Pt has left arm elbow pain, pt has a open wound.  Pt has mild redness moving from his his elbow to his middle forearm. NAD noted.

## 2015-11-15 ENCOUNTER — Emergency Department (HOSPITAL_COMMUNITY)
Admission: EM | Admit: 2015-11-15 | Discharge: 2015-11-15 | Disposition: A | Discharge status: Home / Self Care | Payer: Self-pay | Attending: Emergency Medicine | Admitting: Emergency Medicine

## 2015-11-15 ENCOUNTER — Encounter (HOSPITAL_COMMUNITY): Payer: Self-pay | Admitting: Emergency Medicine

## 2015-11-15 DIAGNOSIS — Z79899 Other long term (current) drug therapy: Secondary | ICD-10-CM | POA: Insufficient documentation

## 2015-11-15 DIAGNOSIS — Z87891 Personal history of nicotine dependence: Secondary | ICD-10-CM | POA: Insufficient documentation

## 2015-11-15 DIAGNOSIS — L03114 Cellulitis of left upper limb: Secondary | ICD-10-CM | POA: Insufficient documentation

## 2015-11-15 DIAGNOSIS — M1990 Unspecified osteoarthritis, unspecified site: Secondary | ICD-10-CM | POA: Insufficient documentation

## 2015-11-15 MED ORDER — CEPHALEXIN 500 MG PO CAPS: 500.0000 mg | ORAL_CAPSULE | Freq: Four times a day (QID) | ORAL | Status: DC

## 2015-11-15 NOTE — ED Provider Notes (Signed)
CSN: [id number]     Arrival date & time 11/15/15  0702 History   First MD Initiated Contact with Patient 11/15/15 [phone]     Chief Complaint  Patient presents with  . Cellulitis     (Consider location/radiation/quality/duration/timing/severity/associated sxs/prior Treatment) HPI   Melvin Turner is a 59 y.o. male here for scheduled recheck of skin infection left arm. He feels like he is getting better. He was able to work, yesterday. He complains of mild soreness in his left arm, with the effort of working. He denies fever, chills, nausea, vomiting, weakness or dizziness. He is not sure how he sustained the infection of his left arm. No prior similar episodes. There are no other known modifying factors.  Past Medical History  Diagnosis Date  . Arthritis    Past Surgical History  Procedure Laterality Date  . Colonoscopy    . Vasectomy    . Knee arthroscopy Right 01/31/2015    Procedure: RIGHT KNEE ARTHROSCOPY, PARTIAL MEDIAL MENISECTOMY;  Surgeon: Darreld McleanWayne Keeling, MD;  Location: [facility];  Service: Orthopedics;  Laterality: Right;   History reviewed. No pertinent family history. Social History  Substance Use Topics  . Smoking status: Former Smoker -- 1.00 packs/day for 30 years    Types: Cigarettes    Quit date: 01/29/2006  . Smokeless tobacco: None  . Alcohol Use: Yes     Comment: occ    Review of Systems  All other systems reviewed and are negative.     Allergies  Poison ivy extract  Home Medications   Prior to Admission medications   Medication Sig Start Date End Date Taking? Authorizing Provider  cephALEXin (KEFLEX) 500 MG capsule Take 1 capsule (500 mg total) by mouth 4 (four) times daily. 11/15/15   Mancel BaleElliott Nely Dedmon, MD  HYDROcodone-acetaminophen (NORCO) 7.5-325 MG tablet Take 1 tablet by mouth every 6 (six) hours as needed for moderate pain.    Historical Provider, MD  Multiple Vitamin (MULTIVITAMIN WITH MINERALS) TABS tablet Take 1 tablet by mouth daily.    Historical  Provider, MD  naproxen (NAPROSYN) 500 MG tablet Take 500 mg by mouth 2 (two) times daily with a meal.    Historical Provider, MD  neomycin-bacitracin-polymyxin (NEOSPORIN) ointment Apply 1 application topically as needed for wound care. apply to eye    Historical Provider, MD  sulfamethoxazole-trimethoprim (BACTRIM DS,SEPTRA DS) 800-160 MG tablet Take 1 tablet by mouth 2 (two) times daily. For 10 days 11/13/15 11/20/15  Tammy Triplett, PA-C   BP 136/89 mmHg  Pulse 88  Temp(Src) 97.9 F (36.6 C) (Oral)  Resp 18  Ht 5\' 11"  (1.803 m)  Wt 225 lb (102.059 kg)  BMI 31.39 kg/m2  SpO2 97% Physical Exam  Constitutional: He is oriented to person, place, and time. He appears well-developed and well-nourished.  HENT:  Head: Normocephalic and atraumatic.  Right Ear: External ear normal.  Left Ear: External ear normal.  Eyes: Conjunctivae and EOM are normal. Pupils are equal, round, and reactive to light.  Neck: Normal range of motion and phonation normal. Neck supple.  Cardiovascular: Normal rate.   Pulmonary/Chest: Effort normal. He exhibits no bony tenderness.  Musculoskeletal: Normal range of motion.  Left arm with erythema, dorsal aspect, almost a circumferential wrap around to the volar aspect. There is normal range of motion. Left shoulder, left elbow and left wrist. There is a skin defect, posterior layer over the olecranon, consistent with a portal for infection. There is no proximal streaking  Neurological: He is alert and  oriented to person, place, and time. No cranial nerve deficit or sensory deficit. He exhibits normal muscle tone. Coordination normal.  Skin: Skin is warm, dry and intact.  Psychiatric: He has a normal mood and affect. His behavior is normal. Judgment and thought content normal.  Nursing note and vitals reviewed.   ED Course  Procedures (including critical care time)  Medications - No data to display  Patient Vitals for the past 24 hrs:  BP Temp Temp src Pulse Resp  SpO2 Height Weight  11/15/15 0710 136/89 mmHg 97.9 F (36.6 C) Oral 88 18 97 %  (1.803 m) 225 lb (102.059 kg)    7:23 AM Reevaluation with update and discussion. After initial assessment and treatment, an updated evaluation reveals findings discussed with the patient, all questions were answered. [name]    Labs Review Labs Reviewed - No data to display  Imaging Review No results found. I have personally reviewed and evaluated these images and lab results as part of my medical decision-making.   EKG Interpretation None      MDM   Final diagnoses:  Cellulitis of left upper extremity    Left arm cellulitis, with clinical improvement based on his assessment. Erythema has gone beyond the margins drawn, 2 days ago. However, this is not unusual when a dependent extremity is involved. He does not have systemic symptoms or proximal spreading erythema. Therefore, I think that he has improved. To ensure continued improvement, I will add a second antibiotic for additional coverage.  Nursing Notes Reviewed/ Care Coordinated Applicable Imaging Reviewed Interpretation of Laboratory Data incorporated into ED treatment  The patient appears reasonably screened and/or stabilized for discharge and I doubt any other medical condition or other [facility] requiring further screening, evaluation, or treatment in the ED at this time prior to discharge.  Plan: Home Medications- Keflex; Home Treatments- warm soaks QID, Light duty for 5 days; return here if the recommended treatment, does not improve the symptoms; Recommended follow up- PCP or here prn    Mancel Bale, MD 11/15/15 [phone]

## 2015-11-15 NOTE — ED Notes (Signed)
Patient states he was seen here on Dec 26 for abscess and had skin marker written on left arm by PA. Redness and warmth has spread outside of skin marker. Patient states he feels better since starting antibiotics, but "she insisted I come back today for a recheck."

## 2015-11-15 NOTE — Discharge Instructions (Signed)
Soaks, left elbow in warm water, 3 times a day for 30 minutes. Return here if not better in 3 days.   Cellulitis Cellulitis is an infection of the skin and the tissue beneath it. The infected area is usually red and tender. Cellulitis occurs most often in the arms and lower legs.  CAUSES  Cellulitis is caused by bacteria that enter the skin through cracks or cuts in the skin. The most common types of bacteria that cause cellulitis are staphylococci and streptococci. SIGNS AND SYMPTOMS   Redness and warmth.  Swelling.  Tenderness or pain.  Fever. DIAGNOSIS  Your health care provider can usually determine what is wrong based on a physical exam. Blood tests may also be done. TREATMENT  Treatment usually involves taking an antibiotic medicine. HOME CARE INSTRUCTIONS   Take your antibiotic medicine as directed by your health care provider. Finish the antibiotic even if you start to feel better.  Keep the infected arm or leg elevated to reduce swelling.  Apply a warm cloth to the affected area up to 4 times per day to relieve pain.  Take medicines only as directed by your health care provider.  Keep all follow-up visits as directed by your health care provider. SEEK MEDICAL CARE IF:   You notice red streaks coming from the infected area.  Your red area gets larger or turns dark in color.  Your bone or joint underneath the infected area becomes painful after the skin has healed.  Your infection returns in the same area or another area.  You notice a swollen bump in the infected area.  You develop new symptoms.  You have a fever. SEEK IMMEDIATE MEDICAL CARE IF:   You feel very sleepy.  You develop vomiting or diarrhea.  You have a general ill feeling (malaise) with muscle aches and pains.   This information is not intended to replace advice given to you by your health care provider. Make sure you discuss any questions you have with your health care provider.     Document Released: 08/14/2005 Document Revised: 07/26/2015 Document Reviewed: 01/20/2012 Elsevier Interactive Patient Education [employer]2016 Elsevier Inc.

## 2015-11-16 ENCOUNTER — Encounter (HOSPITAL_COMMUNITY): Payer: Self-pay

## 2015-11-16 ENCOUNTER — Emergency Department (HOSPITAL_COMMUNITY)
Admission: EM | Admit: 2015-11-16 | Discharge: 2015-11-16 | Disposition: A | Discharge status: Home / Self Care | Payer: Self-pay | Attending: Emergency Medicine | Admitting: Emergency Medicine

## 2015-11-16 DIAGNOSIS — L02414 Cutaneous abscess of left upper limb: Secondary | ICD-10-CM | POA: Insufficient documentation

## 2015-11-16 DIAGNOSIS — L02419 Cutaneous abscess of limb, unspecified: Secondary | ICD-10-CM

## 2015-11-16 DIAGNOSIS — Z791 Long term (current) use of non-steroidal anti-inflammatories (NSAID): Secondary | ICD-10-CM | POA: Insufficient documentation

## 2015-11-16 DIAGNOSIS — Z79899 Other long term (current) drug therapy: Secondary | ICD-10-CM | POA: Insufficient documentation

## 2015-11-16 DIAGNOSIS — Z792 Long term (current) use of antibiotics: Secondary | ICD-10-CM | POA: Insufficient documentation

## 2015-11-16 DIAGNOSIS — M1990 Unspecified osteoarthritis, unspecified site: Secondary | ICD-10-CM | POA: Insufficient documentation

## 2015-11-16 DIAGNOSIS — Z87891 Personal history of nicotine dependence: Secondary | ICD-10-CM | POA: Insufficient documentation

## 2015-11-16 NOTE — ED Notes (Signed)
Pt in for recheck of left elbow. Has been seen twice in ED for elbow. Pt reports increase swelling in elbow. Left elbow red and warm to touch with white lesion on elbow. Pt reports he continues to take antibiotics as ordered

## 2015-11-16 NOTE — ED Provider Notes (Signed)
CSN: [id number]     Arrival date & time 11/16/15  1605 History   First MD Initiated Contact with Patient 11/16/15 1833     Chief Complaint  Patient presents with  . Elbow Pain     (Consider location/radiation/quality/duration/timing/severity/associated sxs/prior Treatment) HPI Comments: Patient is a 59 year old male who presents to the emergency department for recheck of his left elbow. Patient states that on or about December 23 he was at work, working with [employer], when he thinks that something bit him. He noticed a small pimple area, but later this developed into a painful area with increased redness continued to spread. The patient was seen in the emergency department was started on antibiotic therapies. He was rechecked and placed on a second antibiotic therapy. He was advised to come to the emergency department today for another recheck of the area. He denies fever or chills he states that the entire forearm remains sensitive. He has had some stiffness of his hand, but has good range of motion of his fingers. He noticed that there is an area of pus collection in the center of the forearm, but no drainage present.    The history is provided by the patient.    Past Medical History  Diagnosis Date  . Arthritis    Past Surgical History  Procedure Laterality Date  . Colonoscopy    . Vasectomy    . Knee arthroscopy Right 01/31/2015    Procedure: RIGHT KNEE ARTHROSCOPY, PARTIAL MEDIAL MENISECTOMY;  Surgeon: Darreld Mclean, MD;  Location: [facility];  Service: Orthopedics;  Laterality: Right;   No family history on file. Social History  Substance Use Topics  . Smoking status: Former Smoker -- 1.00 packs/day for 30 years    Types: Cigarettes    Quit date: 01/29/2006  . Smokeless tobacco: None  . Alcohol Use: Yes     Comment: occ    Review of Systems  Musculoskeletal: Positive for arthralgias.  Skin: Positive for wound.  All other systems reviewed and are  negative.     Allergies  Poison ivy extract  Home Medications   Prior to Admission medications   Medication Sig Start Date End Date Taking? Authorizing Provider  cephALEXin (KEFLEX) 500 MG capsule Take 1 capsule (500 mg total) by mouth 4 (four) times daily. 11/15/15   Mancel Bale, MD  HYDROcodone-acetaminophen (NORCO) 7.5-325 MG tablet Take 1 tablet by mouth every 6 (six) hours as needed for moderate pain.    Historical Provider, MD  Multiple Vitamin (MULTIVITAMIN WITH MINERALS) TABS tablet Take 1 tablet by mouth daily.    Historical Provider, MD  naproxen (NAPROSYN) 500 MG tablet Take 500 mg by mouth 2 (two) times daily with a meal.    Historical Provider, MD  neomycin-bacitracin-polymyxin (NEOSPORIN) ointment Apply 1 application topically as needed for wound care. apply to eye    Historical Provider, MD  sulfamethoxazole-trimethoprim (BACTRIM DS,SEPTRA DS) 800-160 MG tablet Take 1 tablet by mouth 2 (two) times daily. For 10 days 11/13/15 11/20/15  Tammy Triplett, PA-C   BP 144/88 mmHg  Pulse 83  Temp(Src) 98.2 F (36.8 C) (Oral)  Resp 20  Ht  (1.803 m)  Wt 102.059 kg  BMI 31.39 kg/m2  SpO2 100% Physical Exam  Constitutional: He is oriented to person, place, and time. He appears well-developed and well-nourished.  Non-toxic appearance.  HENT:  Head: Normocephalic.  Right Ear: Tympanic membrane and external ear normal.  Left Ear: Tympanic membrane and external ear normal.  Eyes: EOM and  lids are normal. Pupils are equal, round, and reactive to light.  Neck: Normal range of motion. Neck supple. Carotid bruit is not present.  Cardiovascular: Normal rate, regular rhythm, normal heart sounds, intact distal pulses and normal pulses.   Pulmonary/Chest: Breath sounds normal. No respiratory distress.  Abdominal: Soft. Bowel sounds are normal. There is no tenderness. There is no guarding.  Musculoskeletal: Normal range of motion.  There is full range of motion of the left  shoulder, wrists, and fingers. There is erythema involving the left arm on, both the dorsal and volar aspect. There is a raised abscess at the at the olecranon. There is no red streaking appreciated. There no palpable nodes in the bicep tricep area.  Lymphadenopathy:       Head (right side): No submandibular adenopathy present.       Head (left side): No submandibular adenopathy present.    He has no cervical adenopathy.  Neurological: He is alert and oriented to person, place, and time. He has normal strength. No cranial nerve deficit or sensory deficit.  Skin: Skin is warm and dry.  Psychiatric: He has a normal mood and affect. His speech is normal.  Nursing note and vitals reviewed.   ED Course  .Marland Kitchen.Incision and Drainage Date/Time: 11/16/2015 7:45 PM Performed by: Ivery QualeBRYANT, Horacio Werth Authorized by: Ivery QualeBRYANT, Pam Vanalstine Consent: Verbal consent obtained. Risks and benefits: risks, benefits and alternatives were discussed Consent given by: patient Patient understanding: patient states understanding of the procedure being performed Patient identity confirmed: arm band Time out: Immediately prior to procedure a "time out" was called to verify the correct patient, procedure, equipment, support staff and site/side marked as required. Type: abscess Body area: upper extremity Location details: left elbow Anesthesia: local infiltration Local anesthetic: lidocaine 2% without epinephrine Anesthetic total: 8 ml Patient sedated: no Scalpel size: 11 Incision type: single straight Complexity: simple Drainage: purulent Drainage amount: copious Wound treatment: wound left open Patient tolerance: Patient tolerated the procedure well with no immediate complications   (including critical care time) Labs Review Labs Reviewed  CBC WITH DIFFERENTIAL/PLATELET    Imaging Review No results found. I have personally reviewed and evaluated these images and lab results as part of my medical decision-making.    EKG Interpretation None      MDM  Vital signs reviewed. Previous emergency department visits reviewed. The left arm shows sign of an abscess. Incision and drainage was carried out. Culture was sent to the lab. The patient states that it feels much better after incision and drainage. The patient is invited to finish his course of antibiotics. He is to do warm tub soaks daily until this heals. The patient is to return on January 3 for recheck of the wound.    Final diagnoses:  Abscess, elbow    *I have reviewed nursing notes, vital signs, and all appropriate lab and imaging results for this patient.[address]    Teala Daffron, PA-C 11/18/15 1843  Bethann BerkshireJoseph Zammit, MD 11/20/15 1524

## 2015-11-16 NOTE — ED Notes (Signed)
Covered wound in sterile gauze and wrapped with sterile gauze. Patient tolerated well.

## 2015-11-16 NOTE — Discharge Instructions (Signed)
Please continue to soak your arm in warm salt water. Please change the dressing daily. Please finish your antibiotics. Please have your wound rechecked on Tuesday, January 2. Incision and Drainage Incision and drainage is a procedure in which a sac-like structure (cystic structure) is opened and drained. The area to be drained usually contains material such as pus, fluid, or blood.  LET YOUR CAREGIVER KNOW ABOUT:   Allergies to medicine.  Medicines taken, including vitamins, herbs, eyedrops, over-the-counter medicines, and creams.  Use of steroids (by mouth or creams).  Previous problems with anesthetics or numbing medicines.  History of bleeding problems or blood clots.  Previous surgery.  Other health problems, including diabetes and kidney problems.  Possibility of pregnancy, if this applies. RISKS AND COMPLICATIONS  Pain.  Bleeding.  Scarring.  Infection. BEFORE THE PROCEDURE  You may need to have an ultrasound or other imaging tests to see how large or deep your cystic structure is. Blood tests may also be used to determine if you have an infection or how severe the infection is. You may need to have a tetanus shot. PROCEDURE  The affected area is cleaned with a cleaning fluid. The cyst area will then be numbed with a medicine (local anesthetic). A small incision will be made in the cystic structure. A syringe or catheter may be used to drain the contents of the cystic structure, or the contents may be squeezed out. The area will then be flushed with a cleansing solution. After cleansing the area, it is often gently packed with a gauze or another wound dressing. Once it is packed, it will be covered with gauze and tape or some other type of wound dressing. AFTER THE PROCEDURE   Often, you will be allowed to go home right after the procedure.  You may be given antibiotic medicine to prevent or heal an infection.  If the area was packed with gauze or some other wound  dressing, you will likely need to come back in 1 to 2 days to get it removed.  The area should heal in about 14 days.   This information is not intended to replace advice given to you by your health care provider. Make sure you discuss any questions you have with your health care provider.   Document Released: 04/30/2001 Document Revised: 05/05/2012 Document Reviewed: 12/30/2011 Elsevier Interactive Patient Education [employer]2016 Elsevier Inc.

## 2015-11-19 LAB — CULTURE, ROUTINE-ABSCESS: Special Requests: NORMAL

## 2015-11-20 ENCOUNTER — Telehealth: Payer: Self-pay | Admitting: *Deleted

## 2015-11-20 ENCOUNTER — Encounter (HOSPITAL_COMMUNITY): Payer: Self-pay | Admitting: *Deleted

## 2015-11-20 ENCOUNTER — Emergency Department (HOSPITAL_COMMUNITY)
Admission: EM | Admit: 2015-11-20 | Discharge: 2015-11-20 | Disposition: A | Discharge status: Home / Self Care | Payer: Self-pay | Attending: Emergency Medicine | Admitting: Emergency Medicine

## 2015-11-20 DIAGNOSIS — Z87891 Personal history of nicotine dependence: Secondary | ICD-10-CM | POA: Insufficient documentation

## 2015-11-20 DIAGNOSIS — Z5189 Encounter for other specified aftercare: Secondary | ICD-10-CM

## 2015-11-20 DIAGNOSIS — Z79899 Other long term (current) drug therapy: Secondary | ICD-10-CM | POA: Insufficient documentation

## 2015-11-20 DIAGNOSIS — Z791 Long term (current) use of non-steroidal anti-inflammatories (NSAID): Secondary | ICD-10-CM | POA: Insufficient documentation

## 2015-11-20 DIAGNOSIS — Z792 Long term (current) use of antibiotics: Secondary | ICD-10-CM | POA: Insufficient documentation

## 2015-11-20 DIAGNOSIS — Z4801 Encounter for change or removal of surgical wound dressing: Secondary | ICD-10-CM | POA: Insufficient documentation

## 2015-11-20 DIAGNOSIS — L02414 Cutaneous abscess of left upper limb: Secondary | ICD-10-CM | POA: Insufficient documentation

## 2015-11-20 DIAGNOSIS — M1990 Unspecified osteoarthritis, unspecified site: Secondary | ICD-10-CM | POA: Insufficient documentation

## 2015-11-20 NOTE — Discharge Instructions (Signed)
Wound Care °Taking care of your wound properly can help to prevent pain and infection. It can also help your wound to heal more quickly.  °HOW TO CARE FOR YOUR WOUND  °· Take or apply over-the-counter and prescription medicines only as told by your health care provider. °· If you were prescribed antibiotic medicine, take or apply it as told by your health care provider. Do not stop using the antibiotic even if your condition improves. °· Clean the wound each day or as told by your health care provider. °¨ Wash the wound with mild soap and water. °¨ Rinse the wound with water to remove all soap. °¨ Pat the wound dry with a clean towel. Do not rub it. °· There are many different ways to close and cover a wound. For example, a wound can be covered with stitches (sutures), skin glue, or adhesive strips. Follow instructions from your health care provider about: °¨ How to take care of your wound. °¨ When and how you should change your bandage (dressing). °¨ When you should remove your dressing. °¨ Removing whatever was used to close your wound. °· Check your wound every day for signs of infection. Watch for: °¨ Redness, swelling, or pain. °¨ Fluid, blood, or pus. °· Keep the dressing dry until your health care provider says it can be removed. Do not take baths, swim, use a hot tub, or do anything that would put your wound underwater until your health care provider approves. °· Raise (elevate) the injured area above the level of your heart while you are sitting or lying down. °· Do not scratch or pick at the wound. °· Keep all follow-up visits as told by your health care provider. This is important. °SEEK MEDICAL CARE IF: °· You received a tetanus shot and you have swelling, severe pain, redness, or bleeding at the injection site. °· You have a fever. °· Your pain is not controlled with medicine. °· You have increased redness, swelling, or pain at the site of your wound. °· You have fluid, blood, or pus coming from your  wound. °· You notice a bad smell coming from your wound or your dressing. °SEEK IMMEDIATE MEDICAL CARE IF: °· You have a red streak going away from your wound. °  °This information is not intended to replace advice given to you by your health care provider. Make sure you discuss any questions you have with your health care provider. °  °Document Released: 08/13/2008 Document Revised: 03/21/2015 Document Reviewed: 10/31/2014 °Elsevier Interactive Patient Education ©2016 Elsevier Inc. ° °

## 2015-11-20 NOTE — ED Provider Notes (Signed)
CSN: [id number]     Arrival date & time 11/20/15  1043 History  By signing my name below, I, Evon Slack, attest that this documentation has been prepared under the direction and in the presence of [employer], PA-C. Electronically Signed: Evon Slack, ED Scribe. 11/21/2015. 6:27 AM.      Chief Complaint  Patient presents with  . Wound Check    The history is provided by the patient. No language interpreter was used.   HPI Comments: Melvin Turner is a 60 y.o. male who presents to the Emergency Department for a recheck of improving abscess on his left elbow that he had I & D'd 4 days prior. Pt states that abscess is still draining but improved. He reports that the swelling and redness around the elbow is improved. Pt states that he has been compliant with taking the antibiotics prescribed. He states that he has been doing warm soaks 2x per day. Pt denies fever, nausea, vomiting or new complaints or concerns.     Past Medical History  Diagnosis Date  . Arthritis    Past Surgical History  Procedure Laterality Date  . Colonoscopy    . Vasectomy    . Knee arthroscopy Right 01/31/2015    Procedure: RIGHT KNEE ARTHROSCOPY, PARTIAL MEDIAL MENISECTOMY;  Surgeon: Darreld Mclean, MD;  Location: [facility];  Service: Orthopedics;  Laterality: Right;   History reviewed. No pertinent family history. Social History  Substance Use Topics  . Smoking status: Former Smoker -- 1.00 packs/day for 30 years    Types: Cigarettes    Quit date: 01/29/2006  . Smokeless tobacco: None  . Alcohol Use: Yes     Comment: occ    Review of Systems  Constitutional: Negative for fever.  Gastrointestinal: Negative for nausea and vomiting.  Skin: Positive for wound.  Neurological: Negative for numbness.     Allergies  Poison ivy extract  Home Medications   Prior to Admission medications   Medication Sig Start Date End Date Taking? Authorizing Provider  cephALEXin (KEFLEX) 500 MG capsule Take 1 capsule  (500 mg total) by mouth 4 (four) times daily. 11/15/15  Yes Mancel Bale, MD  HYDROcodone-acetaminophen (NORCO) 7.5-325 MG tablet Take 1 tablet by mouth every 6 (six) hours as needed for moderate pain.   Yes Historical Provider, MD  Multiple Vitamin (MULTIVITAMIN WITH MINERALS) TABS tablet Take 1 tablet by mouth daily.   Yes Historical Provider, MD  naproxen (NAPROSYN) 500 MG tablet Take 500 mg by mouth 2 (two) times daily with a meal.   Yes Historical Provider, MD  neomycin-bacitracin-polymyxin (NEOSPORIN) ointment Apply 1 application topically as needed for wound care. apply to eye   Yes Historical Provider, MD   BP 145/80 mmHg  Pulse 71  Temp(Src) 98.8 F (37.1 C) (Oral)  Resp 16  Ht 5\' 11"  (1.803 m)  Wt 102.059 kg  BMI 31.39 kg/m2  SpO2 100%   Physical Exam  Constitutional: He is oriented to person, place, and time. He appears well-developed and well-nourished. No distress.  HENT:  Head: Normocephalic and atraumatic.  Eyes: Conjunctivae and EOM are normal.  Neck: Neck supple. No tracheal deviation present.  Cardiovascular: Normal rate.   Pulmonary/Chest: Effort normal. No respiratory distress.  Musculoskeletal: Normal range of motion.  Neurological: He is alert and oriented to person, place, and time.  Skin: Skin is warm and dry.  Small draining I&D site at his left elbow, drainage is mostly serous fluid, mild pink skin surrounding the abscess but greatly  reduced from skin marker borders, skin is warm but no hot with no red streaking.   Psychiatric: He has a normal mood and affect. His behavior is normal.  Nursing note and vitals reviewed.   ED Course  Procedures (including critical care time) DIAGNOSTIC STUDIES: Oxygen Saturation is 100% on RA, normal by my interpretation.    COORDINATION OF CARE: 11:54 AM-Discussed treatment plan with pt at bedside and pt agreed to plan. Advised to complete abx, continue warm soaks, prn f/u here for any worsened redness, pain or swelling  or if the site does not continue to improve as it has done. Pt understands and agrees with plan.    Labs Review Labs Reviewed - No data to display  Imaging Review No results found.    EKG Interpretation None      MDM   Final diagnoses:  Wound check, abscess     I personally performed the services described in this documentation, which was scribed in my presence. The recorded information has been reviewed and is accurate.     Burgess AmorJulie Tashyra Adduci, PA-C 11/21/15 [id number]  Linwood DibblesJon Knapp, MD 11/23/15 [phone]

## 2015-11-20 NOTE — ED Notes (Signed)
(+)  wound culture, being treated with Keflex and Bactrim, no further treatement needed per pharmacy.

## 2015-11-20 NOTE — ED Notes (Signed)
Pt here for wound check of left elbow. Drained 11/16/15.

## 2016-01-03 ENCOUNTER — Telehealth: Payer: Self-pay | Admitting: *Deleted

## 2016-01-03 ENCOUNTER — Encounter: Payer: Self-pay | Admitting: *Deleted

## 2016-01-03 NOTE — Telephone Encounter (Signed)
Patient called requesting norco 7.5 qty 120 to be refilled. Please advise [phone]

## 2016-01-04 MED ORDER — HYDROCODONE-ACETAMINOPHEN 7.5-325 MG PO TABS: 1.0000 | ORAL_TABLET | ORAL | Status: DC | PRN

## 2016-01-04 NOTE — Telephone Encounter (Signed)
Patient aware prescription is available for pick up

## 2016-01-04 NOTE — Telephone Encounter (Signed)
Rx printed

## 2016-01-23 ENCOUNTER — Ambulatory Visit (INDEPENDENT_AMBULATORY_CARE_PROVIDER_SITE_OTHER): Payer: BLUE CROSS/BLUE SHIELD | Admitting: Orthopaedic Surgery

## 2016-01-23 VITALS — BP 177/87 | HR 69 | Temp 97.9°F | Ht 70.0 in | Wt 214.8 lb

## 2016-01-23 DIAGNOSIS — M25561 Pain in right knee: Secondary | ICD-10-CM

## 2016-01-23 NOTE — Progress Notes (Addendum)
Melvin Turner, male DOB:1956-07-02, 60 y.o. EAV:[id number]  Chief Complaint  Melvin presents with  . Follow-up    Bilateral knee pain    HPI  Melvin Turner is a 60 y.o. male who has chronic pain of the right knee.  He is a year post arthroscopy of the right knee.  He has recurring effusions.  He has no giving way and no new trauma.  He uses a knee brace which helps.  He is taking his medicine.  He has been having more pain recently and considers it worse over the last few weeks.  Knee Pain  The incident occurred more than 1 week ago. The pain is present in the right knee. The quality of the pain is described as aching. The pain is at a severity of 4/10. The pain is moderate. The pain has been worsening since onset. Associated symptoms include a loss of motion. The symptoms are aggravated by weight bearing. He has tried ice, elevation, heat, NSAIDs and rest for the symptoms. The treatment provided mild relief.    Body mass index is 30.82 kg/(m^2).   Review of Systems  Constitutional:       Melvin does not have Diabetes Mellitus. Melvin does not have hypertension. Melvin does not have COPD or shortness of breath. Melvin does not have BMI > 35. Melvin does not have current smoking history.  HENT: Negative for congestion.   Respiratory: Negative for cough and shortness of breath.   Cardiovascular: Negative for chest pain.  Endocrine: Positive for cold intolerance.  Musculoskeletal: Positive for myalgias, joint swelling, arthralgias and gait problem.  Allergic/Immunologic: Negative for environmental allergies.    Past Medical History  Diagnosis Date  . Arthritis     Past Surgical History  Procedure Laterality Date  . Colonoscopy    . Vasectomy    . Knee arthroscopy Right 01/31/2015    Procedure: RIGHT KNEE ARTHROSCOPY, PARTIAL MEDIAL MENISECTOMY;  Surgeon: Darreld Mclean, MD;  Location: [facility];  Service: Orthopedics;  Laterality: Right;    No family history on  file.  Social History Social History  Substance Use Topics  . Smoking status: Former Smoker -- 1.00 packs/day for 30 years    Types: Cigarettes    Quit date: 01/29/2006  . Smokeless tobacco: Not on file  . Alcohol Use: Yes     Comment: occ    Allergies  Allergen Reactions  . Poison Ivy Extract [Extract Of Poison Ivy] Hives and Rash    Current Outpatient Prescriptions  Medication Sig Dispense Refill  . HYDROcodone-acetaminophen (NORCO) 7.5-325 MG tablet Take 1 tablet by mouth every 4 (four) hours as needed for moderate pain (Must last 30 days.  Do not drive or operate machinery while taking this medicine.). 120 tablet 0  . Multiple Vitamin (MULTIVITAMIN WITH MINERALS) TABS tablet Take 1 tablet by mouth daily.    . naproxen (NAPROSYN) 500 MG tablet Take 500 mg by mouth 2 (two) times daily with a meal.    . neomycin-bacitracin-polymyxin (NEOSPORIN) ointment Apply 1 application topically as needed for wound care. Reported on 01/23/2016     No current facility-administered medications for this visit.     Physical Exam  Blood pressure 177/87, pulse 69, temperature 97.9 F (36.6 C), height  (1.778 m), weight 214 lb 12.8 oz (97.433 kg).  Constitutional: overall normal hygiene, normal nutrition, well developed, normal grooming, normal body habitus. Assistive device:bracesright knee  Musculoskeletal: gait and station Limp right, muscle tone and strength are normal,  no tremors or atrophy is present.  .  Neurological: coordination overall normal.  Deep tendon reflex/nerve stretch intact.  Sensation normal.  Cranial nerves II-XII intact.   Skin:   normal overall no scars, lesions, ulcers or rashes. No psoriasis.  Psychiatric: Alert and oriented x 3.  Recent memory intact, remote memory unclear.  Normal mood and affect. Well groomed.  Good eye contact.  Cardiovascular: overall no swelling, no varicosities, no edema bilaterally, normal temperatures of the legs and arms, no clubbing,  cyanosis and good capillary refill.  Lymphatic: palpation is normal. The right lower extremity is examined:  Inspection:  Thigh:  Non-tender and no defects  Knee has swelling 1+ effusion.                        Joint tenderness is present                        Melvin is tender over the medial joint line  Lower Leg:  Has normal appearance and no tenderness or defects  Ankle:  Non-tender and no defects  Foot:  Non-tender and no defects Range of Motion:  Knee:  Range of motion is: 0 to 110                        Crepitus is  present  Ankle:  Range of motion is normal. Strength and Tone:  The right lower extremity has normal strength and tone. Stability:  Knee:  The knee is stable.  Ankle:  The ankle is stable.  The Melvin request injection, verbal consent was obtained.  The right knee was prepped appropriately after time out was performed.   Sterile technique was observed and injection of 1 cc of Depo-Medrol 40 mg with several cc's of plain xylocaine. Anesthesia was provided by ethyl chloride and a 20-gauge needle was used to inject the knee area. The injection was tolerated well.  A band aid dressing was applied.  The Melvin was advised to apply ice later today and tomorrow to the injection sight as needed.  I have talked to him about his pain.  He may be a candidate for Synvisc.  I will inject prednisone today.   The Melvin has been educated about the nature of the problem(s) and counseled on treatment options.  The Melvin appeared to understand what I have discussed and is in agreement with it.  Encounter Diagnosis  Name Primary?  . Right knee pain Yes    PLAN Call if any problems.  Precautions discussed.  Continue current medications.   Return to clinic one month

## 2016-01-30 ENCOUNTER — Ambulatory Visit: Payer: Self-pay | Admitting: Orthopaedic Surgery

## 2016-02-05 ENCOUNTER — Telehealth: Payer: Self-pay | Admitting: Orthopaedic Surgery

## 2016-02-05 ENCOUNTER — Other Ambulatory Visit: Payer: Self-pay | Admitting: *Deleted

## 2016-02-05 MED ORDER — HYDROCODONE-ACETAMINOPHEN 7.5-325 MG PO TABS: 1.0000 | ORAL_TABLET | ORAL | Status: DC | PRN

## 2016-02-05 NOTE — Telephone Encounter (Signed)
PRESCRIPTION AVAILABLE 

## 2016-02-05 NOTE — Telephone Encounter (Signed)
Patient called to request refill on medication HYDROcodone-acetaminophen (NORCO) 7.5-325 MG tablet [[id number]][[id number]] - quantity 120; patient aware Dr Hilda LiasKeeling is out of office; states his refill was due Friday, 02/02/16; therefore aware that Dr Romeo AppleHarrison to review and advise.  Patient [phone]#[phone]

## 2016-02-20 ENCOUNTER — Ambulatory Visit: Payer: BLUE CROSS/BLUE SHIELD | Admitting: Orthopaedic Surgery

## 2016-02-22 ENCOUNTER — Ambulatory Visit (INDEPENDENT_AMBULATORY_CARE_PROVIDER_SITE_OTHER): Payer: BLUE CROSS/BLUE SHIELD | Admitting: Orthopaedic Surgery

## 2016-02-22 ENCOUNTER — Encounter: Payer: Self-pay | Admitting: Orthopaedic Surgery

## 2016-02-22 VITALS — BP 205/107 | HR 67 | Temp 97.7°F | Ht 70.0 in | Wt 211.0 lb

## 2016-02-22 DIAGNOSIS — M25561 Pain in right knee: Secondary | ICD-10-CM

## 2016-02-22 NOTE — Progress Notes (Signed)
Patient ZO:XWRU:Melvin Turner, male DOB:1956-06-22, 60 y.o. EAV:[id number]  Chief Complaint  Patient presents with  . Pain    HPI  Melvin Turner is a 60 y.o. male has chronic pain of the right knee.  He has popping and swelling but no giving way, no locking, no redness.  He uses a knee sleeve all the time.  Rainy weather makes him worse.  He is taking his medicine which helps.  He has no new trauma.   HPI  Body mass index is 30.28 kg/(m^2).  Review of Systems  Constitutional:       Patient does not have Diabetes Mellitus. Patient does not have hypertension. Patient does not have COPD or shortness of breath. Patient does not have BMI > 35. Patient does not have current smoking history.  HENT: Negative for congestion.   Respiratory: Negative for cough and shortness of breath.   Cardiovascular: Negative for chest pain.  Endocrine: Positive for cold intolerance.  Musculoskeletal: Positive for myalgias, joint swelling, arthralgias and gait problem.  Allergic/Immunologic: Negative for environmental allergies.    Past Medical History  Diagnosis Date  . Arthritis     Past Surgical History  Procedure Laterality Date  . Colonoscopy    . Vasectomy    . Knee arthroscopy Right 01/31/2015    Procedure: RIGHT KNEE ARTHROSCOPY, PARTIAL MEDIAL MENISECTOMY;  Surgeon: Darreld McleanWayne Journiee Feldkamp, MD;  Location: [facility];  Service: Orthopedics;  Laterality: Right;    No family history on file.  Social History Social History  Substance Use Topics  . Smoking status: Former Smoker -- 1.00 packs/day for 30 years    Types: Cigarettes    Quit date: 01/29/2006  . Smokeless tobacco: None  . Alcohol Use: Yes     Comment: occ    Allergies  Allergen Reactions  . Poison Ivy Extract [Extract Of Poison Ivy] Hives and Rash    Current Outpatient Prescriptions  Medication Sig Dispense Refill  . HYDROcodone-acetaminophen (NORCO) 7.5-325 MG tablet Take 1 tablet by mouth every 4 (four) hours as needed for moderate pain  (Must last 30 days.  Do not drive or operate machinery while taking this medicine.). 120 tablet 0  . Multiple Vitamin (MULTIVITAMIN WITH MINERALS) TABS tablet Take 1 tablet by mouth daily.    . naproxen (NAPROSYN) 500 MG tablet Take 500 mg by mouth 2 (two) times daily with a meal.    . neomycin-bacitracin-polymyxin (NEOSPORIN) ointment Apply 1 application topically as needed for wound care. Reported on 01/23/2016     No current facility-administered medications for this visit.     Physical Exam  Blood pressure 205/107, pulse 67, temperature 97.7 F (36.5 C), temperature source Tympanic, height 5\' 10"  (1.778 m), weight 211 lb (95.709 kg).  Constitutional: overall normal hygiene, normal nutrition, well developed, normal grooming, normal body habitus. Assistive device:none  Musculoskeletal: gait and station Limp right, muscle tone and strength are normal, no tremors or atrophy is present.  .  Neurological: coordination overall normal.  Deep tendon reflex/nerve stretch intact.  Sensation normal.  Cranial nerves II-XII intact.   Skin:   normal overall no scars, lesions, ulcers or rashes. No psoriasis.  Psychiatric: Alert and oriented x 3.  Recent memory intact, remote memory unclear.  Normal mood and affect. Well groomed.  Good eye contact.  Cardiovascular: overall no swelling, no varicosities, no edema bilaterally, normal temperatures of the legs and arms, no clubbing, cyanosis and good capillary refill.  Lymphatic: palpation is normal.  The right lower extremity is  examined:  Inspection:  Thigh:  Non-tender and no defects  Knee has swelling 1+ effusion.                        Joint tenderness is present                        Patient is tender over the medial joint line  Lower Leg:  Has normal appearance and no tenderness or defects  Ankle:  Non-tender and no defects  Foot:  Non-tender and no defects Range of Motion:  Knee:  Range of motion is: 0-105                         Crepitus is  present  Ankle:  Range of motion is normal. Strength and Tone:  The right lower extremity has normal strength and tone. Stability:  Knee:  The knee is stable.  Ankle:  The ankle is stable.  The patient has been educated about the nature of the problem(s) and counseled on treatment options.  The patient appeared to understand what I have discussed and is in agreement with it.  Encounter Diagnosis  Name Primary?  . Right knee pain Yes    PLAN Call if any problems.  Precautions discussed.  Continue current medications.   Return to clinic 2 months

## 2016-03-06 ENCOUNTER — Telehealth: Payer: Self-pay | Admitting: Orthopaedic Surgery

## 2016-03-06 MED ORDER — HYDROCODONE-ACETAMINOPHEN 7.5-325 MG PO TABS: 1.0000 | ORAL_TABLET | ORAL | Status: DC | PRN

## 2016-03-06 NOTE — Telephone Encounter (Signed)
Rx done. 

## 2016-03-06 NOTE — Telephone Encounter (Signed)
Hydrocodone-Acetaminophen 7.5/325mg Qty 120 Tablets °

## 2016-04-09 ENCOUNTER — Telehealth: Payer: Self-pay | Admitting: Orthopaedic Surgery

## 2016-04-09 MED ORDER — HYDROCODONE-ACETAMINOPHEN 7.5-325 MG PO TABS: 1.0000 | ORAL_TABLET | ORAL | Status: DC | PRN

## 2016-04-09 NOTE — Telephone Encounter (Signed)
Rx done. 

## 2016-04-09 NOTE — Telephone Encounter (Signed)
Patient called and requested a refill on Hydrocodone-Acetaminophen (Norco)  7.5-325 mgs. Qty 120 Sig: Take 1 tablet by mouth every 4 (four) hours as needed for moderate pain (Must last 30 days.  Do not drive or operate machinery while taking this medicine.). °

## 2016-04-23 ENCOUNTER — Ambulatory Visit (INDEPENDENT_AMBULATORY_CARE_PROVIDER_SITE_OTHER): Payer: BLUE CROSS/BLUE SHIELD | Admitting: Orthopaedic Surgery

## 2016-04-23 ENCOUNTER — Ambulatory Visit (INDEPENDENT_AMBULATORY_CARE_PROVIDER_SITE_OTHER): Payer: BLUE CROSS/BLUE SHIELD

## 2016-04-23 ENCOUNTER — Ambulatory Visit: Payer: BLUE CROSS/BLUE SHIELD

## 2016-04-23 ENCOUNTER — Encounter: Payer: Self-pay | Admitting: Orthopaedic Surgery

## 2016-04-23 VITALS — BP 130/81 | HR 74 | Ht 67.75 in | Wt 205.8 lb

## 2016-04-23 DIAGNOSIS — M25522 Pain in left elbow: Secondary | ICD-10-CM | POA: Diagnosis not present

## 2016-04-23 DIAGNOSIS — M25561 Pain in right knee: Secondary | ICD-10-CM | POA: Diagnosis not present

## 2016-04-23 NOTE — Progress Notes (Signed)
Patient Melvin Turner, male DOB:01/11/1956, 60 y.o. UXL:[id number]  Chief Complaint  Patient presents with  . Follow-up    Right shoulder and right knee and new problem left elbow    HPI  Melvin Turner is a 60 y.o. male who has had right shoulder and knee pain but is here today for a new problem:  Left elbow pain and swelling.  He has had swelling of the left olecranon bursa for about two weeks.  It is not going down. It is tender.  He has no trauma, no wound, no drainage, no redness.  He has tried ice and rest with no help.  HPI  Body mass index is 31.52 kg/(m^2).  ROS  Review of Systems  Constitutional:       Patient does not have Diabetes Mellitus. Patient does not have hypertension. Patient does not have COPD or shortness of breath. Patient does not have BMI > 35. Patient does not have current smoking history.  HENT: Negative for congestion.   Respiratory: Negative for cough and shortness of breath.   Cardiovascular: Negative for chest pain.  Endocrine: Positive for cold intolerance.  Musculoskeletal: Positive for myalgias, joint swelling, arthralgias and gait problem.  Allergic/Immunologic: Negative for environmental allergies.    Past Medical History  Diagnosis Date  . Arthritis     Past Surgical History  Procedure Laterality Date  . Colonoscopy    . Vasectomy    . Knee arthroscopy Right 01/31/2015    Procedure: RIGHT KNEE ARTHROSCOPY, PARTIAL MEDIAL MENISECTOMY;  Surgeon: Darreld Mclean, MD;  Location: [facility];  Service: Orthopedics;  Laterality: Right;    History reviewed. No pertinent family history.  Social History Social History  Substance Use Topics  . Smoking status: Former Smoker -- 1.00 packs/day for 30 years    Types: Cigarettes    Quit date: 01/29/2006  . Smokeless tobacco: None  . Alcohol Use: Yes     Comment: occ    Allergies  Allergen Reactions  . Poison Ivy Extract [Extract Of Poison Ivy] Hives and Rash    Current Outpatient  Prescriptions  Medication Sig Dispense Refill  . benazepril-hydrochlorthiazide (LOTENSIN HCT) 20-12.5 MG tablet Take 1 tablet by mouth daily.    Marland Kitchen HYDROcodone-acetaminophen (NORCO) 7.5-325 MG tablet Take 1 tablet by mouth every 4 (four) hours as needed for moderate pain (Must last 30 days.  Do not drive or operate machinery while taking this medicine.). 120 tablet 0  . Multiple Vitamin (MULTIVITAMIN WITH MINERALS) TABS tablet Take 1 tablet by mouth daily.    . naproxen (NAPROSYN) 500 MG tablet Take 500 mg by mouth 2 (two) times daily with a meal.    . neomycin-bacitracin-polymyxin (NEOSPORIN) ointment Apply 1 application topically as needed for wound care. Reported on 01/23/2016    . tamsulosin (FLOMAX) 0.4 MG CAPS capsule Take 0.4 mg by mouth.     No current facility-administered medications for this visit.     Physical Exam  Blood pressure 130/81, pulse 74, height 5' 7.75" (1.721 m), weight 205 lb 12.8 oz (93.35 kg).  Constitutional: overall normal hygiene, normal nutrition, well developed, normal grooming, normal body habitus. Assistive device:neoprene sleeve brace right  Musculoskeletal: gait and station Limp right, muscle tone and strength are normal, no tremors or atrophy is present.  .  Neurological: coordination overall normal.  Deep tendon reflex/nerve stretch intact.  Sensation normal.  Cranial nerves II-XII intact.   Skin:   normal overall no scars, lesions, ulcers or rashes. No psoriasis.  Psychiatric: Alert and oriented x 3.  Recent memory intact, remote memory unclear.  Normal mood and affect. Well groomed.  Good eye contact.  Cardiovascular: overall no swelling, no varicosities, no edema bilaterally, normal temperatures of the legs and arms, no clubbing, cyanosis and good capillary refill.  Lymphatic: palpation is normal.  His left elbow has a large olecranon bursa the size of a small egg.  It is not moveable.  It is not red.  ROM is full.  NV is intact.  Right elbow  negative.  PROCEDURE NOTE  After prep and patient permission, I aspirated by sterile technique the left olecranon bursa and obtained about 14 cc of bloody colored fluid tolerated well.  I instilled 1% Xylocaine and 1 cc of DepoMedrol 40.  Dressing applied at the end.  The patient has been educated about the nature of the problem(s) and counseled on treatment options.  The patient appeared to understand what I have discussed and is in agreement with it.  Encounter Diagnoses  Name Primary?  . Left elbow pain Yes  . Right knee pain     PLAN Call if any problems.  Precautions discussed.  Continue current medications.   Return to clinic 1 week   He may need excision of the bursa.  Electronically Signed Darreld McleanWayne [place], MD 6/6/20172:41 PM

## 2016-05-01 ENCOUNTER — Ambulatory Visit: Payer: BLUE CROSS/BLUE SHIELD | Admitting: Orthopaedic Surgery

## 2016-05-02 ENCOUNTER — Encounter: Payer: Self-pay | Admitting: Orthopaedic Surgery

## 2016-05-02 ENCOUNTER — Ambulatory Visit (INDEPENDENT_AMBULATORY_CARE_PROVIDER_SITE_OTHER): Payer: BLUE CROSS/BLUE SHIELD | Admitting: Orthopaedic Surgery

## 2016-05-02 VITALS — BP 143/82 | HR 62 | Temp 97.5°F | Ht 69.0 in | Wt 208.0 lb

## 2016-05-02 DIAGNOSIS — M25561 Pain in right knee: Secondary | ICD-10-CM

## 2016-05-02 DIAGNOSIS — M25522 Pain in left elbow: Secondary | ICD-10-CM

## 2016-05-02 DIAGNOSIS — M7022 Olecranon bursitis, left elbow: Secondary | ICD-10-CM | POA: Diagnosis not present

## 2016-05-02 NOTE — Progress Notes (Signed)
Patient JY:NWGN:Melvin Turner, male DOB:12-Jun-1956, 60 y.o. FAO:[id number]  Chief Complaint  Patient presents with  . Follow-up    Left elbow    HPI  Melvin Turner is a 60 y.o. male who has olecranon bursitis on the left.  I aspirated the bursa last visit.  It has remained "deflated" and is not hurting him now.  He has no redness, no pain.  He has right knee pain as well.  He is using a knee sleeve.  He has medial pain and swelling.  He has no new trauma.  He is working full time.  HPI  Body mass index is 30.7 kg/(m^2).  ROS  Review of Systems  Constitutional:       Patient does not have Diabetes Mellitus. Patient does not have hypertension. Patient does not have COPD or shortness of breath. Patient does not have BMI > 35. Patient does not have current smoking history.  HENT: Negative for congestion.   Respiratory: Negative for cough and shortness of breath.   Cardiovascular: Negative for chest pain.  Endocrine: Positive for cold intolerance.  Musculoskeletal: Positive for myalgias, joint swelling, arthralgias and gait problem.  Allergic/Immunologic: Negative for environmental allergies.    Past Medical History  Diagnosis Date  . Arthritis     Past Surgical History  Procedure Laterality Date  . Colonoscopy    . Vasectomy    . Knee arthroscopy Right 01/31/2015    Procedure: RIGHT KNEE ARTHROSCOPY, PARTIAL MEDIAL MENISECTOMY;  Surgeon: Darreld McleanWayne Saige [place], MD;  Location: [facility];  Service: Orthopedics;  Laterality: Right;    History reviewed. No pertinent family history.  Social History Social History  Substance Use Topics  . Smoking status: Former Smoker -- 1.00 packs/day for 30 years    Types: Cigarettes    Quit date: 01/29/2006  . Smokeless tobacco: None  . Alcohol Use: Yes     Comment: occ    Allergies  Allergen Reactions  . Poison Ivy Extract [Extract Of Poison Ivy] Hives and Rash    Current Outpatient Prescriptions  Medication Sig Dispense Refill  .  benazepril-hydrochlorthiazide (LOTENSIN HCT) 20-12.5 MG tablet Take 1 tablet by mouth daily.    Marland Kitchen. HYDROcodone-acetaminophen (NORCO) 7.5-325 MG tablet Take 1 tablet by mouth every 4 (four) hours as needed for moderate pain (Must last 30 days.  Do not drive or operate machinery while taking this medicine.). 120 tablet 0  . Multiple Vitamin (MULTIVITAMIN WITH MINERALS) TABS tablet Take 1 tablet by mouth daily.    . naproxen (NAPROSYN) 500 MG tablet Take 500 mg by mouth 2 (two) times daily with a meal.    . neomycin-bacitracin-polymyxin (NEOSPORIN) ointment Apply 1 application topically as needed for wound care. Reported on 01/23/2016    . tamsulosin (FLOMAX) 0.4 MG CAPS capsule Take 0.4 mg by mouth.     No current facility-administered medications for this visit.     Physical Exam  Blood pressure 143/82, pulse 62, temperature 97.5 F (36.4 C), height 5\' 9"  (1.753 m), weight 208 lb (94.348 kg).  Constitutional: overall normal hygiene, normal nutrition, well developed, normal grooming, normal body habitus. Assistive device:hinge knee brace  Musculoskeletal: gait and station Limp right, muscle tone and strength are normal, no tremors or atrophy is present.  .  Neurological: coordination overall normal.  Deep tendon reflex/nerve stretch intact.  Sensation normal.  Cranial nerves II-XII intact.   Skin:   normal overall no scars, lesions, ulcers or rashes. No psoriasis.  Psychiatric: Alert and oriented x 3.  Recent memory intact, remote memory unclear.  Normal mood and affect. Well groomed.  Good eye contact.  Cardiovascular: overall no swelling, no varicosities, no edema bilaterally, normal temperatures of the legs and arms, no clubbing, cyanosis and good capillary refill.  Lymphatic: palpation is normal.  The right lower extremity is examined:  Inspection:  Thigh:  Non-tender and no defects  Knee has swelling 2+ effusion.                        Joint tenderness is present                         Patient is tender over the medial joint line  Lower Leg:  Has normal appearance and no tenderness or defects  Ankle:  Non-tender and no defects  Foot:  Non-tender and no defects Range of Motion:  Knee:  Range of motion is: 0-100                        Crepitus is  present  Ankle:  Range of motion is normal. Strength and Tone:  The right lower extremity has normal strength and tone. Stability:  Knee:  The knee is stable.  Ankle:  The ankle is stable.  Left knee negative.  The left elbow has a very small olecranon bursa present, much improved from last visit.  There is no redness, no pain.  He has full ROM.  Right elbow negative.  The patient has been educated about the nature of the problem(s) and counseled on treatment options.  The patient appeared to understand what I have discussed and is in agreement with it.  Encounter Diagnoses  Name Primary?  . Left elbow pain Yes  . Olecranon bursitis of left elbow   . Right knee pain     PLAN Call if any problems.  Precautions discussed.  Continue current medications.   Return to clinic 6 weeks   Electronically Signed Darreld Mclean, MD 6/15/20178:47 AM

## 2016-05-09 ENCOUNTER — Telehealth: Payer: Self-pay | Admitting: Orthopaedic Surgery

## 2016-05-09 MED ORDER — HYDROCODONE-ACETAMINOPHEN 7.5-325 MG PO TABS: 1.0000 | ORAL_TABLET | ORAL | Status: DC | PRN

## 2016-05-09 NOTE — Telephone Encounter (Signed)
Rx done. 

## 2016-05-09 NOTE — Telephone Encounter (Signed)
Patient called to request refill on: HYDROcodone-acetaminophen (NORCO) 7.5-325 MG tablet [[id number]][[id number]] - quantity 120.

## 2016-06-05 ENCOUNTER — Telehealth: Payer: Self-pay | Admitting: Orthopaedic Surgery

## 2016-06-05 MED ORDER — HYDROCODONE-ACETAMINOPHEN 7.5-325 MG PO TABS: 1.0000 | ORAL_TABLET | ORAL | Status: DC | PRN

## 2016-06-05 NOTE — Telephone Encounter (Signed)
Patient requests a refill on Hydrocodone/Acetaminophen 7.5-325 mgs.  Qty  120  ° °Sig: Take 1 tablet by mouth every 4 (four) hours as needed for moderate pain (Must last 30 days.  Do not drive or operate machinery while taking this medicine.). °

## 2016-06-05 NOTE — Telephone Encounter (Signed)
Rx Done . 

## 2016-06-13 ENCOUNTER — Ambulatory Visit: Payer: BLUE CROSS/BLUE SHIELD | Admitting: Orthopaedic Surgery

## 2016-06-18 ENCOUNTER — Encounter: Payer: Self-pay | Admitting: Orthopaedic Surgery

## 2016-06-18 ENCOUNTER — Ambulatory Visit (INDEPENDENT_AMBULATORY_CARE_PROVIDER_SITE_OTHER): Payer: BLUE CROSS/BLUE SHIELD | Admitting: Orthopaedic Surgery

## 2016-06-18 VITALS — BP 137/78 | Ht 70.0 in | Wt 212.0 lb

## 2016-06-18 DIAGNOSIS — M25522 Pain in left elbow: Secondary | ICD-10-CM

## 2016-06-18 DIAGNOSIS — M25561 Pain in right knee: Secondary | ICD-10-CM | POA: Diagnosis not present

## 2016-06-18 NOTE — Progress Notes (Signed)
Patient Melvin Turner, male DOB:22-Aug-1956, 60 y.o. LKG:[id number]  Chief Complaint  Patient presents with  . Follow-up    left elbow, right knee    HPI  Melvin Turner is a 60 y.o. male who has right knee pain and left elbow bursitis.  The left olecranon bursa has improved and is not enlarged.  He has no pain, no redness.  The right knee has pain and some popping and some swelling at times.  He has no giving way, no locking.  He works third shift and just got off.  He has more tenderness at the end of a long night. HPI  Body mass index is 30.42 kg/m.  ROS  Review of Systems  Constitutional:       Patient does not have Diabetes Mellitus. Patient does not have hypertension. Patient does not have COPD or shortness of breath. Patient does not have BMI > 35. Patient does not have current smoking history.  HENT: Negative for congestion.   Respiratory: Negative for cough and shortness of breath.   Cardiovascular: Negative for chest pain.  Endocrine: Positive for cold intolerance.  Musculoskeletal: Positive for arthralgias, gait problem, joint swelling and myalgias.  Allergic/Immunologic: Negative for environmental allergies.    Past Medical History:  Diagnosis Date  . Arthritis     Past Surgical History:  Procedure Laterality Date  . COLONOSCOPY    . KNEE ARTHROSCOPY Right 01/31/2015   Procedure: RIGHT KNEE ARTHROSCOPY, PARTIAL MEDIAL MENISECTOMY;  Surgeon: Darreld Mclean, MD;  Location: [facility];  Service: Orthopedics;  Laterality: Right;  Marland Kitchen VASECTOMY      History reviewed. No pertinent family history.  Social History Social History  Substance Use Topics  . Smoking status: Former Smoker    Packs/day: 1.00    Years: 30.00    Types: Cigarettes    Quit date: 01/29/2006  . Smokeless tobacco: Never Used  . Alcohol use Yes     Comment: occ    Allergies  Allergen Reactions  . Poison Ivy Extract [Extract Of Poison Ivy] Hives and Rash    Current Outpatient Prescriptions   Medication Sig Dispense Refill  . benazepril-hydrochlorthiazide (LOTENSIN HCT) 20-12.5 MG tablet Take 1 tablet by mouth daily.    Marland Kitchen HYDROcodone-acetaminophen (NORCO) 7.5-325 MG tablet Take 1 tablet by mouth every 4 (four) hours as needed for moderate pain (Must last 30 days.  Do not drive or operate machinery while taking this medicine.). 120 tablet 0  . Multiple Vitamin (MULTIVITAMIN WITH MINERALS) TABS tablet Take 1 tablet by mouth daily.    . naproxen (NAPROSYN) 500 MG tablet Take 500 mg by mouth 2 (two) times daily with a meal.    . neomycin-bacitracin-polymyxin (NEOSPORIN) ointment Apply 1 application topically as needed for wound care. Reported on 01/23/2016    . tamsulosin (FLOMAX) 0.4 MG CAPS capsule Take 0.4 mg by mouth.     No current facility-administered medications for this visit.      Physical Exam  Blood pressure 137/78, height  (1.778 m), weight 212 lb (96.2 kg).  Constitutional: overall normal hygiene, normal nutrition, well developed, normal grooming, normal body habitus. Assistive device:none  Musculoskeletal: gait and station Limp none, muscle tone and strength are normal, no tremors or atrophy is present.  .  Neurological: coordination overall normal.  Deep tendon reflex/nerve stretch intact.  Sensation normal.  Cranial nerves II-XII intact.   Skin:   normal overall no scars, lesions, ulcers or rashes. No psoriasis.  Psychiatric: Alert and oriented x  3.  Recent memory intact, remote memory unclear.  Normal mood and affect. Well groomed.  Good eye contact.  Cardiovascular: overall no swelling, no varicosities, no edema bilaterally, normal temperatures of the legs and arms, no clubbing, cyanosis and good capillary refill.  Lymphatic: palpation is normal.  The right lower extremity is examined:  Inspection:  Thigh:  Non-tender and no defects  Knee has swelling 1+ effusion.                        Joint tenderness is present                        Patient is  tender over the medial joint line  Lower Leg:  Has normal appearance and no tenderness or defects  Ankle:  Non-tender and no defects  Foot:  Non-tender and no defects Range of Motion:  Knee:  Range of motion is: 0-110                        Crepitus is  present  Ankle:  Range of motion is normal. Strength and Tone:  The right lower extremity has normal strength and tone. Stability:  Knee:  The knee is stable.  Ankle:  The ankle is stable.  Left knee negative.  His left elbow has full motion and no pain.  The olecranon bursa is normal.  NV intact.  The patient has been educated about the nature of the problem(s) and counseled on treatment options.  The patient appeared to understand what I have discussed and is in agreement with it.  Encounter Diagnoses  Name Primary?  . Left elbow pain Yes  . Right knee pain     PLAN Call if any problems.  Precautions discussed.  Continue current medications.   Return to clinic 6 weeks   Electronically Signed Darreld Mclean, MD 8/1/20178:26 AM

## 2016-07-10 ENCOUNTER — Telehealth: Payer: Self-pay | Admitting: Orthopaedic Surgery

## 2016-07-10 MED ORDER — HYDROCODONE-ACETAMINOPHEN 5-325 MG PO TABS: 1.0000 | ORAL_TABLET | ORAL | 0 refills | Status: DC | PRN

## 2016-07-10 NOTE — Telephone Encounter (Signed)
Patient requests refill on: HYDROcodone-acetaminophen (NORCO) 7.5-325 MG tablet - quantity 120. °

## 2016-07-25 ENCOUNTER — Encounter: Payer: Self-pay | Admitting: Orthopaedic Surgery

## 2016-07-25 ENCOUNTER — Ambulatory Visit (INDEPENDENT_AMBULATORY_CARE_PROVIDER_SITE_OTHER): Payer: BLUE CROSS/BLUE SHIELD | Admitting: Orthopaedic Surgery

## 2016-07-25 VITALS — BP 149/86 | HR 67 | Temp 97.5°F | Ht 69.5 in | Wt 208.0 lb

## 2016-07-25 DIAGNOSIS — M25522 Pain in left elbow: Secondary | ICD-10-CM

## 2016-07-25 DIAGNOSIS — M25561 Pain in right knee: Secondary | ICD-10-CM | POA: Diagnosis not present

## 2016-07-25 NOTE — Progress Notes (Signed)
Patient Melvin Turner, male DOB:1956-03-20, 60 y.o. BJY:[id number]  Chief Complaint  Patient presents with  . Follow-up    Right knee and left elbow pain    HPI  Melvin Turner is a 60 y.o. male who has chronic right knee pain and left elbow pain.  The right knee is his main area of pain.  He has a knee sleeve.  He has swelling and popping. He has no giving way.  He has no redness or numbness.  He is active.  He has no new trauma.  The left elbow is better. He has full motion. He has no olecranon bursa swelling now.  He has slight tenderness laterally.  NV intact. HPI  Body mass index is 30.28 kg/m.  ROS  Review of Systems  Constitutional:       Patient does not have Diabetes Mellitus. Patient does not have hypertension. Patient does not have COPD or shortness of breath. Patient does not have BMI > 35. Patient does not have current smoking history.  HENT: Negative for congestion.   Respiratory: Negative for cough and shortness of breath.   Cardiovascular: Negative for chest pain.  Endocrine: Positive for cold intolerance.  Musculoskeletal: Positive for arthralgias, gait problem, joint swelling and myalgias.  Allergic/Immunologic: Negative for environmental allergies.    Past Medical History:  Diagnosis Date  . Arthritis     Past Surgical History:  Procedure Laterality Date  . COLONOSCOPY    . KNEE ARTHROSCOPY Right 01/31/2015   Procedure: RIGHT KNEE ARTHROSCOPY, PARTIAL MEDIAL MENISECTOMY;  Surgeon: Darreld Mclean, MD;  Location: [facility];  Service: Orthopedics;  Laterality: Right;  Marland Kitchen VASECTOMY      History reviewed. No pertinent family history.  Social History Social History  Substance Use Topics  . Smoking status: Former Smoker    Packs/day: 1.00    Years: 30.00    Types: Cigarettes    Quit date: 01/29/2006  . Smokeless tobacco: Never Used  . Alcohol use Yes     Comment: occ    Allergies  Allergen Reactions  . Poison Ivy Extract [Extract Of Poison Ivy]  Hives and Rash    Current Outpatient Prescriptions  Medication Sig Dispense Refill  . benazepril-hydrochlorthiazide (LOTENSIN HCT) 20-12.5 MG tablet Take 1 tablet by mouth daily.    Marland Kitchen HYDROcodone-acetaminophen (NORCO/VICODIN) 5-325 MG tablet Take 1 tablet by mouth every 4 (four) hours as needed for moderate pain (Must last 30 days.  Do not take and drive a car or use machinery.). 120 tablet 0  . Multiple Vitamin (MULTIVITAMIN WITH MINERALS) TABS tablet Take 1 tablet by mouth daily.    . naproxen (NAPROSYN) 500 MG tablet Take 500 mg by mouth 2 (two) times daily with a meal.    . neomycin-bacitracin-polymyxin (NEOSPORIN) ointment Apply 1 application topically as needed for wound care. Reported on 01/23/2016    . tamsulosin (FLOMAX) 0.4 MG CAPS capsule Take 0.4 mg by mouth.     No current facility-administered medications for this visit.      Physical Exam  Blood pressure (!) 149/86, pulse 67, temperature 97.5 F (36.4 C), height 5' 9.5" (1.765 m), weight 208 lb (94.3 kg).  Constitutional: overall normal hygiene, normal nutrition, well developed, normal grooming, normal body habitus. Assistive device:neoprene sleeve brace  Musculoskeletal: gait and station Limp right, muscle tone and strength are normal, no tremors or atrophy is present.  .  Neurological: coordination overall normal.  Deep tendon reflex/nerve stretch intact.  Sensation normal.  Cranial nerves II-XII  intact.   Skin:   Normal overall no scars, lesions, ulcers or rashes. No psoriasis.  Psychiatric: Alert and oriented x 3.  Recent memory intact, remote memory unclear.  Normal mood and affect. Well groomed.  Good eye contact.  Cardiovascular: overall no swelling, no varicosities, no edema bilaterally, normal temperatures of the legs and arms, no clubbing, cyanosis and good capillary refill.  Lymphatic: palpation is normal.  The right lower extremity is examined:  Inspection:  Thigh:  Non-tender and no defects  Knee has  swelling 1+ effusion.                        Joint tenderness is present                        Patient is tender over the medial joint line  Lower Leg:  Has normal appearance and no tenderness or defects  Ankle:  Non-tender and no defects  Foot:  Non-tender and no defects Range of Motion:  Knee:  Range of motion is: 0-105                        Crepitus is  present  Ankle:  Range of motion is normal. Strength and Tone:  The right lower extremity has normal strength and tone. Stability:  Knee:  The knee is stable.  Ankle:  The ankle is stable.  The left knee is negative.  The left elbow has slight tenderness of the lateral epicondyle area.  He has no bursitis of the olecranon area.  He has no redness.  ROM is full.  Right elbow is negative.  The patient has been educated about the nature of the problem(s) and counseled on treatment options.  The patient appeared to understand what I have discussed and is in agreement with it.  Encounter Diagnoses  Name Primary?  . Left elbow pain Yes  . Right knee pain     PLAN Call if any problems.  Precautions discussed.  Continue current medications.   Return to clinic 3 months   Electronically Signed Darreld McleanWayne Aviance [place]wood, MD 9/7/20173:06 PM

## 2016-08-07 ENCOUNTER — Telehealth: Payer: Self-pay | Admitting: Orthopaedic Surgery

## 2016-08-08 MED ORDER — HYDROCODONE-ACETAMINOPHEN 5-325 MG PO TABS: 1.0000 | ORAL_TABLET | Freq: Four times a day (QID) | ORAL | 0 refills | Status: DC | PRN

## 2016-08-09 ENCOUNTER — Encounter: Payer: Self-pay | Admitting: Orthopaedic Surgery

## 2016-09-09 ENCOUNTER — Telehealth: Payer: Self-pay | Admitting: Orthopaedic Surgery

## 2016-09-09 ENCOUNTER — Other Ambulatory Visit: Payer: Self-pay | Admitting: Orthopaedic Surgery

## 2016-09-09 MED ORDER — HYDROCODONE-ACETAMINOPHEN 5-325 MG PO TABS: 1.0000 | ORAL_TABLET | Freq: Four times a day (QID) | ORAL | 0 refills | Status: DC | PRN

## 2016-10-07 ENCOUNTER — Telehealth: Payer: Self-pay | Admitting: Orthopaedic Surgery

## 2016-10-07 ENCOUNTER — Other Ambulatory Visit: Payer: Self-pay | Admitting: Orthopaedic Surgery

## 2016-10-08 MED ORDER — HYDROCODONE-ACETAMINOPHEN 5-325 MG PO TABS: 1.0000 | ORAL_TABLET | Freq: Four times a day (QID) | ORAL | 0 refills | Status: DC | PRN

## 2016-10-24 ENCOUNTER — Telehealth: Payer: Self-pay | Admitting: Orthopaedic Surgery

## 2016-10-24 ENCOUNTER — Ambulatory Visit (INDEPENDENT_AMBULATORY_CARE_PROVIDER_SITE_OTHER): Payer: BLUE CROSS/BLUE SHIELD | Admitting: Orthopaedic Surgery

## 2016-10-24 ENCOUNTER — Encounter: Payer: Self-pay | Admitting: Orthopaedic Surgery

## 2016-10-24 VITALS — BP 143/81 | HR 72 | Temp 99.5°F | Ht 71.0 in | Wt 214.0 lb

## 2016-10-24 DIAGNOSIS — G8929 Other chronic pain: Secondary | ICD-10-CM

## 2016-10-24 DIAGNOSIS — M25561 Pain in right knee: Secondary | ICD-10-CM

## 2016-10-24 NOTE — Progress Notes (Signed)
CC:  I have pain of my right knee. I would like an injection.  The patient has chronic pain of the right knee.  There is no recent trauma.  There is no redness.  Injections in the past have helped.  The knee has no redness, has an effusion and crepitus present.  ROM of the right knee is 0-100.  Impression:  Chronic knee pain right  Return: 3 months  PROCEDURE NOTE:  The patient requests injections of the right knee , verbal consent was obtained.  The right knee was prepped appropriately after time out was performed.   Sterile technique was observed and injection of 1 cc of Depo-Medrol 40 mg with several cc's of plain xylocaine. Anesthesia was provided by ethyl chloride and a 20-gauge needle was used to inject the knee area. The injection was tolerated well.  A band aid dressing was applied.  The patient was advised to apply ice later today and tomorrow to the injection sight as needed.  Electronically Signed Darreld McleanWayne Cherly Erno, MD 12/7/20172:08 PM

## 2016-10-27 IMAGING — DX DG KNEE COMPLETE 4+V*R*
4 series · 4 of 4 positions shown · non-contrast
Comparison: 09/18/2012

CLINICAL DATA: Right knee pain, increased over the last 2-3 days.

EXAM:
RIGHT KNEE - COMPLETE 4+ VIEW

[knee ap]
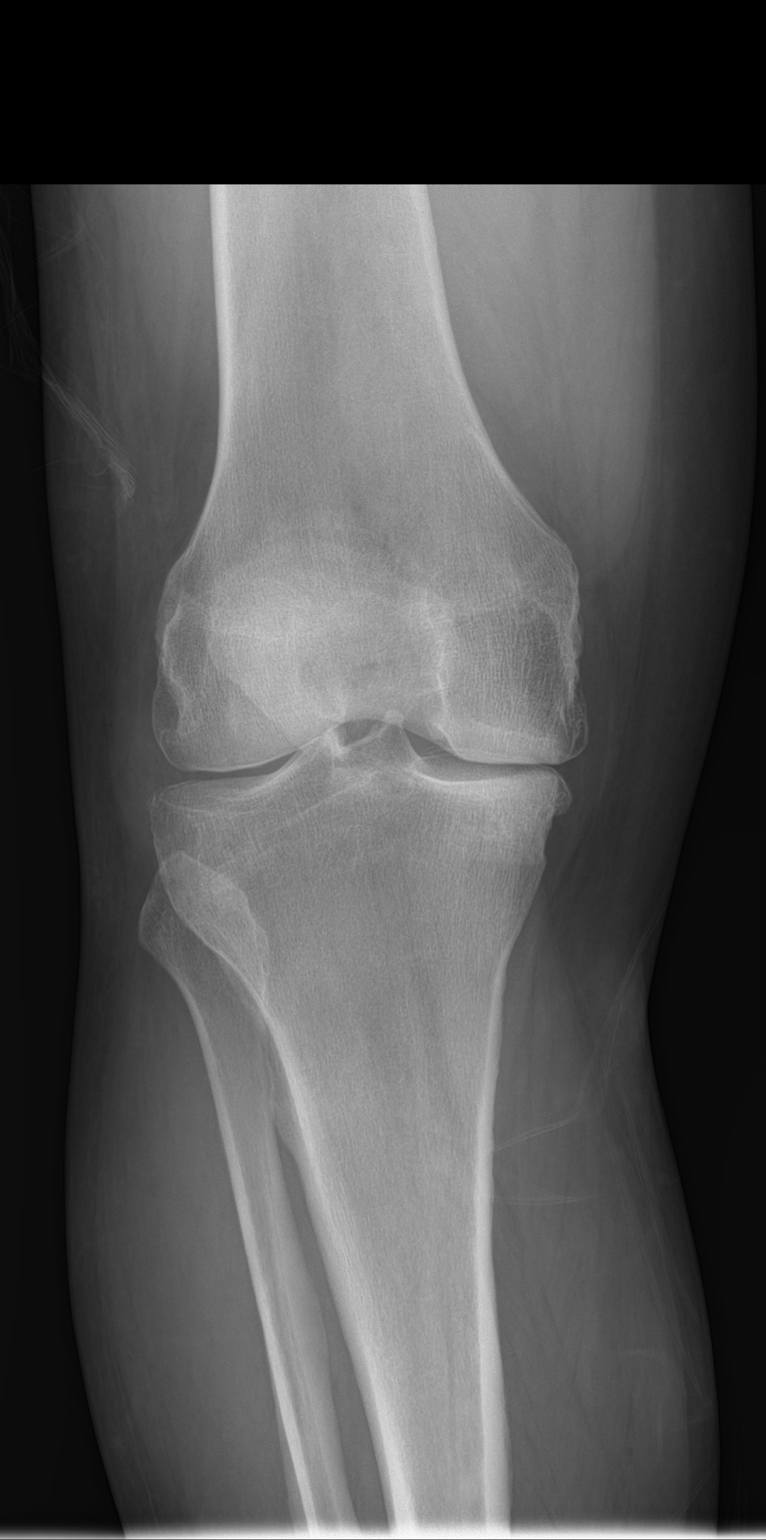

[knee obl (1 of 2)]
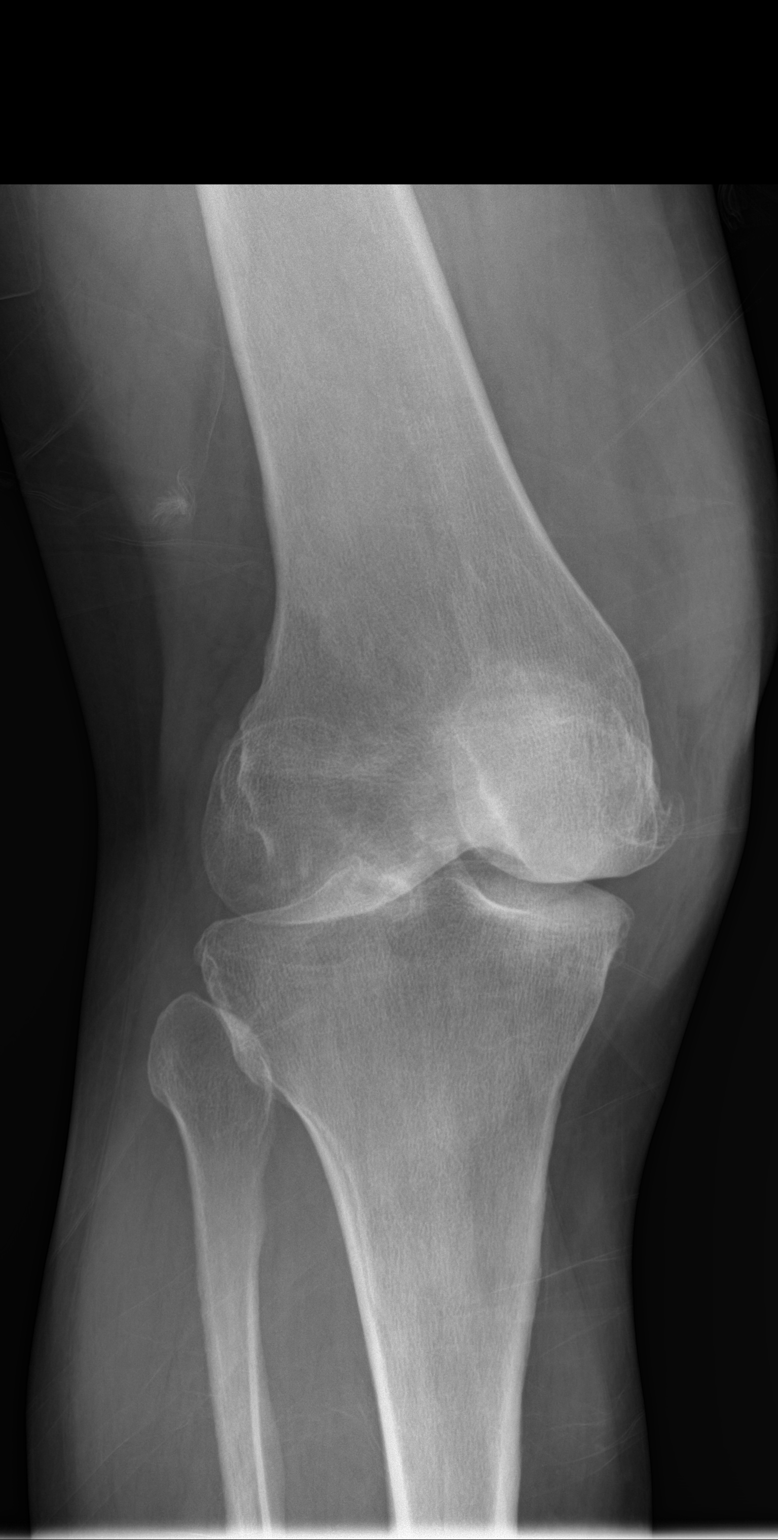

[knee lat]
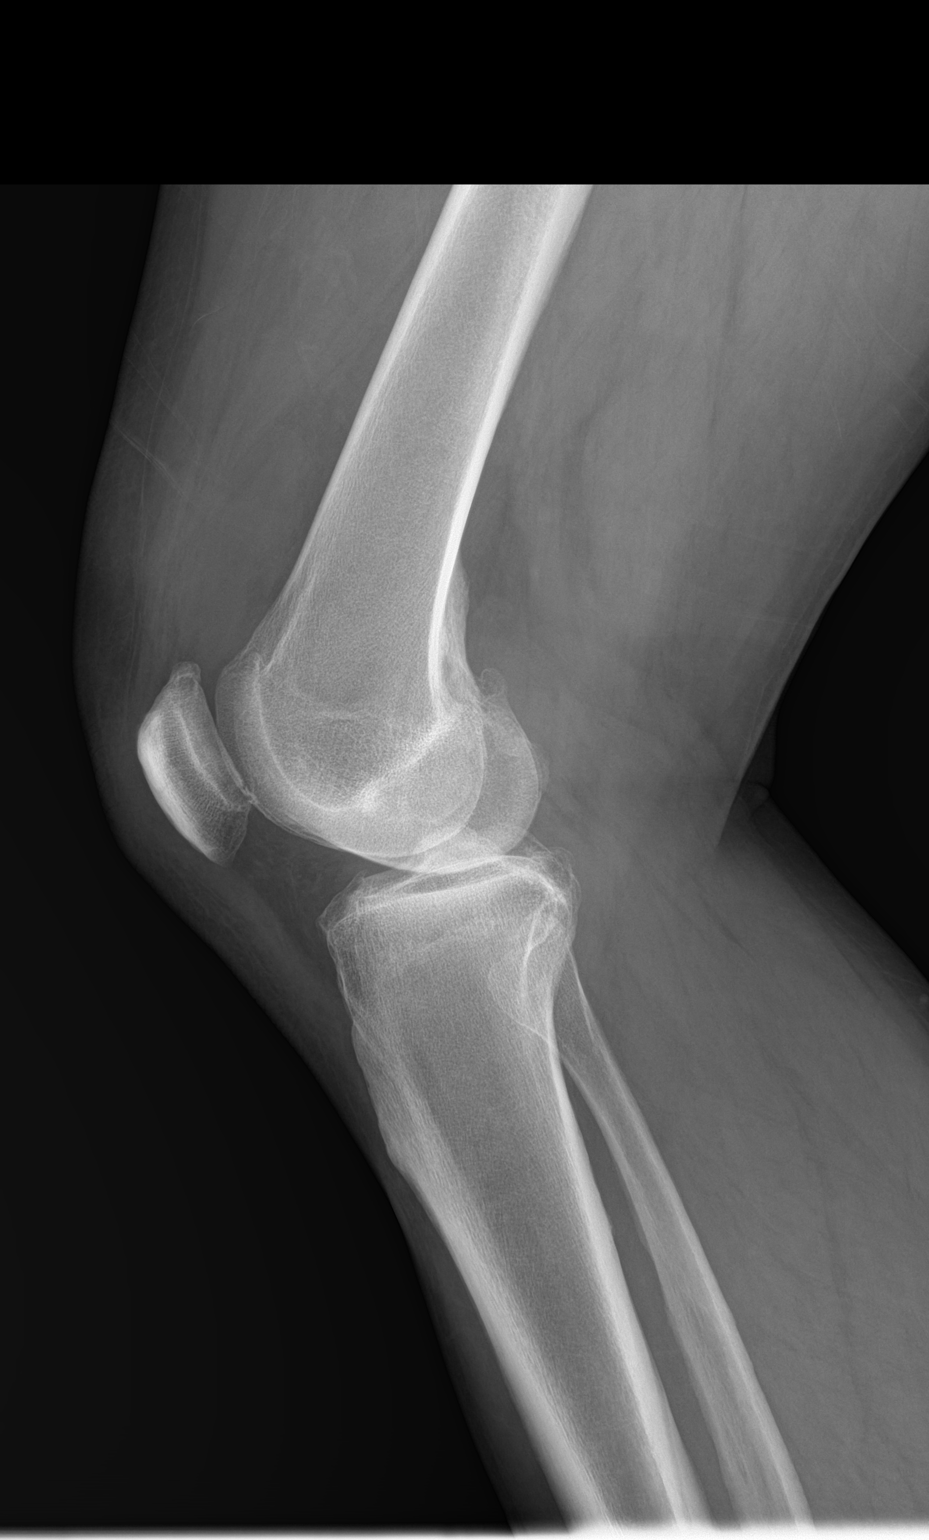

[knee obl (2 of 2)]
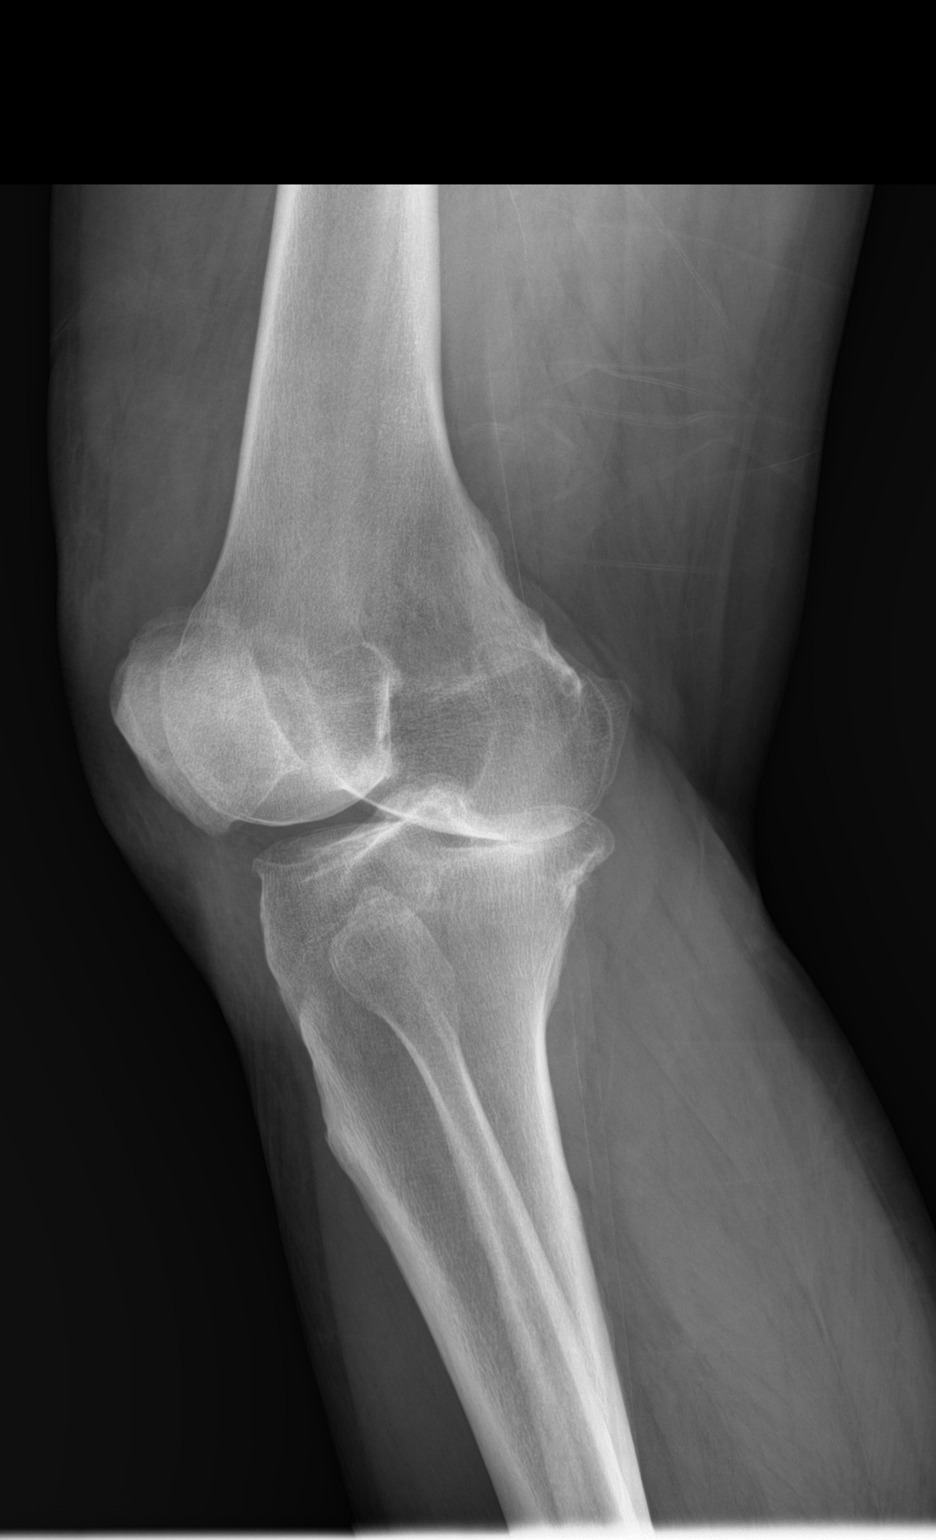

[4 of 4 positions shown; findings below may reference images not displayed]

FINDINGS: Progressive medial compartment osteoarthritis with increased joint
space narrowing and osteophytes. Unchanged osteoarthritis of the
patellofemoral compartment. There is a joint effusion and anterior
soft tissue edema. No fracture or dislocation.
IMPRESSION: Progressive osteoarthritis. No acute bony abnormality. Joint
effusion, similar to prior.

## 2016-11-05 ENCOUNTER — Telehealth: Payer: Self-pay | Admitting: Orthopaedic Surgery

## 2016-11-05 ENCOUNTER — Other Ambulatory Visit: Payer: Self-pay | Admitting: Orthopaedic Surgery

## 2016-11-06 MED ORDER — HYDROCODONE-ACETAMINOPHEN 5-325 MG PO TABS: 1.0000 | ORAL_TABLET | Freq: Four times a day (QID) | ORAL | 0 refills | Status: DC | PRN

## 2016-12-09 ENCOUNTER — Telehealth: Payer: Self-pay | Admitting: Orthopaedic Surgery

## 2016-12-10 MED ORDER — HYDROCODONE-ACETAMINOPHEN 5-325 MG PO TABS: 1.0000 | ORAL_TABLET | Freq: Four times a day (QID) | ORAL | 0 refills | Status: DC | PRN

## 2017-01-08 ENCOUNTER — Telehealth: Payer: Self-pay | Admitting: Orthopaedic Surgery

## 2017-01-08 MED ORDER — HYDROCODONE-ACETAMINOPHEN 5-325 MG PO TABS: 1.0000 | ORAL_TABLET | Freq: Four times a day (QID) | ORAL | 0 refills | Status: DC | PRN

## 2017-01-08 NOTE — Telephone Encounter (Signed)
Patient came by the office to request refill on Hydrocodone/Acetaminophen 5-325 mgs.   Qty  80   Sig: Take 1 tablet by mouth every 6 (six) hours as needed for moderate pain (Must last 30 days.Do not take and drive a car or use machinery.).         Patient came by the office to put in request because his internet is down which involves both his phone and his internet.

## 2017-01-28 ENCOUNTER — Ambulatory Visit: Payer: BLUE CROSS/BLUE SHIELD | Admitting: Orthopaedic Surgery

## 2017-02-06 ENCOUNTER — Ambulatory Visit (INDEPENDENT_AMBULATORY_CARE_PROVIDER_SITE_OTHER): Payer: No Typology Code available for payment source | Admitting: Orthopaedic Surgery

## 2017-02-06 ENCOUNTER — Encounter: Payer: Self-pay | Admitting: Orthopaedic Surgery

## 2017-02-06 ENCOUNTER — Telehealth: Payer: Self-pay | Admitting: Orthopedic Surgery

## 2017-02-06 VITALS — BP 134/73 | HR 68 | Temp 98.2°F | Ht 71.0 in | Wt 212.0 lb

## 2017-02-06 DIAGNOSIS — M7022 Olecranon bursitis, left elbow: Secondary | ICD-10-CM | POA: Diagnosis not present

## 2017-02-06 DIAGNOSIS — G8929 Other chronic pain: Secondary | ICD-10-CM

## 2017-02-06 DIAGNOSIS — M25561 Pain in right knee: Secondary | ICD-10-CM

## 2017-02-06 MED ORDER — HYDROCODONE-ACETAMINOPHEN 5-325 MG PO TABS: 1.0000 | ORAL_TABLET | Freq: Four times a day (QID) | ORAL | 0 refills | Status: DC | PRN

## 2017-02-06 NOTE — Progress Notes (Signed)
Patient ZO:XWRU:Melvin Turner, male DOB:05-01-1956, 61 y.o. [id number]:[id number]  Chief Complaint  Patient presents with  . Follow-up    right knee pain, left elbow pain    HPI  Melvin Turner is a 61 y.o. male who has right knee pain and resolving left elbow pain.  The right knee has good and bad days.  He has swelling and popping.  He is considering surgery and would like to see Dr. Romeo AppleHarrison now about this.  I will arrange.  He has no new trauma.  His left elbow bursa is very small and not bothering him today.  He has no new trauma. HPI  Body mass index is 29.57 kg/m.  ROS  Review of Systems  Constitutional:       Patient does not have Diabetes Mellitus. Patient does not have hypertension. Patient does not have COPD or shortness of breath. Patient does not have BMI > 35. Patient does not have current smoking history.  HENT: Negative for congestion.   Respiratory: Negative for cough and shortness of breath.   Cardiovascular: Negative for chest pain.  Endocrine: Positive for cold intolerance.  Musculoskeletal: Positive for arthralgias, gait problem, joint swelling and myalgias.  Allergic/Immunologic: Negative for environmental allergies.    Past Medical History:  Diagnosis Date  . Arthritis     Past Surgical History:  Procedure Laterality Date  . COLONOSCOPY    . KNEE ARTHROSCOPY Right 01/31/2015   Procedure: RIGHT KNEE ARTHROSCOPY, PARTIAL MEDIAL MENISECTOMY;  Surgeon: Darreld McleanWayne Aanshi Batchelder, MD;  Location: [facility];  Service: Orthopedics;  Laterality: Right;  Marland Kitchen. VASECTOMY      No family history on file.  Social History Social History  Substance Use Topics  . Smoking status: Former Smoker    Packs/day: 1.00    Years: 30.00    Types: Cigarettes    Quit date: 01/29/2006  . Smokeless tobacco: Never Used  . Alcohol use Yes     Comment: occ    Allergies  Allergen Reactions  . Poison Ivy Extract [Poison Ivy Extract] Hives and Rash    Current Outpatient Prescriptions  Medication Sig  Dispense Refill  . benazepril-hydrochlorthiazide (LOTENSIN HCT) 20-12.5 MG tablet Take 1 tablet by mouth daily.    Marland Kitchen. HYDROcodone-acetaminophen (NORCO/VICODIN) 5-325 MG tablet Take 1 tablet by mouth every 6 (six) hours as needed for moderate pain (Must last 30 days.Do not take and drive a car or use machinery.). 70 tablet 0  . Multiple Vitamin (MULTIVITAMIN WITH MINERALS) TABS tablet Take 1 tablet by mouth daily.    . naproxen (NAPROSYN) 500 MG tablet TAKE ONE TABLET BY MOUTH TWICE DAILY AFTER A MEAL 60 tablet 5  . neomycin-bacitracin-polymyxin (NEOSPORIN) ointment Apply 1 application topically as needed for wound care. Reported on 01/23/2016    . tamsulosin (FLOMAX) 0.4 MG CAPS capsule Take 0.4 mg by mouth.     No current facility-administered medications for this visit.      Physical Exam  Blood pressure 134/73, pulse 68, temperature 98.2 F (36.8 C), height 5\' 11"  (1.803 m), weight 212 lb (96.2 kg).  Constitutional: overall normal hygiene, normal nutrition, well developed, normal grooming, normal body habitus. Assistive device:none  Musculoskeletal: gait and station Limp right, muscle tone and strength are normal, no tremors or atrophy is present.  .  Neurological: coordination overall normal.  Deep tendon reflex/nerve stretch intact.  Sensation normal.  Cranial nerves II-XII intact.   Skin:   Normal overall no scars, lesions, ulcers or rashes. No psoriasis.  Psychiatric: Alert  and oriented x 3.  Recent memory intact, remote memory unclear.  Normal mood and affect. Well groomed.  Good eye contact.  Cardiovascular: overall no swelling, no varicosities, no edema bilaterally, normal temperatures of the legs and arms, no clubbing, cyanosis and good capillary refill.  Lymphatic: palpation is normal.  His left elbow has no pain and resolved bursa swelling.  ROM is full.  The right lower extremity is examined:  Inspection:  Thigh:  Non-tender and no defects  Knee has swelling 1+  effusion.                        Joint tenderness is present                        Patient is tender over the medial joint line  Lower Leg:  Has normal appearance and no tenderness or defects  Ankle:  Non-tender and no defects  Foot:  Non-tender and no defects Range of Motion:  Knee:  Range of motion is: 0-110                        Crepitus is  present  Ankle:  Range of motion is normal. Strength and Tone:  The right lower extremity has normal strength and tone. Stability:  Knee:  The knee is stable.  Ankle:  The ankle is stable.    The patient has been educated about the nature of the problem(s) and counseled on treatment options.  The patient appeared to understand what I have discussed and is in agreement with it.  Encounter Diagnoses  Name Primary?  . Chronic pain of right knee Yes  . Olecranon bursitis of left elbow     PLAN Call if any problems.  Precautions discussed.  Continue current medications.   Return to clinic See Dr. Romeo Apple about possible total knee on the right  I have reviewed the [facility] Controlled Substance Reporting System web site prior to prescribing narcotic medicine for this patient.   Electronically Signed Darreld Mclean, MD 3/22/20189:40 AM

## 2017-02-06 NOTE — Telephone Encounter (Signed)
Dr. Hilda LiasKeeling has referred this patient to you for a surgery consult TKA. Please advise when to schedule an appointment with you.

## 2017-02-06 NOTE — Telephone Encounter (Signed)
ANYTIME IS GOOD

## 2017-03-11 ENCOUNTER — Telehealth: Payer: Self-pay | Admitting: Orthopedic Surgery

## 2017-03-11 ENCOUNTER — Telehealth: Payer: Self-pay | Admitting: Orthopaedic Surgery

## 2017-03-11 NOTE — Telephone Encounter (Signed)
Called patient, left voice message to return call regarding scheduling appointment with Dr Romeo Apple.  Stressed importance of scheduling this appointment.

## 2017-03-11 NOTE — Telephone Encounter (Signed)
Patient requests refill:  HYDROcodone-acetaminophen (NORCO/VICODIN) 5-325 MG tablet 70 tablet   - in process of having MyChart account password re-set; therefore, unable to do online.

## 2017-03-12 MED ORDER — HYDROCODONE-ACETAMINOPHEN 5-325 MG PO TABS: 1.0000 | ORAL_TABLET | Freq: Four times a day (QID) | ORAL | 0 refills | Status: DC | PRN

## 2017-03-12 NOTE — Telephone Encounter (Signed)
Patient/wife came by office, scheduled appointment; patient aware.

## 2017-03-19 ENCOUNTER — Ambulatory Visit (INDEPENDENT_AMBULATORY_CARE_PROVIDER_SITE_OTHER): Payer: No Typology Code available for payment source | Admitting: Orthopedic Surgery

## 2017-03-19 ENCOUNTER — Encounter: Payer: Self-pay | Admitting: Orthopedic Surgery

## 2017-03-19 VITALS — BP 125/71 | HR 71 | Ht 70.0 in | Wt 205.0 lb

## 2017-03-19 DIAGNOSIS — G8929 Other chronic pain: Secondary | ICD-10-CM

## 2017-03-19 DIAGNOSIS — M1711 Unilateral primary osteoarthritis, right knee: Secondary | ICD-10-CM | POA: Diagnosis not present

## 2017-03-19 DIAGNOSIS — M25561 Pain in right knee: Secondary | ICD-10-CM

## 2017-03-19 NOTE — Progress Notes (Signed)
  NEW PATIENT OFFICE VISIT    Chief Complaint  Patient presents with  . Knee Pain    Right knee pain, referred by Dr Hilda Lias for surgery.    HPI 61 year old male referred to me by Dr. Hilda Lias for possible knee replacement surgery. He's had a long-standing history of medial right knee pain trouble bending his knee trouble squatting and bending. 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 He has been treated with appropriate nonoperative measures including but not limited to oral medication activity modification appropriate exercises and injection   Review of Systems  All other systems reviewed and are negative.    Past Medical History:  Diagnosis Date  . Arthritis     Past Surgical History:  Procedure Laterality Date  . COLONOSCOPY    . KNEE ARTHROSCOPY Right 01/31/2015   Procedure: RIGHT KNEE ARTHROSCOPY, PARTIAL MEDIAL MENISECTOMY;  Surgeon: Darreld Mclean, MD;  Location: [facility];  Service: Orthopedics;  Laterality: Right;  Marland Kitchen VASECTOMY      No family history on file. Social History  Substance Use Topics  . Smoking status: Former Smoker    Packs/day: 1.00    Years: 30.00    Types: Cigarettes    Quit date: 01/29/2006  . Smokeless tobacco: Never Used  . Alcohol use Yes     Comment: occ    BP 125/71   Pulse 71   Ht  (1.778 m)   Wt 205 lb (93 kg)   BMI 29.41 kg/m   Physical Exam  Constitutional: He is oriented to person, place, and time. He appears well-developed and well-nourished. No distress.  Cardiovascular: Normal rate and intact distal pulses.   Neurological: He is alert and oriented to person, place, and time.  Skin: Skin is warm and dry. No rash noted. He is not diaphoretic. No erythema. No pallor.  Psychiatric: He has a normal mood and affect. His behavior is normal. Judgment and thought content normal.    Ortho Exam Inspection of the right knee reveals a slightly varus knee with 110 of flexion full extension. Tenderness of  the medial compartment without effusion. Muscle tone normal. Knee stability normal. Skin without erythema. Pulse and perfusion normal. Peripheral edema. Normal sensation  On the left knee we find again slightly varus knee 125 of flexion mild tenderness medial compartment no effusion muscle tone normal strength normal skin intact without erythema pulse and perfusion normal no edema and normal sensation No orders of the defined types were placed in this encounter.   Encounter Diagnosis  Name Primary?  . Chronic pain of right knee Yes   Plain films show varus knee with subchondral bone formation medially and osteophyte formation significant joint space narrowing good bone quality  Encounter Diagnoses  Name Primary?  . Chronic pain of right knee Yes  . Primary osteoarthritis of right knee      PLAN:  Right total knee  Patient was to have surgery in November This procedure has been fully reviewed with the patient and written informed consent has been obtained.

## 2017-03-19 NOTE — Patient Instructions (Signed)
You have decided to proceed with knee replacement surgery. You have decided not to continue with nonoperative measures such as but not limited to oral medication, weight loss, activity modification, physical therapy, bracing, or injection.  We will perform the procedure commonly known as total knee replacement. Some of the risks associated with knee replacement surgery include but are not limited to Bleeding Infection Swelling Stiffness Blood clot Pain that persists even after surgery  Infection is especially devastating complication of knee surgery although rare. If infection does occur your implant will usually have to be removed and several surgeries and antibiotics will be needed to eradicate the infection prior to performing a repeat replacement.   In some cases amputation is required to eradicate the infection. In other rare cases a knee fusion is needed    If you're not comfortable with these risks and would like to continue with nonoperative treatment please let Dr. Harrison know prior to your surgery.  

## 2017-04-10 ENCOUNTER — Other Ambulatory Visit: Payer: Self-pay | Admitting: Orthopaedic Surgery

## 2017-04-10 ENCOUNTER — Telehealth: Payer: Self-pay | Admitting: Orthopaedic Surgery

## 2017-04-10 MED ORDER — HYDROCODONE-ACETAMINOPHEN 5-325 MG PO TABS: 1.0000 | ORAL_TABLET | Freq: Four times a day (QID) | ORAL | 0 refills | Status: DC | PRN

## 2017-05-07 ENCOUNTER — Ambulatory Visit (INDEPENDENT_AMBULATORY_CARE_PROVIDER_SITE_OTHER): Payer: No Typology Code available for payment source | Admitting: Orthopaedic Surgery

## 2017-05-07 ENCOUNTER — Encounter: Payer: Self-pay | Admitting: Orthopaedic Surgery

## 2017-05-07 VITALS — BP 119/71 | HR 82 | Ht 71.0 in | Wt 208.0 lb

## 2017-05-07 DIAGNOSIS — M25562 Pain in left knee: Secondary | ICD-10-CM | POA: Diagnosis not present

## 2017-05-07 DIAGNOSIS — M25561 Pain in right knee: Secondary | ICD-10-CM | POA: Diagnosis not present

## 2017-05-07 DIAGNOSIS — G8929 Other chronic pain: Secondary | ICD-10-CM

## 2017-05-07 MED ORDER — HYDROCODONE-ACETAMINOPHEN 5-325 MG PO TABS: 1.0000 | ORAL_TABLET | Freq: Four times a day (QID) | ORAL | 0 refills | Status: DC | PRN

## 2017-05-07 NOTE — Progress Notes (Signed)
CC: Both of my knees are hurting. I would like an injection in both knees.  The patient has had chronic pain and tenderness of both knees for some time.  Injections help.  There is no locking or giving way of the knee.  There is no new trauma. There is no redness or signs of infections.  The knees have a mild effusion and some crepitus.  There is no redness or signs of recent trauma.  Right knee ROM is 0-95 and left knee ROM is 0-110.  Impression:  Chronic pain of the both knees  Return:  prn  PROCEDURE NOTE:  The patient requests injections of both knees, verbal consent was obtained.  The left and right knee were individually prepped appropriately after time out was performed.   Sterile technique was observed and injection of 1 cc of Depo-Medrol 40 mg with several cc's of plain xylocaine. Anesthesia was provided by ethyl chloride and a 20-gauge needle was used to inject each knee area. The injections were tolerated well.  A band aid dressing was applied.  The patient was advised to apply ice later today and tomorrow to the injection sight as needed.  I have reviewed the [place] Controlled Substance Reporting [place] web site prior to prescribing narcotic medicine for this patient.  Call if any problem.  Precautions discussed.   Electronically Signed Darreld McleanWayne Afton Mikelson, MD 6/20/20189:57 AM

## 2017-06-10 ENCOUNTER — Telehealth: Payer: Self-pay | Admitting: Orthopaedic Surgery

## 2017-06-10 ENCOUNTER — Other Ambulatory Visit: Payer: Self-pay | Admitting: Orthopaedic Surgery

## 2017-06-11 MED ORDER — HYDROCODONE-ACETAMINOPHEN 5-325 MG PO TABS: 1.0000 | ORAL_TABLET | Freq: Four times a day (QID) | ORAL | 0 refills | Status: DC | PRN

## 2017-07-09 ENCOUNTER — Telehealth: Payer: Self-pay | Admitting: Orthopaedic Surgery

## 2017-07-09 ENCOUNTER — Other Ambulatory Visit: Payer: Self-pay | Admitting: Orthopaedic Surgery

## 2017-07-09 MED ORDER — HYDROCODONE-ACETAMINOPHEN 5-325 MG PO TABS: 1.0000 | ORAL_TABLET | Freq: Four times a day (QID) | ORAL | 0 refills | Status: DC | PRN

## 2017-08-04 ENCOUNTER — Telehealth: Payer: Self-pay | Admitting: Orthopaedic Surgery

## 2017-08-12 ENCOUNTER — Telehealth: Payer: Self-pay | Admitting: Orthopaedic Surgery

## 2017-08-20 MED ORDER — HYDROCODONE-ACETAMINOPHEN 5-325 MG PO TABS: 1.0000 | ORAL_TABLET | Freq: Four times a day (QID) | ORAL | 0 refills | Status: DC | PRN

## 2017-09-30 ENCOUNTER — Telehealth: Payer: Self-pay | Admitting: Orthopaedic Surgery

## 2017-10-01 MED ORDER — HYDROCODONE-ACETAMINOPHEN 5-325 MG PO TABS: 1.0000 | ORAL_TABLET | Freq: Four times a day (QID) | ORAL | 0 refills | Status: DC | PRN

## 2017-10-01 NOTE — Telephone Encounter (Signed)
Routing to DR. Keeling 

## 2017-11-04 ENCOUNTER — Other Ambulatory Visit: Payer: Self-pay | Admitting: Orthopaedic Surgery

## 2017-11-04 NOTE — Telephone Encounter (Signed)
cont'd - 11/04/17; in reference to patient's request for refill of pain medication, last office visit was 05/07/17.  Please advise.

## 2017-12-24 ENCOUNTER — Ambulatory Visit: Payer: No Typology Code available for payment source | Admitting: Orthopaedic Surgery

## 2017-12-24 ENCOUNTER — Encounter: Payer: Self-pay | Admitting: Orthopaedic Surgery

## 2017-12-24 VITALS — BP 131/75 | HR 82 | Ht 71.0 in | Wt 219.0 lb

## 2017-12-24 DIAGNOSIS — M25561 Pain in right knee: Secondary | ICD-10-CM

## 2017-12-24 DIAGNOSIS — G8929 Other chronic pain: Secondary | ICD-10-CM | POA: Diagnosis not present

## 2017-12-24 MED ORDER — HYDROCODONE-ACETAMINOPHEN 5-325 MG PO TABS: 1.0000 | ORAL_TABLET | Freq: Four times a day (QID) | ORAL | 0 refills | Status: DC | PRN

## 2017-12-24 NOTE — Progress Notes (Signed)
CC:  I have pain of my right knee. I would like an injection.  The patient has chronic pain of the right knee.  There is no recent trauma.  There is no redness.  Injections in the past have helped.  The knee has no redness, has an effusion and crepitus present.  ROM of the right knee is 0-105.  Impression:  Chronic knee pain right  Return: to see Dr. Romeo AppleHarrison  PROCEDURE NOTE:  The patient requests injections of the right knee , verbal consent was obtained.  The right knee was prepped appropriately after time out was performed.   Sterile technique was observed and injection of 1 cc of Depo-Medrol 40 mg with several cc's of plain xylocaine. Anesthesia was provided by ethyl chloride and a 20-gauge needle was used to inject the knee area. The injection was tolerated well.  A band aid dressing was applied.  The patient was advised to apply ice later today and tomorrow to the injection sight as needed.  I have reviewed the [place] Controlled Substance Reporting System web site prior to prescribing narcotic medicine for this patient.  To see Dr. Romeo AppleHarrison for a total knee.  He was worked up last year to have a total knee but the patient declined after seeing Dr. Romeo AppleHarrison and said he would wait a while.  Now he has more and more pain and would like to have a total knee.  Electronically Signed Darreld McleanWayne Maleya Leever, MD 2/6/20193:24 PM

## 2018-01-07 ENCOUNTER — Ambulatory Visit: Payer: No Typology Code available for payment source | Admitting: Orthopedic Surgery

## 2018-01-07 ENCOUNTER — Encounter: Payer: Self-pay | Admitting: Orthopedic Surgery

## 2018-01-07 ENCOUNTER — Ambulatory Visit (INDEPENDENT_AMBULATORY_CARE_PROVIDER_SITE_OTHER): Payer: No Typology Code available for payment source

## 2018-01-07 ENCOUNTER — Ambulatory Visit: Payer: No Typology Code available for payment source

## 2018-01-07 VITALS — BP 118/76 | HR 79 | Ht 70.0 in | Wt 220.0 lb

## 2018-01-07 DIAGNOSIS — M1711 Unilateral primary osteoarthritis, right knee: Secondary | ICD-10-CM

## 2018-01-07 DIAGNOSIS — G8929 Other chronic pain: Secondary | ICD-10-CM | POA: Diagnosis not present

## 2018-01-07 DIAGNOSIS — M25562 Pain in left knee: Secondary | ICD-10-CM

## 2018-01-07 DIAGNOSIS — M25561 Pain in right knee: Secondary | ICD-10-CM

## 2018-01-07 NOTE — Patient Instructions (Addendum)
You have decided to proceed with knee replacement surgery. You have decided not to continue with nonoperative measures such as but not limited to oral medication, weight loss, activity modification, physical therapy, bracing, or injection.  We will perform the procedure commonly known as total knee replacement. Some of the risks associated with knee replacement surgery include but are not limited to Bleeding Infection Swelling Stiffness Blood clot Pain that persists even after surgery  Infection is especially devastating complication of knee surgery although rare. If infection does occur your implant will usually have to be removed and several surgeries and antibiotics will be needed to eradicate the infection prior to performing a repeat replacement.   In some cases amputation is required to eradicate the infection. In other rare cases a knee fusion is needed    If you're not comfortable with these risks and would like to continue with nonoperative treatment please let Dr. Romeo AppleHarrison know prior to your surgery.   Do not take anymore hydrocodone after Feb 17

## 2018-01-07 NOTE — Progress Notes (Signed)
PREOP CONSULT/REFERRAL INTRA-OFFICE FROM DR Gaylene Brooks   Chief Complaint  Patient presents with  . Knee Pain    right     62 year old male presents for evaluation of painful right knee, previously scheduled for right total knee but declined comes back for reevaluation for possible right total knee arthroplasty complains of a dull aching medial pain over the right knee medial joint line for several years.  Status post arthroscopy.  This is associated with difficulty walking bending climbing and squatting.    Review of Systems  Respiratory: Negative.   Cardiovascular: Negative.   All other systems reviewed and are negative.    Past Medical History:  Diagnosis Date  . Arthritis     Past Surgical History:  Procedure Laterality Date  . COLONOSCOPY    . KNEE ARTHROSCOPY Right 01/31/2015   Procedure: RIGHT KNEE ARTHROSCOPY, PARTIAL MEDIAL MENISECTOMY;  Surgeon: Darreld Mclean, MD;  Location: [facility];  Service: Orthopedics;  Laterality: Right;  Marland Kitchen VASECTOMY      History reviewed. No pertinent family history. Social History   Tobacco Use  . Smoking status: Former Smoker    Packs/day: 1.00    Years: 30.00    Pack years: 30.00    Types: Cigarettes    Last attempt to quit: 01/29/2006    Years since quitting: 11.9  . Smokeless tobacco: Never Used  Substance Use Topics  . Alcohol use: Yes    Comment: occ  . Drug use: No    Allergies  Allergen Reactions  . Poison Ivy Extract [Poison Ivy Extract] Hives and Rash     Current Meds  Medication Sig  . amLODipine (NORVASC) 5 MG tablet   . benazepril-hydrochlorthiazide (LOTENSIN HCT) 20-12.5 MG tablet Take 1 tablet by mouth daily.  Marland Kitchen HYDROcodone-acetaminophen (NORCO/VICODIN) 5-325 MG tablet Take 1 tablet by mouth every 6 (six) hours as needed for moderate pain (Must last 30 days.Do not take and drive a car or use machinery.).  Marland Kitchen Multiple Vitamin (MULTIVITAMIN WITH MINERALS) TABS tablet Take 1 tablet by mouth daily.  . naproxen  (NAPROSYN) 500 MG tablet TAKE ONE TABLET BY MOUTH TWICE DAILY AFTER  A  MEAL  . omeprazole (PRILOSEC) 20 MG capsule   . tamsulosin (FLOMAX) 0.4 MG CAPS capsule Take 0.4 mg by mouth.    BP 118/76   Pulse 79   Ht 5\' 10"  (1.778 m)   Wt 220 lb (99.8 kg)   BMI 31.57 kg/m   Physical Exam  Constitutional: He is oriented to person, place, and time. He appears well-developed and well-nourished.  Vital signs have been reviewed and are stable. Gen. appearance the patient is well-developed and well-nourished with normal grooming and hygiene.   Musculoskeletal:  GAIT IS antalgic  Neurological: He is alert and oriented to person, place, and time.  Skin: Skin is warm and dry. No erythema.  Psychiatric: He has a normal mood and affect.  Vitals reviewed.   Right Knee Exam   Muscle Strength  The patient has normal right knee strength.  Tenderness  The patient is experiencing tenderness in the medial joint line.  Range of Motion  Extension:  5 normal  Flexion:  110 normal   Tests  McMurray:  Medial - negative Lateral - negative Varus: negative Valgus: negative Drawer:  Anterior - negative    Posterior - negative  Other  Erythema: absent Scars: absent Sensation: normal Pulse: present Swelling: none   Left Knee Exam   Muscle Strength  The patient has normal left  knee strength.  Tenderness  The patient is experiencing tenderness in the medial joint line.  Range of Motion  Extension:  5 normal  Flexion: normal Left knee flexion: 115.   Tests  McMurray:  Medial - negative Lateral - negative Varus: negative Valgus: negative Drawer:  Anterior - negative     Posterior - negative  Other  Erythema: absent Scars: absent Sensation: normal Pulse: present Swelling: none        Encounter Diagnoses  Name Primary?  . Chronic pain of right knee Yes  . Chronic pain of left knee   . Primary osteoarthritis of right knee      PLAN:   The procedure has been fully  reviewed with the patient; The risks and benefits of surgery have been discussed and explained and understood. Alternative treatment has also been reviewed, questions were encouraged and answered. The postoperative plan is also been reviewed.   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  He will be on opioid holiday for 30 days   Right tka   March 19

## 2018-01-08 ENCOUNTER — Encounter: Payer: Self-pay | Admitting: Orthopedic Surgery

## 2018-01-13 ENCOUNTER — Telehealth: Payer: Self-pay | Admitting: Radiology

## 2018-01-13 NOTE — Telephone Encounter (Signed)
Have gotten approval.

## 2018-01-13 NOTE — Telephone Encounter (Signed)
Approval is date specific. Only for March 19th

## 2018-01-13 NOTE — Telephone Encounter (Signed)
I called [facility] spoke to Marchelle Folksmanda to get prior authorization for the TKR [id number] Ref number for the case is [id number] need to fax clinicals for review.Fax [phone]   Faxed clinicals / pending review

## 2018-01-22 ENCOUNTER — Other Ambulatory Visit: Payer: Self-pay | Admitting: Orthopedic Surgery

## 2018-01-22 DIAGNOSIS — M1711 Unilateral primary osteoarthritis, right knee: Secondary | ICD-10-CM

## 2018-01-26 NOTE — Patient Instructions (Signed)
Melvin CarinaGary F Gibbon  01/26/2018     @PREFPERIOPPHARMACY @   Your procedure is scheduled on 02/03/2018.  [facility]  Call this number if you have problems the morning of surgery:  [phone]   Remember:  Do not eat food or drink liquids after midnight.  Take these medicines the morning of surgery with A SIP OF WATER Amlodipine, Lotensin, Hydrocodone if needed, Prilosec, Flomax   Do not wear jewelry, make-up or nail polish.  Do not wear lotions, powders, or perfumes, or deodorant.  Do not shave 48 hours prior to surgery.  Men may shave face and neck.  Do not bring valuables to the hospital.  [facility] any belongings or valuables.  Contacts, dentures or bridgework may not be worn into surgery.  Leave your suitcase in the car.  After surgery it may be brought to your room.  For patients admitted to the hospital, discharge time will be determined by your treatment team.  Patients discharged the day of surgery will not be allowed to drive home.    Please read over the following fact sheets that you were given. Surgical Site Infection Prevention and Anesthesia Post-op Instructions     PATIENT INSTRUCTIONS POST-ANESTHESIA  IMMEDIATELY FOLLOWING SURGERY:  Do not drive or operate machinery for the first twenty four hours after surgery.  Do not make any important decisions for twenty four hours after surgery or while taking narcotic pain medications or sedatives.  If you develop intractable nausea and vomiting or a severe headache please notify your doctor immediately.  FOLLOW-UP:  Please make an appointment with your surgeon as instructed. You do not need to follow up with anesthesia unless specifically instructed to do so.  WOUND CARE INSTRUCTIONS (if applicable):  Keep a dry clean dressing on the anesthesia/puncture wound site if there is drainage.  Once the wound has quit draining you may leave it open to air.  Generally you should leave  the bandage intact for twenty four hours unless there is drainage.  If the epidural site drains for more than 36-48 hours please call the anesthesia department.  QUESTIONS?:  Please feel free to call your physician or the hospital operator if you have any questions, and they will be happy to assist you.      Total Knee Replacement Total knee replacement is a procedure to replace the knee joint with an artificial (prosthetic) knee joint. The purpose of this surgery is to reduce knee pain and improve knee function. The prosthetic knee joint (prosthesis) may be made of metal, plastic or ceramic. It replaces parts of the thigh bone (femur), lower leg bone (tibia), and kneecap (patella) that are removed during the procedure. Tell a health care provider about:  Any allergies you have.  All medicines you are taking, including vitamins, herbs, eye drops, creams, and over-the-counter medicines.  Any problems you or family members have had with anesthetic medicines.  Any blood disorders you have.  Any surgeries you have had.  Any medical conditions you have.  Whether you are pregnant or may be pregnant. What are the risks? Generally, this is a safe procedure. However, problems may occur, including:  Infection.  Bleeding.  Allergic reactions to medicines.  Damage to other structures or organs.  Decreased range of motion of the knee.  Instability of the knee.  Loosening of the prosthetic joint.  Knee pain that does not go away (chronic pain).  What happens before the procedure?  Ask your health care provider about: ? Changing or stopping your regular medicines. This is especially important if you are taking diabetes medicines or blood thinners. ? Taking medicines such as aspirin and ibuprofen. These medicines can thin your blood. Do not take these medicines before your procedure if your health care provider instructs you not to.  Have dental care and routine cleanings completed  before your procedure. Plan to not have dental work done for 3 months after your procedure. Germs from anywhere in your body, including your mouth, can travel to your new joint and infect it.  Follow instructions from your health care provider about eating or drinking restrictions.  Ask your health care provider how your surgical site will be marked or identified.  You may be given antibiotic medicine to help prevent infection.  If your health care provider prescribes physical therapy, do exercises as instructed.  Do not use any tobacco products, such as cigarettes, chewing tobacco, or e-cigarettes. If you need help quitting, ask your health care provider.  You may have a physical exam.  You may have tests, such as: ? X-rays. ? MRI. ? CT scan. ? Bone scans.  You may have a blood or urine sample taken.  Plan to have someone take you home after the procedure.  If you will be going home right after the procedure, plan to have someone with you for at least 24 hours. It is recommended that you have someone to help care for you for at least 4-6 weeks after your procedure. What happens during the procedure?  To reduce your risk of infection: ? Your health care team will wash or sanitize their hands. ? Your skin will be washed with soap.  An IV tube will be inserted into one of your veins.  You will be given one or more of the following: ? A medicine to help you relax (sedative). ? A medicine to numb the area (local anesthetic). ? A medicine to make you fall asleep (general anesthetic). ? A medicine that is injected into your spine to numb the area below and slightly above the injection site (spinal anesthetic). ? A medicine that is injected into an area of your body to numb everything below the injection site (regional anesthetic).  An incision will be made in your knee.  Damaged cartilage and bone will be removed from your femur, tibia, and patella.  Parts of the prosthesis  (liners) will be placed over the areas of bone and cartilage that were removed. A metal liner will be placed over your femur, and plastic liners will be placed over your tibia and the underside of your patella.  One or more small tubes (drains) may be placed near your incision to help drain extra fluid from your surgical site.  Your incision will be closed with stitches (sutures), skin glue, or adhesive strips. Medicine may be applied to your incision.  A bandage (dressing) will be placed over your incision. The procedure may vary among health care providers and hospitals. What happens after the procedure?  Your blood pressure, heart rate, breathing rate, and blood oxygen level will be monitored often until the medicines you were given have worn off.  You may continue to receive fluids and medicines through an IV tube.  You will have some pain. Pain medicines will be available to help you.  You may have fluid coming from one or more drains in your incision.  You may have to wear compression stockings. These stockings help to  prevent blood clots and reduce swelling in your legs.  You will be encouraged to move around as much as possible.  You may be given a continuous passive motion machine to use at home. You will be shown how to use this machine.  Do not drive for 24 hours if you received a sedative. This information is not intended to replace advice given to you by your health care provider. Make sure you discuss any questions you have with your health care provider. Document Released: 02/10/2001 Document Revised: 12/07/2016 Document Reviewed: 10/11/2015 Elsevier Interactive Patient Education  [employer].

## 2018-01-29 ENCOUNTER — Encounter (HOSPITAL_COMMUNITY)
Admission: RE | Admit: 2018-01-29 | Discharge: 2018-01-29 | Disposition: A | Discharge status: Home / Self Care | Payer: No Typology Code available for payment source | Source: Ambulatory Visit | Attending: Orthopedic Surgery | Admitting: Orthopedic Surgery

## 2018-01-29 ENCOUNTER — Encounter (HOSPITAL_COMMUNITY): Payer: Self-pay

## 2018-01-29 ENCOUNTER — Other Ambulatory Visit: Payer: Self-pay

## 2018-01-29 DIAGNOSIS — Z01810 Encounter for preprocedural cardiovascular examination: Secondary | ICD-10-CM | POA: Insufficient documentation

## 2018-01-29 DIAGNOSIS — M1711 Unilateral primary osteoarthritis, right knee: Secondary | ICD-10-CM | POA: Insufficient documentation

## 2018-01-29 DIAGNOSIS — Z01818 Encounter for other preprocedural examination: Secondary | ICD-10-CM | POA: Insufficient documentation

## 2018-01-29 DIAGNOSIS — R9431 Abnormal electrocardiogram [ECG] [EKG]: Secondary | ICD-10-CM | POA: Diagnosis not present

## 2018-01-29 HISTORY — DX: Essential (primary) hypertension: I10

## 2018-01-29 HISTORY — DX: Gastro-esophageal reflux disease without esophagitis: K21.9

## 2018-01-29 HISTORY — DX: Benign prostatic hyperplasia without lower urinary tract symptoms: N40.0

## 2018-01-29 LAB — PROTIME-INR
INR: 0.95
Prothrombin Time: 12.5 seconds (ref 11.4–15.2)

## 2018-01-29 LAB — SURGICAL PCR SCREEN
MRSA, PCR: NEGATIVE
Staphylococcus aureus: NEGATIVE

## 2018-01-29 LAB — CBC WITH DIFFERENTIAL/PLATELET
Basophils Absolute: 0 10*3/uL (ref 0.0–0.1)
Basophils Relative: 0 %
Eosinophils Absolute: 0.3 10*3/uL (ref 0.0–0.7)
Eosinophils Relative: 3 %
HCT: 40.9 % (ref 39.0–52.0)
Hemoglobin: 13 g/dL (ref 13.0–17.0)
Lymphocytes Relative: 68 %
Lymphs Abs: 6.1 10*3/uL — ABNORMAL HIGH (ref 0.7–4.0)
MCH: 29.7 pg (ref 26.0–34.0)
MCHC: 31.8 g/dL (ref 30.0–36.0)
MCV: 93.6 fL (ref 78.0–100.0)
Monocytes Absolute: 0.3 10*3/uL (ref 0.1–1.0)
Monocytes Relative: 4 %
Neutro Abs: 2.3 10*3/uL (ref 1.7–7.7)
Neutrophils Relative %: 25 %
Platelets: 231 10*3/uL (ref 150–400)
RBC: 4.37 MIL/uL (ref 4.22–5.81)
RDW: 13.4 % (ref 11.5–15.5)
WBC: 8.9 10*3/uL (ref 4.0–10.5)

## 2018-01-29 LAB — COMPREHENSIVE METABOLIC PANEL
ALT: 22 U/L (ref 17–63)
AST: 22 U/L (ref 15–41)
Albumin: 3.9 g/dL (ref 3.5–5.0)
Alkaline Phosphatase: 60 U/L (ref 38–126)
Anion gap: 10 (ref 5–15)
BUN: 17 mg/dL (ref 6–20)
CO2: 23 mmol/L (ref 22–32)
Calcium: 8.9 mg/dL (ref 8.9–10.3)
Chloride: 108 mmol/L (ref 101–111)
Creatinine, Ser: 0.71 mg/dL (ref 0.61–1.24)
GFR calc Af Amer: >60 mL/min (ref >60)
GFR calc non Af Amer: >60 mL/min (ref >60)
Glucose, Bld: 124 mg/dL — ABNORMAL HIGH (ref 65–99)
Potassium: 3.8 mmol/L (ref 3.5–5.1)
Sodium: 141 mmol/L (ref 135–145)
Total Bilirubin: 1 mg/dL (ref 0.3–1.2)
Total Protein: 6.5 g/dL (ref 6.5–8.1)

## 2018-01-29 LAB — ABO/RH: ABO/RH(D): A POS

## 2018-01-29 LAB — PREPARE RBC (CROSSMATCH)

## 2018-02-02 MED ORDER — SODIUM CHLORIDE 0.9 % IV SOLN
1000.0000 mg | INTRAVENOUS | Status: AC
  Administered 2018-02-03: 1000 mg via INTRAVENOUS
  Filled 2018-02-02: qty 10

## 2018-02-02 NOTE — H&P (Signed)
PREOP CONSULT/REFERRAL INTRA-OFFICE FROM DR Gaylene BrooksJW KEELING     Chief Complaint  Patient presents with  . Knee Pain      right       62 year old male presents for evaluation of painful right knee, previously scheduled for right total knee but declined comes back for reevaluation for possible right total knee arthroplasty complains of a dull aching medial pain over the right knee medial joint line for several years.   Status post arthroscopy.  This is associated with difficulty walking bending climbing and squatting.       Review of Systems  Respiratory: Negative.   Cardiovascular: Negative.   All other systems reviewed and are negative.           Past Medical History:  Diagnosis Date  . Arthritis             Past Surgical History:  Procedure Laterality Date  . COLONOSCOPY      . KNEE ARTHROSCOPY Right 01/31/2015    Procedure: RIGHT KNEE ARTHROSCOPY, PARTIAL MEDIAL MENISECTOMY;  Surgeon: Darreld McleanWayne Keeling, MD;  Location: [facility];  Service: Orthopedics;  Laterality: Right;  Marland Kitchen. VASECTOMY          History reviewed. No pertinent family history. Social History         Tobacco Use  . Smoking status: Former Smoker      Packs/day: 1.00      Years: 30.00      Pack years: 30.00      Types: Cigarettes      Last attempt to quit: 01/29/2006      Years since quitting: 11.9  . Smokeless tobacco: Never Used  Substance Use Topics  . Alcohol use: Yes      Comment: occ  . Drug use: No          Allergies  Allergen Reactions  . Poison Ivy Extract [Poison Ivy Extract] Hives and Rash        Active Medications      Current Meds  Medication Sig  . amLODipine (NORVASC) 5 MG tablet    . benazepril-hydrochlorthiazide (LOTENSIN HCT) 20-12.5 MG tablet Take 1 tablet by mouth daily.  Marland Kitchen. HYDROcodone-acetaminophen (NORCO/VICODIN) 5-325 MG tablet Take 1 tablet by mouth every 6 (six) hours as needed for moderate pain (Must last 30 days.  Do not take and drive a car or use machinery.).  Marland Kitchen. Multiple  Vitamin (MULTIVITAMIN WITH MINERALS) TABS tablet Take 1 tablet by mouth daily.  . naproxen (NAPROSYN) 500 MG tablet TAKE ONE TABLET BY MOUTH TWICE DAILY AFTER  A  MEAL  . omeprazole (PRILOSEC) 20 MG capsule    . tamsulosin (FLOMAX) 0.4 MG CAPS capsule Take 0.4 mg by mouth.        BP 118/76   Pulse 79   Ht 5\' 10"  (1.778 m)   Wt 220 lb (99.8 kg)   BMI 31.57 kg/m    Physical Exam  Constitutional: He is oriented to person, place, and time. He appears well-developed and well-nourished.  Vital signs have been reviewed and are stable. Gen. appearance the patient is well-developed and well-nourished with normal grooming and hygiene.   Musculoskeletal:  GAIT IS antalgic  Neurological: He is alert and oriented to person, place, and time.  Skin: Skin is warm and dry. No erythema.  Psychiatric: He has a normal mood and affect.  Vitals reviewed.     Right Knee Exam    Muscle Strength  The patient has normal right knee strength.  Tenderness  The patient is experiencing tenderness in the medial joint line.   Range of Motion  Extension:  5 normal  Flexion:  110 normal    Tests  McMurray:  Medial - negative Lateral - negative Varus: negative Valgus: negative Drawer:  Anterior - negative    Posterior - negative   Other  Erythema: absent Scars: absent Sensation: normal Pulse: present Swelling: none     Left Knee Exam    Muscle Strength  The patient has normal left knee strength.   Tenderness  The patient is experiencing tenderness in the medial joint line.   Range of Motion  Extension:  5 normal  Flexion: normal Left knee flexion: 115.    Tests  McMurray:  Medial - negative Lateral - negative Varus: negative Valgus: negative Drawer:  Anterior - negative     Posterior - negative   Other  Erythema: absent Scars: absent Sensation: normal Pulse: present Swelling: none                   Encounter Diagnoses  Name Primary?  . Chronic pain of right knee Yes   . Chronic pain of left knee    . Primary osteoarthritis of right knee          PLAN:    The procedure has been fully reviewed with the patient; The risks and benefits of surgery have been discussed and explained and understood. Alternative treatment has also been reviewed, questions were encouraged and answered. The postoperative plan is also been reviewed.     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   He will be on opioid holiday for 30 days    Right tka    March 19

## 2018-02-03 ENCOUNTER — Inpatient Hospital Stay (HOSPITAL_COMMUNITY)
Admission: RE | Admit: 2018-02-03 | Discharge: 2018-02-05 | DRG: 470 | Disposition: A | Discharge status: Home Health | Payer: PRIVATE HEALTH INSURANCE | Source: Ambulatory Visit | Attending: Orthopedic Surgery | Admitting: Orthopedic Surgery

## 2018-02-03 ENCOUNTER — Other Ambulatory Visit: Payer: Self-pay

## 2018-02-03 ENCOUNTER — Inpatient Hospital Stay (HOSPITAL_COMMUNITY): Payer: PRIVATE HEALTH INSURANCE | Admitting: Anesthesiology

## 2018-02-03 ENCOUNTER — Encounter (HOSPITAL_COMMUNITY)
Admission: RE | Disposition: A | Discharge status: Home Health | Payer: Self-pay | Source: Ambulatory Visit | Attending: Orthopedic Surgery

## 2018-02-03 ENCOUNTER — Inpatient Hospital Stay (HOSPITAL_COMMUNITY): Payer: PRIVATE HEALTH INSURANCE

## 2018-02-03 ENCOUNTER — Encounter (HOSPITAL_COMMUNITY): Payer: Self-pay | Admitting: *Deleted

## 2018-02-03 DIAGNOSIS — Z87891 Personal history of nicotine dependence: Secondary | ICD-10-CM | POA: Diagnosis not present

## 2018-02-03 DIAGNOSIS — R338 Other retention of urine: Secondary | ICD-10-CM | POA: Diagnosis present

## 2018-02-03 DIAGNOSIS — Z791 Long term (current) use of non-steroidal anti-inflammatories (NSAID): Secondary | ICD-10-CM | POA: Diagnosis not present

## 2018-02-03 DIAGNOSIS — M1711 Unilateral primary osteoarthritis, right knee: Principal | ICD-10-CM | POA: Diagnosis present

## 2018-02-03 DIAGNOSIS — Z91048 Other nonmedicinal substance allergy status: Secondary | ICD-10-CM | POA: Diagnosis not present

## 2018-02-03 DIAGNOSIS — I10 Essential (primary) hypertension: Secondary | ICD-10-CM | POA: Diagnosis present

## 2018-02-03 DIAGNOSIS — Z96651 Presence of right artificial knee joint: Secondary | ICD-10-CM

## 2018-02-03 DIAGNOSIS — N401 Enlarged prostate with lower urinary tract symptoms: Secondary | ICD-10-CM | POA: Diagnosis present

## 2018-02-03 DIAGNOSIS — Z96659 Presence of unspecified artificial knee joint: Secondary | ICD-10-CM

## 2018-02-03 DIAGNOSIS — Z79899 Other long term (current) drug therapy: Secondary | ICD-10-CM | POA: Diagnosis not present

## 2018-02-03 HISTORY — PX: TOTAL KNEE ARTHROPLASTY: SHX125

## 2018-02-03 SURGERY — ARTHROPLASTY, KNEE, TOTAL
Anesthesia: General | Site: Knee | Laterality: Right

## 2018-02-03 MED ORDER — METOCLOPRAMIDE HCL 5 MG/ML IJ SOLN: 5.0000 mg | Freq: Three times a day (TID) | INTRAMUSCULAR | Status: DC | PRN

## 2018-02-03 MED ORDER — CEFAZOLIN SODIUM-DEXTROSE 2-4 GM/100ML-% IV SOLN
2.0000 g | INTRAVENOUS | Status: AC
  Administered 2018-02-03: 2 g via INTRAVENOUS

## 2018-02-03 MED ORDER — DIPHENHYDRAMINE HCL 12.5 MG/5ML PO ELIX: 12.5000 mg | ORAL_SOLUTION | ORAL | Status: DC | PRN

## 2018-02-03 MED ORDER — ROCURONIUM BROMIDE 100 MG/10ML IV SOLN
INTRAVENOUS | Status: DC | PRN
  Administered 2018-02-03: 50 mg via INTRAVENOUS

## 2018-02-03 MED ORDER — CELECOXIB 100 MG PO CAPS
200.0000 mg | ORAL_CAPSULE | Freq: Two times a day (BID) | ORAL | Status: DC
  Administered 2018-02-03 – 2018-02-05 (×5): 200 mg via ORAL
  Filled 2018-02-03 (×5): qty 2

## 2018-02-03 MED ORDER — SODIUM CHLORIDE 0.9 % IR SOLN
Status: DC | PRN
  Administered 2018-02-03: 3000 mL

## 2018-02-03 MED ORDER — SUGAMMADEX SODIUM 200 MG/2ML IV SOLN
INTRAVENOUS | Status: AC
  Filled 2018-02-03: qty 2

## 2018-02-03 MED ORDER — ASPIRIN 81 MG PO CHEW
81.0000 mg | CHEWABLE_TABLET | Freq: Two times a day (BID) | ORAL | Status: DC
  Administered 2018-02-03 – 2018-02-05 (×4): 81 mg via ORAL
  Filled 2018-02-03 (×4): qty 1

## 2018-02-03 MED ORDER — LACTATED RINGERS IV SOLN
INTRAVENOUS | Status: DC
  Administered 2018-02-03: 08:00:00 via INTRAVENOUS
  Administered 2018-02-03: 1000 mL via INTRAVENOUS

## 2018-02-03 MED ORDER — VITAMIN C 500 MG PO TABS
500.0000 mg | ORAL_TABLET | Freq: Every day | ORAL | Status: DC
  Administered 2018-02-03 – 2018-02-05 (×3): 500 mg via ORAL
  Filled 2018-02-03 (×3): qty 1

## 2018-02-03 MED ORDER — ADULT MULTIVITAMIN W/MINERALS CH
1.0000 | ORAL_TABLET | Freq: Every day | ORAL | Status: DC
  Administered 2018-02-03 – 2018-02-05 (×3): 1 via ORAL
  Filled 2018-02-03 (×3): qty 1

## 2018-02-03 MED ORDER — BUPIVACAINE-EPINEPHRINE (PF) 0.5% -1:200000 IJ SOLN
INTRAMUSCULAR | Status: DC | PRN
  Administered 2018-02-03: 20 mL via PERINEURAL

## 2018-02-03 MED ORDER — ONDANSETRON HCL 4 MG/2ML IJ SOLN
4.0000 mg | Freq: Once | INTRAMUSCULAR | Status: AC
  Administered 2018-02-03: 4 mg via INTRAVENOUS

## 2018-02-03 MED ORDER — GABAPENTIN 300 MG PO CAPS
300.0000 mg | ORAL_CAPSULE | Freq: Three times a day (TID) | ORAL | Status: DC
  Administered 2018-02-03 – 2018-02-05 (×7): 300 mg via ORAL
  Filled 2018-02-03 (×7): qty 1

## 2018-02-03 MED ORDER — TAMSULOSIN HCL 0.4 MG PO CAPS
0.4000 mg | ORAL_CAPSULE | Freq: Every day | ORAL | Status: DC
  Administered 2018-02-03 – 2018-02-04 (×2): 0.4 mg via ORAL
  Filled 2018-02-03 (×2): qty 1

## 2018-02-03 MED ORDER — BUPIVACAINE-EPINEPHRINE (PF) 0.5% -1:200000 IJ SOLN
INTRAMUSCULAR | Status: AC
  Filled 2018-02-03: qty 30

## 2018-02-03 MED ORDER — ONDANSETRON HCL 4 MG/2ML IJ SOLN
INTRAMUSCULAR | Status: AC
  Filled 2018-02-03: qty 2

## 2018-02-03 MED ORDER — TRAMADOL HCL 50 MG PO TABS
50.0000 mg | ORAL_TABLET | Freq: Four times a day (QID) | ORAL | Status: DC
  Administered 2018-02-03 – 2018-02-05 (×9): 50 mg via ORAL
  Filled 2018-02-03 (×9): qty 1

## 2018-02-03 MED ORDER — ACETAMINOPHEN 325 MG PO TABS: 325.0000 mg | ORAL_TABLET | Freq: Four times a day (QID) | ORAL | Status: DC | PRN

## 2018-02-03 MED ORDER — HYDROMORPHONE HCL 1 MG/ML IJ SOLN
0.5000 mg | INTRAMUSCULAR | Status: DC | PRN
  Administered 2018-02-04 (×3): 1 mg via INTRAVENOUS
  Administered 2018-02-05: 0.5 mg via INTRAVENOUS
  Filled 2018-02-03 (×4): qty 1

## 2018-02-03 MED ORDER — SENNOSIDES-DOCUSATE SODIUM 8.6-50 MG PO TABS: 1.0000 | ORAL_TABLET | Freq: Every evening | ORAL | Status: DC | PRN

## 2018-02-03 MED ORDER — OXYCODONE HCL 5 MG PO TABS
5.0000 mg | ORAL_TABLET | Freq: Once | ORAL | Status: AC
  Administered 2018-02-03: 5 mg via ORAL

## 2018-02-03 MED ORDER — BENAZEPRIL HCL 10 MG PO TABS
20.0000 mg | ORAL_TABLET | Freq: Every day | ORAL | Status: DC
  Administered 2018-02-03 – 2018-02-05 (×3): 20 mg via ORAL
  Filled 2018-02-03 (×3): qty 2

## 2018-02-03 MED ORDER — FENTANYL CITRATE (PF) 100 MCG/2ML IJ SOLN
INTRAMUSCULAR | Status: DC | PRN
  Administered 2018-02-03 (×2): 25 ug via INTRAVENOUS
  Administered 2018-02-03: 100 ug via INTRAVENOUS
  Administered 2018-02-03: 50 ug via INTRAVENOUS
  Administered 2018-02-03 (×2): 25 ug via INTRAVENOUS
  Administered 2018-02-03: 100 ug via INTRAVENOUS
  Administered 2018-02-03: 50 ug via INTRAVENOUS
  Administered 2018-02-03 (×2): 25 ug via INTRAVENOUS

## 2018-02-03 MED ORDER — MIDAZOLAM HCL 2 MG/2ML IJ SOLN
INTRAMUSCULAR | Status: AC
  Filled 2018-02-03: qty 2

## 2018-02-03 MED ORDER — CEFAZOLIN SODIUM-DEXTROSE 2-4 GM/100ML-% IV SOLN
2.0000 g | Freq: Four times a day (QID) | INTRAVENOUS | Status: AC
  Administered 2018-02-03 (×2): 2 g via INTRAVENOUS
  Filled 2018-02-03 (×2): qty 100

## 2018-02-03 MED ORDER — CHLORHEXIDINE GLUCONATE 4 % EX LIQD: 60.0000 mL | Freq: Once | CUTANEOUS | Status: DC

## 2018-02-03 MED ORDER — SODIUM CHLORIDE 0.9 % IJ SOLN
INTRAMUSCULAR | Status: AC
  Filled 2018-02-03: qty 40

## 2018-02-03 MED ORDER — MENTHOL 3 MG MT LOZG: 1.0000 | LOZENGE | OROMUCOSAL | Status: DC | PRN

## 2018-02-03 MED ORDER — SODIUM CHLORIDE 0.9 % IV SOLN
INTRAVENOUS | Status: DC | PRN
  Administered 2018-02-03: 60 mL

## 2018-02-03 MED ORDER — AMLODIPINE BESYLATE 5 MG PO TABS
5.0000 mg | ORAL_TABLET | Freq: Every day | ORAL | Status: DC
  Administered 2018-02-04 – 2018-02-05 (×2): 5 mg via ORAL
  Filled 2018-02-03 (×3): qty 1

## 2018-02-03 MED ORDER — FENTANYL CITRATE (PF) 100 MCG/2ML IJ SOLN
INTRAMUSCULAR | Status: AC
  Filled 2018-02-03: qty 2

## 2018-02-03 MED ORDER — ACETAMINOPHEN 500 MG PO TABS
1000.0000 mg | ORAL_TABLET | Freq: Four times a day (QID) | ORAL | Status: AC
  Administered 2018-02-03 – 2018-02-04 (×4): 1000 mg via ORAL
  Filled 2018-02-03 (×4): qty 2

## 2018-02-03 MED ORDER — ROCURONIUM BROMIDE 50 MG/5ML IV SOLN
INTRAVENOUS | Status: AC
  Filled 2018-02-03: qty 1

## 2018-02-03 MED ORDER — HYDROMORPHONE HCL 1 MG/ML IJ SOLN
0.2500 mg | INTRAMUSCULAR | Status: DC | PRN
  Administered 2018-02-03 (×3): 0.5 mg via INTRAVENOUS
  Filled 2018-02-03 (×6): qty 0.5

## 2018-02-03 MED ORDER — PHENOL 1.4 % MT LIQD: 1.0000 | OROMUCOSAL | Status: DC | PRN

## 2018-02-03 MED ORDER — OXYCODONE HCL 5 MG PO TABS
ORAL_TABLET | ORAL | Status: AC
  Filled 2018-02-03: qty 1

## 2018-02-03 MED ORDER — PREGABALIN 50 MG PO CAPS
50.0000 mg | ORAL_CAPSULE | Freq: Once | ORAL | Status: AC
  Administered 2018-02-03: 50 mg via ORAL

## 2018-02-03 MED ORDER — OXYCODONE HCL 5 MG PO TABS
10.0000 mg | ORAL_TABLET | ORAL | Status: DC | PRN
  Filled 2018-02-03: qty 2

## 2018-02-03 MED ORDER — METHOCARBAMOL 500 MG PO TABS: 500.0000 mg | ORAL_TABLET | Freq: Four times a day (QID) | ORAL | Status: DC | PRN

## 2018-02-03 MED ORDER — ONDANSETRON HCL 4 MG PO TABS: 4.0000 mg | ORAL_TABLET | Freq: Four times a day (QID) | ORAL | Status: DC | PRN

## 2018-02-03 MED ORDER — DEXAMETHASONE SODIUM PHOSPHATE 10 MG/ML IJ SOLN
10.0000 mg | Freq: Once | INTRAMUSCULAR | Status: AC
  Administered 2018-02-04: 10 mg via INTRAVENOUS
  Filled 2018-02-03: qty 1

## 2018-02-03 MED ORDER — METHOCARBAMOL 1000 MG/10ML IJ SOLN
500.0000 mg | Freq: Once | INTRAVENOUS | Status: AC
  Administered 2018-02-03: 500 mg via INTRAVENOUS
  Filled 2018-02-03: qty 5

## 2018-02-03 MED ORDER — MIDAZOLAM HCL 2 MG/2ML IJ SOLN
INTRAMUSCULAR | Status: DC | PRN
  Administered 2018-02-03: 1 mg via INTRAVENOUS

## 2018-02-03 MED ORDER — MIDAZOLAM HCL 2 MG/2ML IJ SOLN
1.0000 mg | INTRAMUSCULAR | Status: DC
  Administered 2018-02-03: 2 mg via INTRAVENOUS

## 2018-02-03 MED ORDER — 0.9 % SODIUM CHLORIDE (POUR BTL) OPTIME
TOPICAL | Status: DC | PRN
  Administered 2018-02-03: 1000 mL

## 2018-02-03 MED ORDER — METOCLOPRAMIDE HCL 10 MG PO TABS: 5.0000 mg | ORAL_TABLET | Freq: Three times a day (TID) | ORAL | Status: DC | PRN

## 2018-02-03 MED ORDER — PROPOFOL 10 MG/ML IV BOLUS
INTRAVENOUS | Status: AC
  Filled 2018-02-03: qty 20

## 2018-02-03 MED ORDER — CELECOXIB 400 MG PO CAPS
ORAL_CAPSULE | ORAL | Status: AC
  Filled 2018-02-03: qty 1

## 2018-02-03 MED ORDER — DEXAMETHASONE SODIUM PHOSPHATE 4 MG/ML IJ SOLN
INTRAMUSCULAR | Status: AC
  Filled 2018-02-03: qty 1

## 2018-02-03 MED ORDER — HYDROMORPHONE HCL 1 MG/ML IJ SOLN
0.5000 mg | INTRAMUSCULAR | Status: DC | PRN
  Administered 2018-02-03: 0.5 mg via INTRAVENOUS
  Filled 2018-02-03: qty 0.5

## 2018-02-03 MED ORDER — FLEET ENEMA 7-19 GM/118ML RE ENEM: 1.0000 | ENEMA | Freq: Once | RECTAL | Status: DC | PRN

## 2018-02-03 MED ORDER — KETOROLAC TROMETHAMINE 15 MG/ML IJ SOLN
7.5000 mg | Freq: Four times a day (QID) | INTRAMUSCULAR | Status: AC
  Administered 2018-02-03 – 2018-02-04 (×4): 7.5 mg via INTRAVENOUS
  Filled 2018-02-03 (×4): qty 1

## 2018-02-03 MED ORDER — OXYCODONE HCL 5 MG PO TABS
5.0000 mg | ORAL_TABLET | ORAL | Status: DC | PRN
  Administered 2018-02-04: 10 mg via ORAL
  Administered 2018-02-05: 5 mg via ORAL
  Filled 2018-02-03: qty 1

## 2018-02-03 MED ORDER — CELECOXIB 400 MG PO CAPS
400.0000 mg | ORAL_CAPSULE | Freq: Once | ORAL | Status: AC
  Administered 2018-02-03: 400 mg via ORAL

## 2018-02-03 MED ORDER — TRANEXAMIC ACID 1000 MG/10ML IV SOLN
1000.0000 mg | Freq: Once | INTRAVENOUS | Status: AC
  Administered 2018-02-03: 1000 mg via INTRAVENOUS
  Filled 2018-02-03: qty 10

## 2018-02-03 MED ORDER — CEFAZOLIN SODIUM-DEXTROSE 2-4 GM/100ML-% IV SOLN
INTRAVENOUS | Status: AC
  Filled 2018-02-03: qty 100

## 2018-02-03 MED ORDER — BISACODYL 5 MG PO TBEC: 5.0000 mg | DELAYED_RELEASE_TABLET | Freq: Every day | ORAL | Status: DC | PRN

## 2018-02-03 MED ORDER — BUPIVACAINE LIPOSOME 1.3 % IJ SUSP
INTRAMUSCULAR | Status: AC
  Filled 2018-02-03: qty 20

## 2018-02-03 MED ORDER — SUGAMMADEX SODIUM 500 MG/5ML IV SOLN
INTRAVENOUS | Status: DC | PRN
  Administered 2018-02-03: 200 mg via INTRAVENOUS

## 2018-02-03 MED ORDER — OXYCODONE HCL ER 10 MG PO T12A
10.0000 mg | EXTENDED_RELEASE_TABLET | Freq: Two times a day (BID) | ORAL | Status: DC
  Administered 2018-02-03 – 2018-02-05 (×5): 10 mg via ORAL
  Filled 2018-02-03 (×5): qty 1

## 2018-02-03 MED ORDER — PANTOPRAZOLE SODIUM 40 MG PO TBEC
40.0000 mg | DELAYED_RELEASE_TABLET | Freq: Every day | ORAL | Status: DC
  Administered 2018-02-03 – 2018-02-05 (×3): 40 mg via ORAL
  Filled 2018-02-03 (×3): qty 1

## 2018-02-03 MED ORDER — DEXAMETHASONE SODIUM PHOSPHATE 4 MG/ML IJ SOLN
4.0000 mg | Freq: Once | INTRAMUSCULAR | Status: AC
  Administered 2018-02-03: 4 mg via INTRAVENOUS

## 2018-02-03 MED ORDER — ARGININE 500 MG PO CAPS: ORAL_CAPSULE | Freq: Every day | ORAL | Status: DC

## 2018-02-03 MED ORDER — FENTANYL CITRATE (PF) 250 MCG/5ML IJ SOLN
INTRAMUSCULAR | Status: AC
  Filled 2018-02-03: qty 5

## 2018-02-03 MED ORDER — PROPOFOL 10 MG/ML IV BOLUS
INTRAVENOUS | Status: DC | PRN
  Administered 2018-02-03: 200 mg via INTRAVENOUS

## 2018-02-03 MED ORDER — DOCUSATE SODIUM 100 MG PO CAPS
100.0000 mg | ORAL_CAPSULE | Freq: Two times a day (BID) | ORAL | Status: DC
  Administered 2018-02-03 – 2018-02-05 (×5): 100 mg via ORAL
  Filled 2018-02-03 (×5): qty 1

## 2018-02-03 MED ORDER — PREGABALIN 50 MG PO CAPS
ORAL_CAPSULE | ORAL | Status: AC
  Filled 2018-02-03: qty 1

## 2018-02-03 MED ORDER — ONDANSETRON HCL 4 MG/2ML IJ SOLN: 4.0000 mg | Freq: Four times a day (QID) | INTRAMUSCULAR | Status: DC | PRN

## 2018-02-03 MED ORDER — HYDROMORPHONE HCL 1 MG/ML IJ SOLN
0.2500 mg | INTRAMUSCULAR | Status: DC | PRN
  Administered 2018-02-03 (×2): 0.5 mg via INTRAVENOUS

## 2018-02-03 MED ORDER — METHOCARBAMOL 1000 MG/10ML IJ SOLN
500.0000 mg | Freq: Four times a day (QID) | INTRAMUSCULAR | Status: DC | PRN
  Filled 2018-02-03: qty 5

## 2018-02-03 SURGICAL SUPPLY — 74 items
BAG HAMPER (MISCELLANEOUS) ×3 IMPLANT
BANDAGE ELASTIC 4 LF NS (GAUZE/BANDAGES/DRESSINGS) ×6 IMPLANT
BANDAGE ELASTIC 6 LF NS (GAUZE/BANDAGES/DRESSINGS) ×3 IMPLANT
BANDAGE ESMARK 6X9 LF (GAUZE/BANDAGES/DRESSINGS) ×1 IMPLANT
BIT DRILL 3.2X128 (BIT) IMPLANT
BIT DRILL 3.2X128MM (BIT)
BLADE HEX COATED 2.75 (ELECTRODE) ×3 IMPLANT
BLADE SAGITTAL 25.0X1.27X90 (BLADE) ×2 IMPLANT
BLADE SAGITTAL 25.0X1.27X90MM (BLADE) ×1
BNDG CMPR 9X6 STRL LF SNTH (GAUZE/BANDAGES/DRESSINGS) ×1
BNDG CMPR MED 5X4 ELC HKLP NS (GAUZE/BANDAGES/DRESSINGS) ×2
BNDG CMPR MED 5X6 ELC HKLP NS (GAUZE/BANDAGES/DRESSINGS) ×1
BNDG ESMARK 6X9 LF (GAUZE/BANDAGES/DRESSINGS) ×3
CAP KNEE TOTAL 3 SIGMA ×2 IMPLANT
CEMENT HV SMART SET (Cement) ×6 IMPLANT
CLOTH BEACON ORANGE TIMEOUT ST (SAFETY) ×3 IMPLANT
COOLER CRYO CUFF IC AND MOTOR (MISCELLANEOUS) ×3 IMPLANT
COVER LIGHT HANDLE STERIS (MISCELLANEOUS) ×6 IMPLANT
CUFF CRYO KNEE18X23 MED (MISCELLANEOUS) ×2 IMPLANT
CUFF TOURNIQUET SINGLE 34IN LL (TOURNIQUET CUFF) ×2 IMPLANT
DECANTER SPIKE VIAL GLASS SM (MISCELLANEOUS) ×4 IMPLANT
DRAPE BACK TABLE (DRAPES) ×3 IMPLANT
DRAPE EXTREMITY T 121X128X90 (DRAPE) ×3 IMPLANT
DRESSING AQUACEL AG ADV 3.5X12 (MISCELLANEOUS) ×1 IMPLANT
DRSG AQUACEL AG ADV 3.5X12 (MISCELLANEOUS) ×3
DURAPREP 26ML APPLICATOR (WOUND CARE) ×6 IMPLANT
ELECT REM PT RETURN 9FT ADLT (ELECTROSURGICAL) ×3
ELECTRODE REM PT RTRN 9FT ADLT (ELECTROSURGICAL) ×1 IMPLANT
GLOVE BIO SURGEON STRL SZ7 (GLOVE) ×6 IMPLANT
GLOVE BIOGEL PI IND STRL 6.5 (GLOVE) IMPLANT
GLOVE BIOGEL PI IND STRL 7.0 (GLOVE) ×2 IMPLANT
GLOVE BIOGEL PI INDICATOR 6.5 (GLOVE) ×2
GLOVE BIOGEL PI INDICATOR 7.0 (GLOVE) ×10
GLOVE ECLIPSE 7.0 STRL STRAW (GLOVE) ×2 IMPLANT
GLOVE SKINSENSE NS SZ8.0 LF (GLOVE) ×4
GLOVE SKINSENSE STRL SZ8.0 LF (GLOVE) ×2 IMPLANT
GLOVE SS N UNI LF 8.5 STRL (GLOVE) ×3 IMPLANT
GLOVE SURG SS PI 6.5 STRL IVOR (GLOVE) ×2 IMPLANT
GOWN STRL REUS W/ TWL LRG LVL3 (GOWN DISPOSABLE) ×1 IMPLANT
GOWN STRL REUS W/TWL LRG LVL3 (GOWN DISPOSABLE) ×13 IMPLANT
GOWN STRL REUS W/TWL XL LVL3 (GOWN DISPOSABLE) ×3 IMPLANT
HANDPIECE INTERPULSE COAX TIP (DISPOSABLE) ×3
HOOD W/PEELAWAY (MISCELLANEOUS) ×14 IMPLANT
INST SET MAJOR BONE (KITS) ×3 IMPLANT
IV NS IRRIG 3000ML ARTHROMATIC (IV SOLUTION) ×3 IMPLANT
KIT BLADEGUARD II DBL (SET/KITS/TRAYS/PACK) ×3 IMPLANT
KIT ROOM TURNOVER AP CYSTO (KITS) ×3 IMPLANT
MANIFOLD NEPTUNE II (INSTRUMENTS) ×3 IMPLANT
MARKER SKIN DUAL TIP RULER LAB (MISCELLANEOUS) ×3 IMPLANT
NDL HYPO 21X1.5 SAFETY (NEEDLE) ×1 IMPLANT
NEEDLE HYPO 21X1.5 SAFETY (NEEDLE) ×3 IMPLANT
NS IRRIG 1000ML POUR BTL (IV SOLUTION) ×3 IMPLANT
PACK TOTAL JOINT (CUSTOM PROCEDURE TRAY) ×3 IMPLANT
PAD ARMBOARD 7.5X6 YLW CONV (MISCELLANEOUS) ×3 IMPLANT
PAD DANNIFLEX CPM (ORTHOPEDIC SUPPLIES) ×3 IMPLANT
PILLOW KNEE EXTENSION 0 DEG (MISCELLANEOUS) ×3 IMPLANT
PIN TROCAR 3 INCH (PIN) IMPLANT
SAW OSC TIP CART 19.5X105X1.3 (SAW) ×3 IMPLANT
SET BASIN LINEN APH (SET/KITS/TRAYS/PACK) ×3 IMPLANT
SET HNDPC FAN SPRY TIP SCT (DISPOSABLE) ×1 IMPLANT
STAPLER VISISTAT 35W (STAPLE) ×3 IMPLANT
SUT BRALON NAB BRD #1 30IN (SUTURE) ×6 IMPLANT
SUT MNCRL 0 VIOLET CTX 36 (SUTURE) ×1 IMPLANT
SUT MON AB 0 CT1 (SUTURE) ×3 IMPLANT
SUT MON AB 2-0 CT1 36 (SUTURE) IMPLANT
SUT MONOCRYL 0 CTX 36 (SUTURE) ×2
SYR 20CC LL (SYRINGE) ×7 IMPLANT
SYR 30ML LL (SYRINGE) ×4 IMPLANT
SYR BULB IRRIGATION 50ML (SYRINGE) ×3 IMPLANT
TOWEL OR 17X26 4PK STRL BLUE (TOWEL DISPOSABLE) ×3 IMPLANT
TOWER CARTRIDGE SMART MIX (DISPOSABLE) ×3 IMPLANT
TRAY FOLEY W/METER SILVER 16FR (SET/KITS/TRAYS/PACK) ×3 IMPLANT
WATER STERILE IRR 1000ML POUR (IV SOLUTION) ×6 IMPLANT
YANKAUER SUCT 12FT TUBE ARGYLE (SUCTIONS) ×3 IMPLANT

## 2018-02-03 NOTE — Plan of Care (Signed)
  Acute Rehab PT Goals(only PT should resolve) Pt Will Go Supine/Side To Sit 02/03/2018 1607 - Progressing by Ocie BobWatkins, Mikyah Alamo, PT Flowsheets Taken 02/03/2018 1607  Pt will go Supine/Side to Sit Independently Patient Will Transfer Sit To/From Stand 02/03/2018 1607 - Progressing by Ocie BobWatkins, Favor Kreh, PT Flowsheets Taken 02/03/2018 1607  Patient will transfer sit to/from stand with modified independence Pt Will Transfer Bed To Chair/Chair To Bed 02/03/2018 1607 - Progressing by Ocie BobWatkins, Keegan Ducey, PT Flowsheets Taken 02/03/2018 1607  Pt will Transfer Bed to Chair/Chair to Bed with modified independence Pt Will Ambulate 02/03/2018 1607 - Progressing by Ocie BobWatkins, Cayde Held, PT Flowsheets Taken 02/03/2018 1607  Pt will Ambulate > 125 feet;with modified independence;with rolling walker  4:08 PM, 02/03/18 Ocie BobJames Faiz Weber, MPT Physical Therapist with [facility] 336 [phone] office [phone] mobile phone

## 2018-02-03 NOTE — Evaluation (Signed)
Physical Therapy Evaluation Patient Details Name: Melvin CarinaGary F Doggett MRN: [id number] DOB: May 10, 1956 Today's Date: 02/03/2018  RIGHT KNEE ROM: 10-95 degrees AMBULATION DISTANCE: 55 feet using RW with Supervision    History of Present Illness  Melvin Turner is a 62 y/o male, s/p Right TKA 02/03/18 with the diagnosis of PRIMARY OSTEOARTHRITIS KNEE    Clinical Impression  Patient instructed in HEP with written instructions provided and good return demonstrated.  Patient able to ambulate into hallway with fair return for right heel to toe stepping, no loss of balance, limited mostly due to fatigue.  Patient unable to fully extend right knee due to stiff end feel/increased pain.  Patient will benefit from continued physical therapy in hospital and recommended venue below to increase strength, balance, endurance for safe ADLs and gait.    Follow Up Recommendations Home health PT;Supervision for mobility/OOB    Equipment Recommendations  Rolling walker with 5" wheels    Recommendations for Other Services       Precautions / Restrictions Precautions Precautions: Fall Precaution Comments: s/p Right TKA Restrictions Weight Bearing Restrictions: Yes RLE Weight Bearing: Weight bearing as tolerated Other Position/Activity Restrictions: no pillows underneath right knee      Mobility  Bed Mobility Overal bed mobility: Modified Independent                Transfers Overall transfer level: Needs assistance Equipment used: Rolling walker (2 wheeled) Transfers: Sit to/from [employer] CorporationStand;Stand Pivot Transfers Sit to Stand: Supervision Stand pivot transfers: Supervision       General transfer comment: slightly labored movement  Ambulation/Gait Ambulation/Gait assistance: Supervision Ambulation Distance (Feet): 55 Feet Assistive device: Rolling walker (2 wheeled) Gait Pattern/deviations: Decreased step length - right;Decreased stance time - right;Decreased stride length   Gait velocity  interpretation: Below normal speed for age/gender General Gait Details: slightly labored cadence with fair return for right heel to toe stepping without loss of balance  Stairs            Wheelchair Mobility    Modified Rankin (Stroke Patients Only)       Balance Overall balance assessment: Needs assistance Sitting-balance support: Feet supported;No upper extremity supported Sitting balance-Leahy Scale: Good     Standing balance support: Bilateral upper extremity supported;During functional activity Standing balance-Leahy Scale: Fair                               Pertinent Vitals/Pain Pain Assessment: 0-10 Pain Score: 5  Pain Location: right knee Pain Descriptors / Indicators: Sore;Discomfort Pain Intervention(s): Limited activity within patient's tolerance;Monitored during session    Home Living Family/patient expects to be discharged to:: Private residence Living Arrangements: Spouse/significant other Available Help at Discharge: Family Type of Home: House Home Access: Stairs to enter Entrance Stairs-Rails: None Secretary/administratorntrance Stairs-Number of Steps: 1 Home Layout: One level Home Equipment: Cane - single point      Prior Function Level of Independence: Independent         Comments: community ambulator, drives, works     Higher education careers adviserHand Dominance        Extremity/Trunk Assessment   Upper Extremity Assessment Upper Extremity Assessment: Overall WFL for tasks assessed    Lower Extremity Assessment Lower Extremity Assessment: RLE deficits/detail;LLE deficits/detail RLE Deficits / Details: grossly -4/5 LLE Deficits / Details: 5/5    Cervical / Trunk Assessment Cervical / Trunk Assessment: Normal  Communication   Communication: No difficulties  Cognition Arousal/Alertness: Awake/alert Behavior During Therapy:  WFL for tasks assessed/performed Overall Cognitive Status: Within Functional Limits for tasks assessed                                         General Comments      Exercises Total Joint Exercises Ankle Circles/Pumps: Supine;AROM;Strengthening;Right;10 reps Quad Sets: Supine;AROM;Strengthening;Right;5 reps Gluteal Sets: Supine;AROM;Strengthening;Both;5 reps Short Arc Quad: Supine;AROM;Strengthening;Right;5 reps Heel Slides: Supine;AROM;Strengthening;Right;5 reps Goniometric ROM: RIGHT KNEE: 10-95 DEGREES   Assessment/Plan    PT Assessment Patient needs continued PT services  PT Problem List Decreased range of motion;Decreased strength;Decreased activity tolerance;Decreased balance;Decreased mobility       PT Treatment Interventions Gait training;Stair training;Functional mobility training;Therapeutic activities;Therapeutic exercise;Patient/family education    PT Goals (Current goals can be found in the Care Plan section)  Acute Rehab PT Goals Patient Stated Goal: return home with spouse to assist PT Goal Formulation: With patient/family Time For Goal Achievement: 02/08/18 Potential to Achieve Goals: Good    Frequency 7X/week   Barriers to discharge        Co-evaluation               AM-PAC PT "6 Clicks" Daily Activity  Outcome Measure Difficulty turning over in bed (including adjusting bedclothes, sheets and blankets)?: None Difficulty moving from lying on back to sitting on the side of the bed? : None Difficulty sitting down on and standing up from a chair with arms (e.g., wheelchair, bedside commode, etc,.)?: A Little Help needed moving to and from a bed to chair (including a wheelchair)?: A Little Help needed walking in hospital room?: A Little Help needed climbing 3-5 steps with a railing? : A Lot 6 Click Score: 19    End of Session   Activity Tolerance: Patient tolerated treatment well;Patient limited by fatigue Patient left: in bed;with call bell/phone within reach;with family/visitor present Nurse Communication: Mobility status;Other (comment)(RN informed that patient safe  to walk with RW in room) PT Visit Diagnosis: Unsteadiness on feet (R26.81);Other abnormalities of gait and mobility (R26.89);Muscle weakness (generalized) (M62.81)    Time: 1610-9604 PT Time Calculation (min) (ACUTE ONLY): 31 min   Charges:   PT Evaluation $PT Eval Moderate Complexity: 1 Mod PT Treatments $Gait Training: 8-22 mins $Therapeutic Exercise: 8-22 mins   PT G Codes:        4:06 PM, February 27, 2018 Ocie Bob, MPT Physical Therapist with [facility] [phone] office [phone] mobile phone

## 2018-02-03 NOTE — Anesthesia Preprocedure Evaluation (Signed)
Anesthesia Evaluation  Patient identified by MRN, date of birth, ID band Patient awake    Reviewed: Allergy & Precautions, NPO status , Patient's Chart, lab work & pertinent test results  Airway Mallampati: II  TM Distance: >3 FB     Dental  (+) Teeth Intact, Dental Advisory Given   Pulmonary former smoker,    breath sounds clear to auscultation       Cardiovascular hypertension, Pt. on medications negative cardio ROS   Rhythm:Regular Rate:Normal     Neuro/Psych    GI/Hepatic negative GI ROS, GERD  Medicated and Controlled,  Endo/Other    Renal/GU      Musculoskeletal  (+) Arthritis ,   Abdominal   Peds  Hematology   Anesthesia Other Findings BPH with retention issues  Reproductive/Obstetrics                             Anesthesia Physical Anesthesia Plan  ASA: II  Anesthesia Plan: General   Post-op Pain Management:    Induction: Intravenous  PONV Risk Score and Plan:   Airway Management Planned: Oral ETT  Additional Equipment:   Intra-op Plan:   Post-operative Plan: Extubation in OR  Informed Consent: I have reviewed the patients History and Physical, chart, labs and discussed the procedure including the risks, benefits and alternatives for the proposed anesthesia with the patient or authorized representative who has indicated his/her understanding and acceptance.     Plan Discussed with:   Anesthesia Plan Comments:         Anesthesia Quick Evaluation

## 2018-02-03 NOTE — Care Management (Signed)
Patient Information   Patient Name Melvin Turner, Arther F ([id number]) Sex Male DOB 1955/12/28  Room Bed  A337 A337-01  Patient Demographics   Address [address] [place] [address] Phone [phone] (Home) [phone] (Mobile) E-mail Address [email or web address]  Patient Ethnicity & Race   Ethnic Group Patient Race  Not Hispanic or Latino White or Caucasian  Emergency Contact(s)   Name Relation Home Work [place]  Hamm,Tammy Spouse [phone]  [phone]  Orr,Autumn Daughter [phone]  [phone]  Documents on File    Status Date Received Description  Documents for the Patient  [place] HIPAA NOTICE OF PRIVACY - Scanned Not Received    [facility] E-Signature HIPAA Notice of Privacy Received 09/18/12   Driver's License Received 09/18/12   Insurance Card Not Received    Advance Directives/Living Will/HCPOA/POA Not Received    Driver's License Received 01/29/18 06/2020  Other Photo ID Not Received    AMB Correspondence  01/04/16 RX [place] [employer]  Insurance Card Received 01/23/16 ROSM/BCBS  Release of Information Received 01/23/16 DPR FOR PT'S WIFE/ROSM  Insurance Card Received 02/06/17 PHCS [employer]/ROSM  Insurance Card Received 12/24/17 ROSM/PHCS  AMB Correspondence Received 01/13/18 APPROVAL [employer] HEALTHWATCH  Documents for the Encounter  AOB (Assignment of Insurance Benefits) Not Received    E-signature AOB Signed 02/03/18   MEDICARE RIGHTS Not Received    E-signature Medicare Rights     Admission Information   Attending Provider Admitting Provider Admission Type Admission Date/Time  Vickki HearingHarrison, Stanley E, MD Vickki HearingHarrison, Stanley E, MD Elective 02/03/18 0601  Discharge Date Hospital Service Auth/Cert Status Service Area   Surgery Incomplete [place] SERVICE AREA  Unit Room/Bed Admission Status   AP-DEPT 300 A337/A337-01 Admission (Confirmed)   Admission   Complaint  PRIMARY OSTEOARTHRITIS KNEE  Hospital Account   Name Acct  ID Class Status Primary Coverage  Melvin Turner, [name] [id number] Inpatient Open PHCS MULTIPLAN - MULTIPLAN PHCS      Guarantor Account (for Hospital Account [id number])   Name Relation to Pt Service Area Active? Acct Type  Melvin CarinaEanes, Kayston F Self CHSA Yes Personal/Family  Address Phone    [address] [place], [place] [address] [phone](H)        Coverage Information (for Hospital Account [id number])   F/O Payor/Plan Precert #  [facility]   Subscriber Subscriber #  Melvin Turner, Versie F [id number]  Address Phone  [phone] [place]

## 2018-02-03 NOTE — Interval H&P Note (Signed)
History and Physical Interval Note:  02/03/2018 7:17 AM  Melvin CarinaGary F Borger  has presented today for surgery, with the diagnosis of PRIMARY OSTEOARTHRITIS KNEE  The various methods of treatment have been discussed with the patient and family. After consideration of risks, benefits and other options for treatment, the patient has consented to  Procedure(s): TOTAL KNEE ARTHROPLASTY (Right) as a surgical intervention .  The patient's history has been reviewed, patient examined, no change in status, stable for surgery.  I have reviewed the patient's chart and labs.  Questions were answered to the patient's satisfaction.     Fuller CanadaStanley Hillary Struss

## 2018-02-03 NOTE — Care Management Note (Signed)
Case Management Note  Patient Details  Name: Melvin Turner MRN: [id number] Date of Birth: 03-Jul-1956                 S/P TKR. Pt from home, has strong family support. Plan for DC Thursday. Pt's CPM order [facility]ed by surgeon office [facility], CM has faxed note for insurance auth. Pt [facility] have HH PT[facility]). Olegario MessierKathy, [facility], aware of referral and will pull info from chart, DME will be delivered to room prior to DC.  CM will follow and provide update to HHA on DC.    Expected Discharge Date:      02/05/18            Expected Discharge Plan:  Home w Home Health Services  In-House Referral:  NA  Discharge planning Services  CM Consult  Post Acute Care Choice:  Durable Medical Equipment, Home Health Choice offered to:  Patient  DME Arranged:  Continuous passive motion machine, Walker rolling, 3-N-1 DME Agency:  [facility]  HH Arr[facility]  [facility]  Status of Service:  In process, will continue to follow  Malcolm MetroChildress, Aveyah Greenwood Demske, RN 02/03/2018, 2:34 PM

## 2018-02-03 NOTE — Anesthesia Postprocedure Evaluation (Signed)
Anesthesia Post Note  Patient: Melvin Turner  Procedure(s) Performed: TOTAL KNEE ARTHROPLASTY (Right Knee)  Patient location during evaluation: PACU Anesthesia Type: General Level of consciousness: awake and patient cooperative Pain management: satisfactory to patient Vital Signs Assessment: post-procedure vital signs reviewed and stable Respiratory status: spontaneous breathing and patient connected to nasal cannula oxygen Cardiovascular status: stable Postop Assessment: no apparent nausea or vomiting Anesthetic complications: no     Last Vitals:  Vitals:   02/03/18 0930 02/03/18 0945  BP: 134/88 (!) 129/92  Pulse: 86 84  Resp: 14 16  Temp:    SpO2:      Last Pain:  Vitals:   02/03/18 0924  TempSrc:   PainSc: 10-Worst pain ever                 Minerva AreolaYATES,Shenicka Sunderlin

## 2018-02-03 NOTE — Op Note (Signed)
02/03/2018  9:16 AM  PATIENT:  Melvin Turner  62 y.o. male  PRE-OPERATIVE DIAGNOSIS:  PRIMARY OSTEOARTHRITIS KNEE  POST-OPERATIVE DIAGNOSIS:  PRIMARY OSTEOARTHRITIS KNEE  Surgical findings  The medial compartment cartilage degeneration grade 4 tibia and femur  ACL PCL lateral meniscus and medial meniscus were all intact  Lateral compartment normal  Patellofemoral joint normal   PROCEDURE:  Procedure(s): TOTAL KNEE ARTHROPLASTY (Right) [phone]   The patient was identified by 2 approved identification mechanisms. The operative extremity was evaluated and found to be acceptable for surgical treatment today. The chart was reviewed. The surgical site was confirmed and marked.  The patient was taken to the operating room and given appropriate antibiotic 2 g Ancef. This is consistent with the SCIP protocol.  The patient was given the following anesthetic: General anesthesia  The patient was then placed supine on the operating table. A Foley catheter was inserted. The operative extremity was prepped and draped sterilely from the toes to the groin.  Timeout procedure was executed confirming the patient's name, surgical site, antibiotic administration, x-rays available, and implants available.  The operative limb,  was exsanguinated with a six-inch Esmarch and the tourniquet was inflated to 300 mmHg.  A straight midline incision was made over the right KNEE and taken down to the extensor mechanism. A medial arthrotomy was performed. The patella was everted and the patellofemoral band was released. The anterior cruciate ligament and PCL were resected.  The anterior horns of the lateral and medial meniscus were resected. The medial soft tissue sleeve was elevated to the mid coronal plane.  A three-eighths inch drill bit was used to enter the femoral canal which was decompressed with suction and irrigation until clear. The distal femoral cutting guide was set for 11 mm distal resection,   5valgus alignment, for a right knee. The distal femur was resected and checked for flatness.   The external alignment guide for the tibial resection was then applied to the distal and proximal tibia and set for anatomic slope along with 10 MM resection  from the lateral side.  Rotational alignment was set using the malleolus, the tibial tubercle and the tibial spines.  The proximal tibia was resected along with  residual menisci. The tibia was sized using a base plate to a size  4   .  Extension block was placed in the extension gap and was found to be satisfactory  The Depuy Sigma sizing femoral guide was placed and the femur was sized to a size 4. A 4-in-1 cutting block was placed along with collateral ligament retractors and the distal femoral cuts were completed.   Spacer blocks were used to confirm equal flexion extension gaps . A size 10 MM spacer block gave equal stability and flexion extension.  The correct sized notch cutting guide for the femur was then applied and the notch cut was made.  Trial reduction was completed using 4 femur and 4 tibia and 10 polyethylene trial implants. Patella tracking was normal  Patella cartilage was normal except for some peripheral osteophytes and a patelloplasty was performed  The proximal tibia was prepared using the size 4 base plate.  Thorough irrigation was performed and the bone was dried and prepared for cement. The cement was mixed on the back table using third generation preparation techniques  20 cc of dilute Exparel was injected into the soft tissues   Followed by 30 cc of half percent Marcaine with epinephrine   The implants were then cemented in  place and excess cement was removed. The cement was allowed to cure. Irrigation was repeated and excess and residual bone fragments and cement were removed.  The extensor mechanism was closed with #1 Bralon suture followed by subcutaneous tissue closure using 0 Monocryl suture  Skin  approximation was performed using staples  A sterile dressing was applied, followed by a ace wrapped from foot to thigh and then a Cryo/Cuff which was activated   The patient was taken recovery room in stable condition  68F 4T 10 PS NO PATELLA  DEPUY SIGMA, FB PS   SURGEON:  Surgeon(s) and Role:    * Vickki HearingHarrison, Tomiko Schoon E, MD - Primary  PHYSICIAN ASSISTANT:   ASSISTANTS: BETTY ASHLEY DEBBIE [place]   ANESTHESIA:   general  EBL:  10 mL   BLOOD ADMINISTERED:none  DRAINS: none   LOCAL MEDICATIONS USED:  MARCAINE and Amount: 30 ml  PLUS EXPAREL 20   SPECIMEN:  No Specimen  DISPOSITION OF SPECIMEN:  NO  COUNTS:  YES  TOURNIQUET:   Total Tourniquet Time Documented: Thigh (Right) - 74 minutes Total: Thigh (Right) - 74 minutes   DICTATION: .Reubin Milanragon Dictation  PLAN OF CARE: Admit to inpatient   PATIENT DISPOSITION:  PACU - hemodynamically stable.   Delay start of Pharmacological VTE agent (>24hrs) due to surgical blood loss or risk of bleeding: no

## 2018-02-03 NOTE — Transfer of Care (Signed)
Immediate Anesthesia Transfer of Care Note  Patient: Melvin Turner  Procedure(s) Performed: TOTAL KNEE ARTHROPLASTY (Right Knee)  Patient Location: PACU  Anesthesia Type:General  Level of Consciousness: awake and patient cooperative  Airway & Oxygen Therapy: Patient Spontanous Breathing and non-rebreather face mask  Post-op Assessment: Report given to RN and Post -op Vital signs reviewed and stable  Post vital signs: Reviewed and stable  Last Vitals:  Vitals:   02/03/18 0710 02/03/18 0715  BP: 129/76 121/82  Pulse:    Resp: 20 (!) 26  Temp:    SpO2: 96% 95%    Last Pain:  Vitals:   02/03/18 0639  TempSrc: Oral  PainSc: 2       Patients Stated Pain Goal: 10 (02/03/18 [id number])  Complications: No apparent anesthesia complications

## 2018-02-03 NOTE — Anesthesia Procedure Notes (Signed)
Procedure Name: Intubation Date/Time: 02/03/2018 7:31 AM Performed by: Vista Deck, CRNA Pre-anesthesia Checklist: Patient identified, Patient being monitored, Timeout performed, Emergency Drugs available and Suction available Patient Re-evaluated:Patient Re-evaluated prior to induction Oxygen Delivery Method: Circle System Utilized Preoxygenation: Pre-oxygenation with 100% oxygen Induction Type: IV induction Ventilation: Mask ventilation without difficulty Laryngoscope Size: Mac and 3 Grade View: Grade I Tube type: Oral Tube size: 7.0 mm Number of attempts: 1 Airway Equipment and Method: stylet and Oral airway Placement Confirmation: ETT inserted through vocal cords under direct vision,  positive ETCO2 and breath sounds checked- equal and bilateral Secured at: 22 cm Tube secured with: Tape Dental Injury: Teeth and Oropharynx as per pre-operative assessment

## 2018-02-03 NOTE — Brief Op Note (Addendum)
02/03/2018  9:16 AM  PATIENT:  Laqueta CarinaGary F Bertram  62 y.o. male  PRE-OPERATIVE DIAGNOSIS:  PRIMARY OSTEOARTHRITIS KNEE  POST-OPERATIVE DIAGNOSIS:  PRIMARY OSTEOARTHRITIS KNEE  PROCEDURE:  Procedure(s): TOTAL KNEE ARTHROPLASTY (Right) 27447  51F 4T 10 PS NO PATELLA  DEPUY SIGMA, FB PS   SURGEON:  Surgeon(s) and Role:    * Vickki HearingHarrison, Mabel Roll E, MD - Primary  PHYSICIAN ASSISTANT:   ASSISTANTS: BETTY ASHLEY DEBBIE [place]   ANESTHESIA:   general  EBL:  10 mL   BLOOD ADMINISTERED:none  DRAINS: none   LOCAL MEDICATIONS USED:  MARCAINE and Amount: 30 ml  PLUS EXPAREL 20   SPECIMEN:  No Specimen  DISPOSITION OF SPECIMEN:  NO  COUNTS:  YES  TOURNIQUET:   Total Tourniquet Time Documented: Thigh (Right) - 74 minutes Total: Thigh (Right) - 74 minutes   DICTATION: .Reubin Milanragon Dictation  PLAN OF CARE: Admit to inpatient   PATIENT DISPOSITION:  PACU - hemodynamically stable.   Delay start of Pharmacological VTE agent (>24hrs) due to surgical blood loss or risk of bleeding: no

## 2018-02-04 ENCOUNTER — Encounter (HOSPITAL_COMMUNITY): Payer: Self-pay | Admitting: Orthopedic Surgery

## 2018-02-04 LAB — BASIC METABOLIC PANEL
Anion gap: 9 (ref 5–15)
BUN: 18 mg/dL (ref 6–20)
CO2: 24 mmol/L (ref 22–32)
Calcium: 8.7 mg/dL — ABNORMAL LOW (ref 8.9–10.3)
Chloride: 103 mmol/L (ref 101–111)
Creatinine, Ser: 0.78 mg/dL (ref 0.61–1.24)
GFR calc Af Amer: >60 mL/min (ref >60)
GFR calc non Af Amer: >60 mL/min (ref >60)
Glucose, Bld: 120 mg/dL — ABNORMAL HIGH (ref 65–99)
Potassium: 4.1 mmol/L (ref 3.5–5.1)
Sodium: 136 mmol/L (ref 135–145)

## 2018-02-04 LAB — TYPE AND SCREEN
ABO/RH(D): A POS
Antibody Screen: NEGATIVE
Unit division: 0
Unit division: 0
Unit division: 0

## 2018-02-04 LAB — BPAM RBC
Blood Product Expiration Date: 201903292359
Blood Product Expiration Date: 201903312359
Blood Product Expiration Date: 201904052359
ISSUE DATE / TIME: 201903150044
ISSUE DATE / TIME: 201903200051
ISSUE DATE / TIME: 201903200439
Unit Type and Rh: 600
Unit Type and Rh: 600
Unit Type and Rh: 6200

## 2018-02-04 LAB — CBC
HCT: 41.5 % (ref 39.0–52.0)
Hemoglobin: 13 g/dL (ref 13.0–17.0)
MCH: 30 pg (ref 26.0–34.0)
MCHC: 31.3 g/dL (ref 30.0–36.0)
MCV: 95.6 fL (ref 78.0–100.0)
Platelets: 240 10*3/uL (ref 150–400)
RBC: 4.34 MIL/uL (ref 4.22–5.81)
RDW: 13.7 % (ref 11.5–15.5)
WBC: 16.7 10*3/uL — ABNORMAL HIGH (ref 4.0–10.5)

## 2018-02-04 NOTE — Clinical Social Work Note (Signed)
Received CSW consult for SNF placement. This appears to be a standing order. PT is recommending home with HHC PT. RN CM will assist with this dc plan. Will clear the consult.

## 2018-02-04 NOTE — Anesthesia Postprocedure Evaluation (Signed)
Anesthesia Post Note  Patient: Melvin Turner  Procedure(s) Performed: TOTAL KNEE ARTHROPLASTY (Right Knee)  Patient location during evaluation: Nursing Unit Anesthesia Type: General Level of consciousness: awake and alert, oriented and patient cooperative Pain management: pain level controlled Vital Signs Assessment: post-procedure vital signs reviewed and stable Respiratory status: spontaneous breathing and respiratory function stable Cardiovascular status: stable Postop Assessment: no apparent nausea or vomiting Anesthetic complications: no     Last Vitals:  Vitals:   02/03/18 2152 02/04/18 0636  BP: (!) 141/81 132/61  Pulse: 77 68  Resp: 18 20  Temp: (!) 36.3 C 36.7 C  SpO2: 97% 98%    Last Pain:  Vitals:   02/04/18 0636  TempSrc: Oral  PainSc:                  ADAMS, AMY A

## 2018-02-04 NOTE — Addendum Note (Signed)
Addendum  created 02/04/18 0904 by Earleen NewportAdams, Amy A, CRNA   Sign clinical note

## 2018-02-04 NOTE — Progress Notes (Addendum)
Physical Therapy Treatment Patient Details Name: Melvin CarinaGary F Goodenow MRN: [id number] DOB: 05/14/1956 Today's Date: 02/04/2018   RIGHT KNEE ROM: 8-92 degrees AMBULATION DISTANCE: 130 feet using RW with Mod Indep    History of Present Illness Melvin Turner is a 62 y/o male, s/p Right TKA 02/03/18 with the diagnosis of PRIMARY OSTEOARTHRITIS KNEE      PT Comments    Patient progressing well with out of bed activity and able to ambulate to bathroom numerous times last night per patient, slightly increased right knee extension while completing quad sets, limited for end range right knee flexion/extension due to increased pain/stiffness at end ranges.  Patient tolerated sitting up in chair with RLE dangling after therapy and will benefit from continued physical therapy in hospital and recommended venue below to increase strength, balance, endurance for safe ADLs and gait.   Follow Up Recommendations  Home health PT;Supervision - Intermittent     Equipment Recommendations  Rolling walker with 5" wheels    Recommendations for Other Services       Precautions / Restrictions Precautions Precautions: Fall Precaution Comments: s/p Right TKA Restrictions Weight Bearing Restrictions: Yes RLE Weight Bearing: Weight bearing as tolerated Other Position/Activity Restrictions: no pillows underneath right knee    Mobility  Bed Mobility Overal bed mobility: Independent                Transfers Overall transfer level: Modified independent Equipment used: Rolling walker (2 wheeled) Transfers: Sit to/from [employer]GI CorporationStand;Stand Pivot Transfers Sit to Stand: Modified independent (Device/Increase time) Stand pivot transfers: Modified independent (Device/Increase time)       General transfer comment: good return for using RW  Ambulation/Gait Ambulation/Gait assistance: Modified independent (Device/Increase time) Ambulation Distance (Feet): 130 Feet Assistive device: Rolling walker (2 wheeled) Gait  Pattern/deviations: Decreased step length - right;Decreased stride length   Gait velocity interpretation: Below normal speed for age/gender General Gait Details: slightly labored cadence, fair/good return for right heel to toe stepping, no loss of balance   Stairs            Wheelchair Mobility    Modified Rankin (Stroke Patients Only)       Balance Overall balance assessment: Needs assistance Sitting-balance support: Feet supported;No upper extremity supported Sitting balance-Leahy Scale: Good     Standing balance support: Bilateral upper extremity supported;During functional activity Standing balance-Leahy Scale: Fair Standing balance comment: fair/good with RW                            Cognition Arousal/Alertness: Awake/alert Behavior During Therapy: WFL for tasks assessed/performed Overall Cognitive Status: Within Functional Limits for tasks assessed                                        Exercises Total Joint Exercises Quad Sets: Supine;AROM;Strengthening;Right;5 reps(3 second holds) Knee Flexion: Right;Seated(seated right knee self flexion using LLE) Goniometric ROM: RIGHT KNEE: 8-92 DEGREES    General Comments        Pertinent Vitals/Pain Pain Assessment: Faces Faces Pain Scale: Hurts little more Pain Location: right knee Pain Descriptors / Indicators: Sore;Discomfort    Home Living                      Prior Function            PT Goals (current goals can now be  found in the care plan section) Acute Rehab PT Goals Patient Stated Goal: return home with spouse to assist PT Goal Formulation: With patient Time For Goal Achievement: 02/08/18 Potential to Achieve Goals: Good Progress towards PT goals: Progressing toward goals    Frequency    7X/week      PT Plan Current plan remains appropriate    Co-evaluation              AM-PAC PT "6 Clicks" Daily Activity  Outcome Measure  Difficulty  turning over in bed (including adjusting bedclothes, sheets and blankets)?: None Difficulty moving from lying on back to sitting on the side of the bed? : None Difficulty sitting down on and standing up from a chair with arms (e.g., wheelchair, bedside commode, etc,.)?: None Help needed moving to and from a bed to chair (including a wheelchair)?: None Help needed walking in hospital room?: A Little Help needed climbing 3-5 steps with a railing? : A Little 6 Click Score: 22    End of Session   Activity Tolerance: Patient tolerated treatment well;Patient limited by fatigue Patient left: in chair;with call bell/phone within reach Nurse Communication: Mobility status PT Visit Diagnosis: Unsteadiness on feet (R26.81);Other abnormalities of gait and mobility (R26.89);Muscle weakness (generalized) (M62.81)     Time: 1610-9604 PT Time Calculation (min) (ACUTE ONLY): 24 min  Charges:  $Therapeutic Activity: 23-37 mins                    G Codes:       2:21 PM, 03/04/2018 Ocie Bob, MPT Physical Therapist with [facility] [phone] office [phone] mobile phone

## 2018-02-04 NOTE — Progress Notes (Addendum)
Subjective: 1 Day Post-Op Procedure(s) (LRB): TOTAL KNEE ARTHROPLASTY (Right) Patient reports pain as mild.    Objective: Vital signs in last 24 hours: Temp:  [97.4 F (36.3 C)-98.2 F (36.8 C)] 98 F (36.7 C) (03/20 0636) Pulse Rate:  [68-91] 68 (03/20 0636) Resp:  [10-20] 20 (03/20 0636) BP: (109-148)/(61-92) 132/61 (03/20 0636) SpO2:  [92 %-100 %] 98 % (03/20 0636)  Intake/Output from previous day: 03/19 0701 - 03/20 0700 In: 2246.3 [P.O.:340; I.V.:1806.3; IV Piggyback:100] Out: 1210 [Urine:1200; Blood:10] Intake/Output this shift: No intake/output data recorded.  Recent Labs    02/04/18 0611  HGB 13.0   Recent Labs    02/04/18 0611  WBC 16.7*  RBC 4.34  HCT 41.5  PLT 240   Recent Labs    02/04/18 0611  NA 136  K 4.1  CL 103  CO2 24  BUN 18  CREATININE 0.78  GLUCOSE 120*  CALCIUM 8.7*   No results for input(s): LABPT, INR in the last 72 hours.  Neurologically intact Neurovascular intact Sensation intact distally Intact pulses distally Dorsiflexion/Plantar flexion intact Compartment soft  Assessment/Plan: 1 Day Post-Op Procedure(s) (LRB): TOTAL KNEE ARTHROPLASTY (Right) Discharge home with home health tomorrow  Melvin Turner 02/04/2018, 8:27 AM

## 2018-02-05 LAB — CBC
HCT: 38.7 % — ABNORMAL LOW (ref 39.0–52.0)
Hemoglobin: 12.7 g/dL — ABNORMAL LOW (ref 13.0–17.0)
MCH: 31.4 pg (ref 26.0–34.0)
MCHC: 32.8 g/dL (ref 30.0–36.0)
MCV: 95.8 fL (ref 78.0–100.0)
Platelets: 231 10*3/uL (ref 150–400)
RBC: 4.04 MIL/uL — ABNORMAL LOW (ref 4.22–5.81)
RDW: 13.7 % (ref 11.5–15.5)
WBC: 16.8 10*3/uL — ABNORMAL HIGH (ref 4.0–10.5)

## 2018-02-05 MED ORDER — OXYCODONE-ACETAMINOPHEN 5-325 MG PO TABS: 1.0000 | ORAL_TABLET | ORAL | 0 refills | Status: DC | PRN

## 2018-02-05 MED ORDER — METHOCARBAMOL 500 MG PO TABS: 500.0000 mg | ORAL_TABLET | Freq: Four times a day (QID) | ORAL | 2 refills | Status: DC | PRN

## 2018-02-05 MED ORDER — BISACODYL 5 MG PO TBEC: 5.0000 mg | DELAYED_RELEASE_TABLET | Freq: Every day | ORAL | 0 refills | Status: DC | PRN

## 2018-02-05 MED ORDER — ASPIRIN 81 MG PO CHEW: 81.0000 mg | CHEWABLE_TABLET | Freq: Two times a day (BID) | ORAL | 0 refills | Status: DC

## 2018-02-05 NOTE — Progress Notes (Addendum)
Physical Therapy Treatment Patient Details Name: Melvin Turner MRN: [id number] DOB: 23-Oct-1956 Today's Date: 02/05/2018   RIGHT KNEE ROM:5-93degrees AMBULATION DISTANCE:194feetusing RW with Mod Indep CPM PROM: 0-80 degrees    History of Present Illness Melvin Turner is a 62 y/o male, s/p Right TKA 02/03/18 with the diagnosis of PRIMARY OSTEOARTHRITIS KNEE      PT Comments    Patient demonstrates good return for going up/down 3 steps using 1 side rail without loss of balance, increased endurance/distance for gait training with good return for right heel to toe stepping, instructed in use of and applied CPM with good return demonstrated and understanding acknowledged.  Patient will benefit from continued physical therapy in hospital and recommended venue below to increase strength, balance, endurance for safe ADLs and gait.    Follow Up Recommendations  Home health PT;Supervision - Intermittent     Equipment Recommendations  Rolling walker with 5" wheels    Recommendations for Other Services       Precautions / Restrictions Precautions Precautions: Fall Precaution Comments: s/p Right TKA Restrictions Weight Bearing Restrictions: Yes RLE Weight Bearing: Weight bearing as tolerated Other Position/Activity Restrictions: no pillows underneath right knee    Mobility  Bed Mobility Overal bed mobility: Independent                Transfers Overall transfer level: Modified independent Equipment used: Rolling walker (2 wheeled)                Ambulation/Gait Ambulation/Gait assistance: Modified independent (Device/Increase time) Ambulation Distance (Feet): 150 Feet Assistive device: Rolling walker (2 wheeled) Gait Pattern/deviations: Decreased step length - right;Decreased stride length;Decreased stance time - right   Gait velocity interpretation: Below normal speed for age/gender General Gait Details: good return for right heel to toe stepping, no loss of  balance,   Stairs            Wheelchair Mobility    Modified Rankin (Stroke Patients Only)       Balance Overall balance assessment: Mild deficits observed, not formally tested                                          Cognition Arousal/Alertness: Awake/alert Behavior During Therapy: WFL for tasks assessed/performed Overall Cognitive Status: Within Functional Limits for tasks assessed                                        Exercises Total Joint Exercises Quad Sets: Supine;AROM;Strengthening;Right;10 reps Knee Flexion: Right;Seated;5 reps(seated self stretching of right knee using LLE) Goniometric ROM: RIGHT KNEE: 5-93 degrees    General Comments        Pertinent Vitals/Pain Pain Assessment: 0-10 Pain Score: 6  Pain Location: right knee Pain Descriptors / Indicators: Sore;Discomfort    Home Living                      Prior Function            PT Goals (current goals can now be found in the care plan section) Acute Rehab PT Goals Patient Stated Goal: return home with spouse to assist PT Goal Formulation: With patient Time For Goal Achievement: 02/05/18 Potential to Achieve Goals: Good Progress towards PT goals: Progressing toward goals    Frequency  7X/week      PT Plan Current plan remains appropriate    Co-evaluation              AM-PAC PT "6 Clicks" Daily Activity  Outcome Measure  Difficulty turning over in bed (including adjusting bedclothes, sheets and blankets)?: None Difficulty moving from lying on back to sitting on the side of the bed? : None Difficulty sitting down on and standing up from a chair with arms (e.g., wheelchair, bedside commode, etc,.)?: None Help needed moving to and from a bed to chair (including a wheelchair)?: None Help needed walking in hospital room?: None Help needed climbing 3-5 steps with a railing? : A Little 6 Click Score: 23    End of Session    Activity Tolerance: Patient tolerated treatment well Patient left: in bed;in CPM;with call bell/phone within reach;with family/visitor present Nurse Communication: Mobility status PT Visit Diagnosis: Unsteadiness on feet (R26.81);Other abnormalities of gait and mobility (R26.89);Muscle weakness (generalized) (M62.81)     Time: [id number] PT Time Calculation (min) (ACUTE ONLY): 28 min  Charges:  $Therapeutic Activity: 23-37 mins                    G Codes:       2:56 PM, 02/05/18 Ocie BobJames Lacey Wallman, MPT Physical Therapist with [facility] 336 [phone] office [phone] mobile phone

## 2018-02-05 NOTE — Discharge Summary (Signed)
Physician Discharge Summary  Patient ID: Melvin Turner MRN: [id number] DOB/AGE: 1955/11/28 62 y.o.  Admit date: 02/03/2018 Discharge date: 02/05/2018  Admission Diagnoses: OA RIGHT KNEE   Discharge Diagnoses: SAME   Discharged Condition: stable  Procedure: RT TKA DEPUY SIGMA FB PS   Hospital Course: NORMAL      RIGHT KNEE ROM: 8-92 degrees AMBULATION DISTANCE: 130 feet using RW with Mod Indep  CBC Latest Ref Rng & Units 02/05/2018 02/04/2018 01/29/2018  WBC 4.0 - 10.5 K/uL 16.8(H) 16.7(H) 8.9  Hemoglobin 13.0 - 17.0 g/dL 12.7(L) 13.0 13.0  Hematocrit 39.0 - 52.0 % 38.7(L) 41.5 40.9  Platelets 150 - 400 K/uL 231 240 231   BMP Latest Ref Rng & Units 02/04/2018 01/29/2018 11/13/2015  Glucose 65 - 99 mg/dL 409(W) 119(J) 478(G)  BUN 6 - 20 mg/dL 18 17 15   Creatinine 0.61 - 1.24 mg/dL 9.56 2.13 0.86  Sodium 135 - 145 mmol/L 136 141 142  Potassium 3.5 - 5.1 mmol/L 4.1 3.8 4.2  Chloride 101 - 111 mmol/L 103 108 109  CO2 22 - 32 mmol/L 24 23 27   Calcium 8.9 - 10.3 mg/dL 5.7(Q) 8.9 9.6       Discharge Exam: Blood pressure 130/87, pulse 68, temperature (!) 97.5 F (36.4 C), temperature source Oral, resp. rate 20, height 5\' 10"  (1.778 m), weight 210 lb (95.3 kg), SpO2 96 %.   Disposition: Discharge disposition: 01-Home or Self Care       Discharge Instructions    CPM   Complete by:  As directed    Continuous passive motion machine (CPM):      Use the CPM from 0 to 75 for 8 hours per day.      You may increase by 10 per day.  You may break it up into 2 or 3 sessions per day.      Use CPM for 2 weeks or until you are told to stop.   Call MD / Call 911   Complete by:  As directed    If you experience chest pain or shortness of breath, CALL 911 and be transported to the hospital emergency room.  If you develope a fever above 101 F, pus (white drainage) or increased drainage or redness at the wound, or calf pain, call your surgeon's office.   Change dressing   Complete by:   As directed    Do not Change dressing   Constipation Prevention   Complete by:  As directed    Drink plenty of fluids.  Prune juice may be helpful.  You may use a stool softener, such as Colace (over the counter) 100 mg twice a day.  Use MiraLax (over the counter) for constipation as needed.   Diet - low sodium heart healthy   Complete by:  As directed    Discharge instructions   Complete by:  As directed    Use bone foam 1 hr 3 x a day   Do not put a pillow under the knee. Place it under the heel.   Complete by:  As directed    Driving restrictions   Complete by:  As directed    No driving for 1 weeks   Increase activity slowly as tolerated   Complete by:  As directed      Allergies as of 02/05/2018      Reactions   Poison Ivy Extract [poison Ivy Extract] Hives, Rash      Medication List    STOP taking these medications  HYDROcodone-acetaminophen 5-325 MG tablet Commonly known as:  NORCO/VICODIN   naproxen 500 MG tablet Commonly known as:  NAPROSYN     TAKE these medications   amLODipine 5 MG tablet Commonly known as:  NORVASC   aspirin 81 MG chewable tablet Chew 1 tablet (81 mg total) by mouth 2 (two) times daily.   benazepril 20 MG tablet Commonly known as:  LOTENSIN Take 20 mg by mouth daily.   bisacodyl 5 MG EC tablet Commonly known as:  DULCOLAX Take 1 tablet (5 mg total) by mouth daily as needed for moderate constipation.   L-ARGININE-500 PO Take 1 tablet by mouth daily.   methocarbamol 500 MG tablet Commonly known as:  ROBAXIN Take 1 tablet (500 mg total) by mouth every 6 (six) hours as needed for muscle spasms.   multivitamin with minerals Tabs tablet Take 1 tablet by mouth daily.   omeprazole 20 MG capsule Commonly known as:  PRILOSEC   oxyCODONE-acetaminophen 5-325 MG tablet Commonly known as:  PERCOCET Take 1 tablet by mouth every 4 (four) hours as needed for severe pain.   tamsulosin 0.4 MG Caps capsule Commonly known as:  FLOMAX Take  0.4 mg by mouth.   Vitamin C CR 1000 MG Tbcr Take 1 tablet by mouth daily.            Durable Medical Equipment  (From admission, onward)        Start     Ordered   02/03/18 1357  For home use only DME Walker rolling  Once    Question:  Patient needs a walker to treat with the following condition  Answer:  S/P TKR (total knee replacement)   02/03/18 1357   02/03/18 1357  For home use only DME 3 n 1  Once     02/03/18 1357       Discharge Care Instructions  (From admission, onward)        Start     Ordered   02/05/18 0000  Change dressing    Comments:  Do not Change dressing   02/05/18 1258     Follow-up Information    Vickki HearingHarrison, Deke Tilghman E, MD Follow up.   Specialties:  Orthopedic Surgery, Radiology Contact information: [address] [place] [address] [phone]           Signed: Fuller CanadaStanley Floride Hutmacher 02/05/2018, 1:00 PM

## 2018-02-05 NOTE — Progress Notes (Signed)
Discharge instructions read to patient and his family. All verbalized understanding of instructions.  Discharged to home with family 

## 2018-02-05 NOTE — Care Management Note (Signed)
Case Management Note  Patient Details  Name: Melvin Turner MRN: [id number] Date of Birth: 09-23-1956   Expected Discharge Date:  02/05/18               Expected Discharge Plan:  Home w Home Health Services  In-House Referral:  NA  Discharge planning Services  CM Consult  Post Acute Care Choice:  Durable Medical Equipment, Home Health Choice offered to:  Patient  DME Arranged:  Continuous passive motion machine, Walker rolling, 3-N-1 DME Agency:  [facility]  [facility]  [facility]  Status of Service:  Completed, signed off  Additional Comments: DC home today. DME has been delivered. Olegario MessierKathy, [facility] rep, aware of referral and will pull pt info from chart.   [name], [name], RN 02/05/2018, 1:05 PM

## 2018-02-10 ENCOUNTER — Other Ambulatory Visit: Payer: Self-pay | Admitting: Orthopedic Surgery

## 2018-02-10 NOTE — Telephone Encounter (Signed)
Oxycodone-Acetaminophen 5/325 mg Qty 30 Tablets  Take 1 tablet by mouth every 4 (four) hours as needed for severe pain.  [facility]

## 2018-02-11 MED ORDER — OXYCODONE-ACETAMINOPHEN 5-325 MG PO TABS: 1.0000 | ORAL_TABLET | ORAL | 0 refills | Status: DC | PRN

## 2018-02-12 ENCOUNTER — Other Ambulatory Visit: Payer: Self-pay | Admitting: Radiology

## 2018-02-12 MED ORDER — OXYCODONE-ACETAMINOPHEN 5-325 MG PO TABS: 1.0000 | ORAL_TABLET | ORAL | 0 refills | Status: DC | PRN

## 2018-02-12 NOTE — Telephone Encounter (Signed)
rx did not go through yesterday, looks like was set to print, I have reset to normal [facility] in [place] will you resend ?

## 2018-02-18 ENCOUNTER — Other Ambulatory Visit: Payer: Self-pay | Admitting: Orthopedic Surgery

## 2018-02-18 ENCOUNTER — Ambulatory Visit (INDEPENDENT_AMBULATORY_CARE_PROVIDER_SITE_OTHER): Payer: Self-pay | Admitting: Orthopedic Surgery

## 2018-02-18 VITALS — BP 155/96 | HR 75 | Ht 70.0 in | Wt 220.0 lb

## 2018-02-18 DIAGNOSIS — Z96651 Presence of right artificial knee joint: Secondary | ICD-10-CM

## 2018-02-18 DIAGNOSIS — G8929 Other chronic pain: Secondary | ICD-10-CM

## 2018-02-18 DIAGNOSIS — M5416 Radiculopathy, lumbar region: Secondary | ICD-10-CM

## 2018-02-18 MED ORDER — HYDROCODONE-ACETAMINOPHEN 10-325 MG PO TABS: 1.0000 | ORAL_TABLET | ORAL | 0 refills | Status: DC | PRN

## 2018-02-18 MED ORDER — PREDNISONE 10 MG (48) PO TBPK: ORAL_TABLET | Freq: Every day | ORAL | 1 refills | Status: DC

## 2018-02-18 MED ORDER — GABAPENTIN 100 MG PO CAPS: 100.0000 mg | ORAL_CAPSULE | Freq: Three times a day (TID) | ORAL | 2 refills | Status: DC

## 2018-02-18 NOTE — Progress Notes (Signed)
POSTOP VISIT   POD # 15  BP (!) 155/96   Pulse 75   Ht 5\' 10"  (1.778 m)   Wt 220 lb (99.8 kg)   BMI 31.57 kg/m   Encounter Diagnoses  Name Primary?  . 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 Chronic radicular pain of lower back    He is complaining of right lower back pain and right leg pain radiating to his foot.  Apparently he has some history of degenerative disc disease [facility] [place] Dr. Channing Muttersoy no surgery was done at that time  His range of motion is 0-85 degrees he has no signs of infection his staples were removed The surgical site incision clean dry and intact with no drainage   I have informed him that he is going to have a lot of difficulty with this back situation he says that the oxycodone does not help his pain.  He did try to participate in the opioid holiday 30 days prior to surgery with 1 or 2 days where he had to take pain medication so that is also an issue  Recommend outpatient physical therapy  He will follow-up in 4 weeks  We will add some prednisone and gabapentin to try to help his radicular pain

## 2018-02-23 ENCOUNTER — Other Ambulatory Visit: Payer: Self-pay | Admitting: Orthopedic Surgery

## 2018-02-23 DIAGNOSIS — Z96651 Presence of right artificial knee joint: Secondary | ICD-10-CM

## 2018-02-24 ENCOUNTER — Other Ambulatory Visit: Payer: Self-pay

## 2018-02-24 ENCOUNTER — Ambulatory Visit (HOSPITAL_COMMUNITY): Payer: No Typology Code available for payment source | Attending: Orthopedic Surgery

## 2018-02-24 ENCOUNTER — Telehealth (HOSPITAL_COMMUNITY): Payer: Self-pay

## 2018-02-24 ENCOUNTER — Encounter (HOSPITAL_COMMUNITY): Payer: Self-pay

## 2018-02-24 DIAGNOSIS — M6281 Muscle weakness (generalized): Secondary | ICD-10-CM | POA: Diagnosis present

## 2018-02-24 DIAGNOSIS — M25661 Stiffness of right knee, not elsewhere classified: Secondary | ICD-10-CM | POA: Diagnosis present

## 2018-02-24 DIAGNOSIS — M25561 Pain in right knee: Secondary | ICD-10-CM | POA: Diagnosis not present

## 2018-02-24 DIAGNOSIS — R2689 Other abnormalities of gait and mobility: Secondary | ICD-10-CM | POA: Insufficient documentation

## 2018-02-24 NOTE — Telephone Encounter (Signed)
4/8 I called and left a message to get Eligibility /Benefits... a representative is supposed to call back.  MM FOTO ready Referral under REFERRAL TAB

## 2018-02-24 NOTE — Telephone Encounter (Signed)
3xwk/4wks per RQ [facility] GPA  1/1/-11/17/2018 Deduct Ind [phone] Fam 4500-413.71 Co ins 20%  OOP Ind 4500-380.00  Fam 10,200-400.00 NO AUTH REQUIRED visit - medical necessity NO AUTH  Billing Dept will send with first claim -Referral from MD and clinicals. Ins Accounts are Audited by [employer] to determine plans allowable based on MCR rates +20% or the self reported provider cost +12% whichever is higher of the two calulations. Spoke to KendallMargarita Ref [id number] and Ms. Anna P. NO AUTH. NF 02/24/18

## 2018-02-24 NOTE — Patient Instructions (Signed)
    KNEE FLEXION STRETCH - SELF ASSISTED: 3 times for 30-60 seconds  While seated in a chair, use your unaffected leg to bend your affected knee until a stretch is felt.     QUAD SET WITH TOWEL UNDER HEEL: 1-2 sets of 10-20 reps with 5 second holds at end range   While lying or sitting with a small towel roll under your ankle, tighten your top thigh muscle to press the back of your knee downward towards the ground.

## 2018-02-24 NOTE — Therapy (Signed)
[facility] [address] [place], [place], [address] Phone: [phone]   Fax:  [phone]  Physical Therapy Evaluation  Patient Details  Name: Melvin Turner MRN: [id number] Date of Birth: June 03, 1956 Referring Provider: Fuller Canada, MD   Encounter Date: 02/24/2018  PT End of Session - 02/24/18 1111    Visit Number  1    Number of Visits  19    Date for PT Re-Evaluation  04/07/18 min-re-assess on 03/17/18    Authorization Type  [employer] MULTIPLAN    Authorization Time Period  02/24/2018 - 04/07/2018    PT Start Time  1116    PT Stop Time  1205    PT Time Calculation (min)  49 min    Activity Tolerance  Patient tolerated treatment well    Behavior During Therapy  [facility] for tasks assessed/performed       Past Medical History:  Diagnosis Date  . Arthritis   . BPH (benign prostatic hyperplasia)   . GERD (gastroesophageal reflux disease)   . Hypertension     Past Surgical History:  Procedure Laterality Date  . COLONOSCOPY    . KNEE ARTHROSCOPY Right 01/31/2015   Procedure: RIGHT KNEE ARTHROSCOPY, PARTIAL MEDIAL MENISECTOMY;  Surgeon: Darreld Mclean, MD;  Location: [facility];  Service: Orthopedics;  Laterality: Right;  . TONSILLECTOMY    . TOTAL KNEE ARTHROPLASTY Right 02/03/2018   Procedure: TOTAL KNEE ARTHROPLASTY;  Surgeon: Vickki Hearing, MD;  Location: [facility];  Service: Orthopedics;  Laterality: Right;  Marland Kitchen VASECTOMY      There were no vitals filed for this visit.   Subjective Assessment - 02/24/18 1118    Subjective  Patient reports he had his follow up with Dr. Romeo Apple last week and was told he is behind in [place] as far as where he would like to see him. 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 He reports his knee is sore and achy and that his right thigh has been bothering him as well. He reports he has not kept ups with his  HEP from the [facility] as much as he should be.    Limitations  Sitting;Standing;Walking;Lifting;House hold activities    Patient Stated Goals  improve motion and return to work    Currently in Pain?  Yes    Pain Score  6     Pain Location  Knee    Pain Orientation  Right    Pain Descriptors / Indicators  Aching    Pain Type  Surgical pain    Pain Onset  1 to 4 weeks ago    Pain Frequency  Constant    Aggravating Factors   walking, bendign and straightening knee    Pain Relieving Factors  ice, medicine         [facility] PT Assessment - 02/24/18 0001      Assessment   Medical Diagnosis  Right TKA    Referring Provider  Fuller Canada, MD    Onset Date/Surgical Date  02/03/18    Next MD Visit  Mar 18, 2018    Prior Therapy  [facility]PT last Firday was last appt      Precautions   Precautions  None      Restrictions   Weight Bearing Restrictions  No      Balance Screen   Has the patient fallen in  the past 6 months  No    Has the patient had a decrease in activity level because of a fear of falling?   Yes    Is the patient reluctant to leave their home because of a fear of falling?   No      Home Public house manager residence    Living Arrangements  Spouse/significant other dogs and cats    Available Help at Discharge  Family    Type of Home  House    Home Access  Stairs to enter    Entrance Stairs-Number of Steps  2    Entrance Stairs-Rails  None    Home Layout  One level    Home Equipment  Walker - 2 wheels;Cane - single point      Prior Function   Level of Independence  Independent    Vocation Requirements  climb ladders, stairs, be able to walk around 12 hours a day    Leisure  working outside, used to work Holiday representative work, riding his motorcycle just around town      Cognition   Overall Cognitive Status  Within Functional Limits for tasks assessed      Observation/Other Assessments   Focus on Therapeutic Outcomes (FOTO)   61% limited       Observation/Other Assessments-Edema    Edema  Circumferential      Circumferential Edema   Circumferential - Right  17.5    Circumferential - Left   16.5      Functional Tests   Functional tests  Single leg stance      Single Leg Stance   Comments  Lt LE = 30 seconds, Rt LE = 2 seconds      ROM / Strength   AROM / PROM / Strength  AROM;Strength      AROM   AROM Assessment Site  Knee    Right/Left Knee  Right;Left    Right Knee Extension  16    Right Knee Flexion  80    Left Knee Extension  0    Left Knee Flexion  130      Strength   Overall Strength Comments  pain while laying on Rt knee and with strength testing of Rt knee/hip    Strength Assessment Site  Hip;Knee;Ankle    Right Hip Flexion  4/5    Right Hip Extension  4-/5    Right Hip ABduction  4-/5    Left Hip Flexion  4+/5    Left Hip Extension  5/5    Left Hip ABduction  5/5    Right/Left Knee  Right;Left    Right Knee Flexion  4-/5    Right Knee Extension  4/5    Left Knee Flexion  5/5    Left Knee Extension  5/5    Right Ankle Dorsiflexion  4/5    Left Ankle Dorsiflexion  5/5      Palpation   Patella mobility  hypomobile in Rt knee      Transfers   Five time sit to stand comments   14.36; no UE use however relies on momentum to initiate standing      Ambulation/Gait   Ambulation/Gait  Yes    Ambulation/Gait Assistance  6: Modified independent (Device/Increase time)    Ambulation Distance (Feet)  372 Feet 2 MWT    Assistive device  Straight cane    Gait Pattern  Step-to pattern;Decreased stance time - right;Decreased hip/knee flexion - right;Decreased weight  shift to right    Ambulation Surface  Level    Gait velocity  0.94 m/s       Objective measurements completed on examination: See above findings.      [facility] Adult PT Treatment/Exercise - 02/24/18 0001      Exercises   Exercises  Knee/Hip      Knee/Hip Exercises: Stretches   Knee: Self-Stretch to increase Flexion  Right;30 seconds;3 reps     Knee: Self-Stretch Limitations  AAROM in seated      Knee/Hip Exercises: Supine   Quad Sets  Right;1 set;10 reps    Quad Sets Limitations  3 second holds         PT Education - 02/24/18 1208    Education provided  Yes    Education Details  Educated on exam findings and appropriate POC. Instrcted on initial HEP and educated on prone knee hangs for low load long duration stretching.  Educated on icing for pain and elevating to decrease edema.    Person(s) Educated  Patient    Methods  Explanation;Handout    Comprehension  Verbalized understanding;Returned demonstration       PT Short Term Goals - 02/24/18 1211      PT SHORT TERM GOAL #1   Title  Patient will be independent with HEP to improve functinoal strength and ROM to return to PLOF.    Time  2    Period  Weeks    Status  New    Target Date  03/10/18      PT SHORT TERM GOAL #2   Title  AROM of Rt knee extension and flexion will improve by 8 degrees or more to allow more normalized gait.    Time  3    Period  Weeks    Status  New    Target Date  03/17/18      PT SHORT TERM GOAL #3   Title  Patient will perform 5 time sit to stand in 14 seconds with no use of UE and no evidence of momentum to initiate stand.     Time  3    Period  Weeks    Status  New      PT SHORT TERM GOAL #4   Title  Patient will be able to perform SLS on Rt LE for 10 seconds or greater to improve balance for increased safety with stairs and gait.    Time  3    Period  Weeks    Status  New      PT SHORT TERM GOAL #5   Title  Patient will improve MMT by 1/2 grade to demonstrate increased strnehgt for more normalized gait and mobility to return to PLOF and work.         PT Long Term Goals - 02/24/18 1217      PT LONG TERM GOAL #1   Title  AROM of Rt knee extension and flexion will improve to 0-110 degrees for more to allow more normalized gait.    Time  6    Period  Weeks    Status  New    Target Date  04/07/18      PT LONG TERM GOAL  #2   Title  Patient will be abel to perform SLS on Rt LE for 10 seconds or greater to improve balance for increased safety with stairs and gait.    Time  6    Period  Weeks    Status  New  PT LONG TERM GOAL #3   Title  Patient will ambulate during at 1.2 m/s with normalized gait pattern to demonstrate decresaed risk of falling and improved community ambulation to return to [facility] and work.     Time  6    Period  Weeks    Status  New      PT LONG TERM GOAL #4   Title  Patient will ascend/descend 12x 6" stairs wtih step over step pattern and 1 hand rail or less to demonstrate safety and improved independence with mobility to return to PLOF.     Time  6    Period  Weeks    Status  New      PT LONG TERM GOAL #5   Title  Patient will improve MMT by 1 grade to demonstrate increased strnehgt for more normalized gait and mobility to return to PLOF and work.     Time  6    Period  Weeks    Status  New        Plan - 02/24/18 1210    Clinical Impression Statement  Mr. Mesta presents for initial evaluation at outpatient PT 3 weeks s/p Rt TKA. He just finished with HHPT last Friday and has been told by his surgeon that his ROM is more limited than expected. He is currently presenting with ROM from 16-80 degrees on his Rt knee. Impairments include: localized edema, myofascial restrictions, decreased ROM, pain, impaired balance, difficulty with gait/mobility, decreased scar mobility, and improper posture. He will benefit from skilled PT services to address impairments and progress towards goals to improve QOL and return to PLOF.    Clinical Presentation  Stable    Clinical Presentation due to:  FOTO, MMT, giat, SLS, clinical judgement    Clinical Decision Making  Low    Rehab Potential  Fair    PT Frequency  3x / week    PT Duration  6 weeks    PT Treatment/Interventions  ADLs/Self Care Home Management;Aquatic Therapy;Cryotherapy;Electrical Stimulation;DME Instruction;Gait training;Stair  training;Functional mobility training;Therapeutic activities;Therapeutic exercise;Balance training;Neuromuscular re-education;Patient/family education;Manual techniques;Scar mobilization;Passive range of motion;Energy conservation;Taping    PT Home Exercise Plan  Eval: quad set, seated AAROM for knee flexion;     Consulted and Agree with Plan of Care  Patient       Patient will benefit from skilled therapeutic intervention in order to improve the following deficits and impairments:  Abnormal gait, Decreased skin integrity, Increased fascial restricitons, Pain, Decreased scar mobility, Decreased mobility, Decreased activity tolerance, Decreased endurance, Decreased range of motion, Decreased strength, Hypomobility, Impaired flexibility, Difficulty walking, Decreased balance  Visit Diagnosis: Right knee pain, unspecified chronicity  Stiffness of right knee, not elsewhere classified  Other abnormalities of gait and mobility  Muscle weakness (generalized)     Problem List Patient Active Problem List   Diagnosis Date Noted  . 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 [place], [place], [address] Phone: [phone]   Fax:  [phone]  Name: Melvin Turner MRN: [id number] Date of Birth: 20-May-1956

## 2018-02-25 ENCOUNTER — Encounter (HOSPITAL_COMMUNITY): Payer: Self-pay

## 2018-02-25 ENCOUNTER — Ambulatory Visit (HOSPITAL_COMMUNITY): Payer: No Typology Code available for payment source

## 2018-02-25 DIAGNOSIS — M6281 Muscle weakness (generalized): Secondary | ICD-10-CM

## 2018-02-25 DIAGNOSIS — M25561 Pain in right knee: Secondary | ICD-10-CM | POA: Diagnosis not present

## 2018-02-25 DIAGNOSIS — M25661 Stiffness of right knee, not elsewhere classified: Secondary | ICD-10-CM

## 2018-02-25 DIAGNOSIS — R2689 Other abnormalities of gait and mobility: Secondary | ICD-10-CM

## 2018-02-25 NOTE — Therapy (Signed)
[facility] [address] [place], [place], [address] Phone: [phone]   Fax:  [phone]  Physical Therapy Treatment  Patient Details  Name: Melvin Turner MRN: [id number] Date of Birth: 21-Jun-1956 Referring Provider: Fuller CanadaHarrison, Stanley, MD   Encounter Date: 02/25/2018  PT End of Session - 02/25/18 1418    Visit Number  2    Number of Visits  19    Date for PT Re-Evaluation  04/07/18 min-re-assess on 03/17/18    Authorization Type  [employer]CS MULTIPLAN    Authorization Time Period  02/24/2018 - 04/07/2018    Authorization - Visit Number  2    Authorization - Number of Visits  10    PT Start Time  1033    PT Stop Time  1113    PT Time Calculation (min)  40 min    Activity Tolerance  Patient tolerated treatment well    Behavior During Therapy  [facility] for tasks assessed/performed       Past Medical History:  Diagnosis Date  . Arthritis   . BPH (benign prostatic hyperplasia)   . GERD (gastroesophageal reflux disease)   . Hypertension     Past Surgical History:  Procedure Laterality Date  . COLONOSCOPY    . KNEE ARTHROSCOPY Right 01/31/2015   Procedure: RIGHT KNEE ARTHROSCOPY, PARTIAL MEDIAL MENISECTOMY;  Surgeon: Darreld McleanWayne Keeling, MD;  Location: [facility];  Service: Orthopedics;  Laterality: Right;  . TONSILLECTOMY    . TOTAL KNEE ARTHROPLASTY Right 02/03/2018   Procedure: TOTAL KNEE ARTHROPLASTY;  Surgeon: Vickki HearingHarrison, Stanley E, MD;  Location: [facility];  Service: Orthopedics;  Laterality: Right;  Marland Kitchen. VASECTOMY      There were no vitals filed for this visit.  Subjective Assessment - 02/25/18 1041    Subjective  Patient is feeling good overall today however he is sore. He reports his knee is constantly sore and that he is frustrated with the pain because no one warned him that it would be this bad.    Limitations  Sitting;Standing;Walking;Lifting;House hold activities    Patient Stated Goals  improve motion and return to work    Currently in Pain?   Yes    Pain Score  6     Pain Location  Knee    Pain Orientation  Right    Pain Descriptors / Indicators  Aching;Sore    Pain Type  Surgical pain    Pain Onset  1 to 4 weeks ago    Pain Frequency  Constant    Aggravating Factors   walking, bending knee, straigthening knee, it is always there    Pain Relieving Factors  ice, medicine        [facility] Adult PT Treatment/Exercise - 02/25/18 0001      Knee/Hip Exercises: Stretches   Active Hamstring Stretch  Right;3 reps;30 seconds;Limitations    Active Hamstring Stretch Limitations  12" box, AP pressure on anterior thight above knee for self mobilization    Knee: Self-Stretch to increase Flexion  Right;30 seconds;3 reps    Knee: Self-Stretch Limitations  AAROM in seated      Knee/Hip Exercises: Seated   Other Seated Knee/Hip Exercises  Biodex for flexion/extension: 10 minutes for PROM and end range stretching. Progressed from 75% to 95% into extension and from 75%  to 85% of flexion ROM. Patient reporting greater discomfort bending knee than straightening.      Knee/Hip Exercises: Supine   Short Arc Quad Sets  Strengthening;Right;2 sets;15 reps  Heel Slides  AROM;Right;2 sets;15 reps;Limitations    Heel Slides Limitations  5 second holds at end range      Manual Therapy   Manual Therapy  Edema management    Manual therapy comments  completed seperate from other skilled therapy    Edema Management  Retrograde massage performed with Rt LE elevated, 12 minutes. Patient instructed on on proper retrograde massage technique and to begin at the ankle and move upward towards knee and thigh. He was educated on light pressure for retrograde massage to reduce edema as deep pressure may increase fluid present.         PT Education - 02/25/18 1046    Education provided  Yes    Education Details  Reviewed evaluation and goals with patient. Educated on additional exercises for HEP. Educated on purpose of interventions today and on exercise form  throughot.     Person(s) Educated  Patient    Methods  Explanation;Handout    Comprehension  Verbalized understanding;Returned demonstration       PT Short Term Goals - 02/24/18 1211      PT SHORT TERM GOAL #1   Title  Patient will be independent with HEP to improve functinoal strength and ROM to return to PLOF.    Time  2    Period  Weeks    Status  New    Target Date  03/10/18      PT SHORT TERM GOAL #2   Title  AROM of Rt knee extension and flexion will improve by 8 degrees or more to allow more normalized gait.    Time  3    Period  Weeks    Status  New    Target Date  03/17/18      PT SHORT TERM GOAL #3   Title  Patient will perform 5 time sit to stand in 14 seconds with no use of UE and no evidence of momentum to initiate stand.     Time  3    Period  Weeks    Status  New      PT SHORT TERM GOAL #4   Title  Patient will be able to perform SLS on Rt LE for 10 seconds or greater to improve balance for increased safety with stairs and gait.    Time  3    Period  Weeks    Status  New      PT SHORT TERM GOAL #5   Title  Patient will improve MMT by 1/2 grade to demonstrate increased strnehgt for more normalized gait and mobility to return to PLOF and work.         PT Long Term Goals - 02/24/18 1217      PT LONG TERM GOAL #1   Title  AROM of Rt knee extension and flexion will improve to 0-110 degrees for more to allow more normalized gait.    Time  6    Period  Weeks    Status  New    Target Date  04/07/18      PT LONG TERM GOAL #2   Title  Patient will be abel to perform SLS on Rt LE for 10 seconds or greater to improve balance for increased safety with stairs and gait.    Time  6    Period  Weeks    Status  New      PT LONG TERM GOAL #3   Title  Patient will ambulate during at  1.2 m/s with normalized gait pattern to demonstrate decresaed risk of falling and improved community ambulation to return to [facility] and work.     Time  6    Period  Weeks     Status  New      PT LONG TERM GOAL #4   Title  Patient will ascend/descend 12x 6" stairs wtih step over step pattern and 1 hand rail or less to demonstrate safety and improved independence with mobility to return to PLOF.     Time  6    Period  Weeks    Status  New      PT LONG TERM GOAL #5   Title  Patient will improve MMT by 1 grade to demonstrate increased strnehgt for more normalized gait and mobility to return to PLOF and work.     Time  6    Period  Weeks    Status  New         Plan - 02/25/18 1420    Clinical Impression Statement  Session began with reviewing evaluation and goals for therapy. Treatment today focused on ROM exercises and PROM for stretching into end range. Patient was educated on Biodex purpose ROM limits were increased periodically by therapist based on patient tolerance. He reported decreased stiffness in Rt knee following intervention and it was followed by active stretching to facilitate further ROM gains. Manual therapy was performed at [facility] for ongoing edema at Rt knee and patient was educated on retrograde massage technique and appropriate ice/elevation methods to manage edema. I discussed the benefits of a compression garment with him and informed him if his edema does not decrease over the next couple of sessions we will measure him for a garment. He will benefit from skilled PT services to address impairments and progress towards goals to improve QOL and return to PLOF.    Rehab Potential  Fair    PT Frequency  3x / week    PT Duration  6 weeks    PT Treatment/Interventions  ADLs/Self Care Home Management;Aquatic Therapy;Cryotherapy;Electrical Stimulation;DME Instruction;Gait training;Stair training;Functional mobility training;Therapeutic activities;Therapeutic exercise;Balance training;Neuromuscular re-education;Patient/family education;Manual techniques;Scar mobilization;Passive range of motion;Energy conservation;Taping    PT Home Exercise Plan  Eval: quad  set, seated AAROM for knee flexion; 02/25/18 - SAQ;     Consulted and Agree with Plan of Care  Patient       Patient will benefit from skilled therapeutic intervention in order to improve the following deficits and impairments:  Abnormal gait, Decreased skin integrity, Increased fascial restricitons, Pain, Decreased scar mobility, Decreased mobility, Decreased activity tolerance, Decreased endurance, Decreased range of motion, Decreased strength, Hypomobility, Impaired flexibility, Difficulty walking, Decreased balance  Visit Diagnosis: Right knee pain, unspecified chronicity  Stiffness of right knee, not elsewhere classified  Other abnormalities of gait and mobility  Muscle weakness (generalized)     Problem List Patient Active Problem List   Diagnosis Date Noted  . 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 [place], [place], [address] Phone: [phone]   Fax:  [phone]  Name: OSBORN PULLIN MRN: [id number] Date of Birth: 01-Jul-1956

## 2018-02-25 NOTE — Patient Instructions (Signed)
   SHORT ARC QUAD  - SAQ: 1-2 sets of 15-25 repetitions  Place a rolled up towel or object under your knee and slowly straighten your knee as your raise up  your foot.

## 2018-02-27 ENCOUNTER — Ambulatory Visit (HOSPITAL_COMMUNITY): Payer: No Typology Code available for payment source

## 2018-02-27 ENCOUNTER — Telehealth (HOSPITAL_COMMUNITY): Payer: Self-pay | Admitting: Internal Medicine

## 2018-02-27 DIAGNOSIS — R2689 Other abnormalities of gait and mobility: Secondary | ICD-10-CM

## 2018-02-27 DIAGNOSIS — M25561 Pain in right knee: Secondary | ICD-10-CM | POA: Diagnosis not present

## 2018-02-27 DIAGNOSIS — M25661 Stiffness of right knee, not elsewhere classified: Secondary | ICD-10-CM

## 2018-02-27 DIAGNOSIS — M6281 Muscle weakness (generalized): Secondary | ICD-10-CM

## 2018-02-27 NOTE — Telephone Encounter (Signed)
02/27/18  1:02 left him a message to see if he could come in at 3;15 today

## 2018-02-27 NOTE — Therapy (Signed)
[facility] [address] [place], [place], [address] Phone: [phone]   Fax:  [phone]  Physical Therapy Treatment  Patient Details  Name: Melvin Turner MRN: [id number] Date of Birth: November 29, 1955 Referring Provider: Fuller Canada, MD   Encounter Date: 02/27/2018  PT End of Session - 02/27/18 1548    Visit Number  3    Number of Visits  19    Date for PT Re-Evaluation  04/07/18    Authorization Type  [employer] MULTIPLAN    Authorization Time Period  02/24/2018 - 04/07/2018 (reAssessment on    Authorization - Visit Number  3    Authorization - Number of Visits  10    PT Start Time  1520    PT Stop Time  1600    PT Time Calculation (min)  40 min    Activity Tolerance  Patient tolerated treatment well;No increased pain    Behavior During Therapy  WFL for tasks assessed/performed       Past Medical History:  Diagnosis Date  . Arthritis   . BPH (benign prostatic hyperplasia)   . GERD (gastroesophageal reflux disease)   . Hypertension     Past Surgical History:  Procedure Laterality Date  . COLONOSCOPY    . KNEE ARTHROSCOPY Right 01/31/2015   Procedure: RIGHT KNEE ARTHROSCOPY, PARTIAL MEDIAL MENISECTOMY;  Surgeon: Darreld Mclean, MD;  Location: [facility];  Service: Orthopedics;  Laterality: Right;  . TONSILLECTOMY    . TOTAL KNEE ARTHROPLASTY Right 02/03/2018   Procedure: TOTAL KNEE ARTHROPLASTY;  Surgeon: Vickki Hearing, MD;  Location: [facility];  Service: Orthopedics;  Laterality: Right;  Marland Kitchen VASECTOMY      There were no vitals filed for this visit.  Subjective Assessment - 02/27/18 1529    Subjective  Pt doing good today. Cont to have sig pain at night, limiting to sleep. HEP ok, but struggles with heel slides as knee flexion stretch.     Currently in Pain?  Yes    Pain Score  5     Pain Location  Knee    Pain Orientation  Right    Pain Descriptors / Indicators  Aching    Pain Type  Surgical pain         [facility] Adult PT  Treatment/Exercise - 02/27/18 0001      Knee/Hip Exercises: Seated   Long Arc Quad  1 set;10 reps    Heel Slides  Right;1 set 2 minutes with towel underfoot, flexion & extension stretc    Other Seated Knee/Hip Exercises  seated knee distraction, knee hang c 5lb 1x65minutes    Hamstring Curl  Right;1 set;15 reps combine dwith 5lb distraction     Hamstring Weights  0 lbs.      Knee/Hip Exercises: Supine   Quad Sets  20 reps;Right;AROM 3secH    Short Arc Quad Sets  2 sets;Limitations 2x12    Short Arc Quad Sets Limitations  2lb weight    Heel Slides  AROM;Right;1 set;20 reps 3secH    Bridges  2 sets 2x10 (hamstring sbridge    Knee Extension  PROM;Right;Limitations    Knee Extension Limitations  23 degrees    Knee Flexion  PROM;Right;Limitations      Knee/Hip Exercises: Prone   Hamstring Curl  2 sets;15 reps        PT Short Term Goals - 02/24/18 1211      PT SHORT TERM GOAL #1   Title  Patient will be independent with  HEP to improve functinoal strength and ROM to return to PLOF.    Time  2    Period  Weeks    Status  New    Target Date  03/10/18      PT SHORT TERM GOAL #2   Title  AROM of Rt knee extension and flexion will improve by 8 degrees or more to allow more normalized gait.    Time  3    Period  Weeks    Status  New    Target Date  03/17/18      PT SHORT TERM GOAL #3   Title  Patient will perform 5 time sit to stand in 14 seconds with no use of UE and no evidence of momentum to initiate stand.     Time  3    Period  Weeks    Status  New      PT SHORT TERM GOAL #4   Title  Patient will be able to perform SLS on Rt LE for 10 seconds or greater to improve balance for increased safety with stairs and gait.    Time  3    Period  Weeks    Status  New      PT SHORT TERM GOAL #5   Title  Patient will improve MMT by 1/2 grade to demonstrate increased strnehgt for more normalized gait and mobility to return to PLOF and work.         PT Long Term Goals - 02/24/18  1217      PT LONG TERM GOAL #1   Title  AROM of Rt knee extension and flexion will improve to 0-110 degrees for more to allow more normalized gait.    Time  6    Period  Weeks    Status  New    Target Date  04/07/18      PT LONG TERM GOAL #2   Title  Patient will be abel to perform SLS on Rt LE for 10 seconds or greater to improve balance for increased safety with stairs and gait.    Time  6    Period  Weeks    Status  New      PT LONG TERM GOAL #3   Title  Patient will ambulate during at 1.2 m/s with normalized gait pattern to demonstrate decresaed risk of falling and improved community ambulation to return to [facility] and work.     Time  6    Period  Weeks    Status  New      PT LONG TERM GOAL #4   Title  Patient will ascend/descend 12x 6" stairs wtih step over step pattern and 1 hand rail or less to demonstrate safety and improved independence with mobility to return to PLOF.     Time  6    Period  Weeks    Status  New      PT LONG TERM GOAL #5   Title  Patient will improve MMT by 1 grade to demonstrate increased strnehgt for more normalized gait and mobility to return to PLOF and work.     Time  6    Period  Weeks    Status  New            Plan - 02/27/18 1550    Clinical Impression Statement  Continued with low level activation of quads hamstrings, gentle ROM and stretching. ROM remains limited but patient is hopeful to get back on  motorcyle soon. ROM 23-92 degrees.     Rehab Potential  Fair    PT Frequency  3x / week    PT Duration  6 weeks    PT Treatment/Interventions  ADLs/Self Care Home Management;Aquatic Therapy;Cryotherapy;Electrical Stimulation;DME Instruction;Gait training;Stair training;Functional mobility training;Therapeutic activities;Therapeutic exercise;Balance training;Neuromuscular re-education;Patient/family education;Manual techniques;Scar mobilization;Passive range of motion;Energy conservation;Taping    PT Home Exercise Plan  Eval: quad set,  seated AAROM for knee flexion; 02/25/18 - SAQ;     Consulted and Agree with Plan of Care  Patient       Patient will benefit from skilled therapeutic intervention in order to improve the following deficits and impairments:  Abnormal gait, Decreased skin integrity, Increased fascial restricitons, Pain, Decreased scar mobility, Decreased mobility, Decreased activity tolerance, Decreased endurance, Decreased range of motion, Decreased strength, Hypomobility, Impaired flexibility, Difficulty walking, Decreased balance  Visit Diagnosis: Right knee pain, unspecified chronicity  Stiffness of right knee, not elsewhere classified  Other abnormalities of gait and mobility  Muscle weakness (generalized)     Problem List Patient Active Problem List   Diagnosis Date Noted  . S/P total knee replacement, right 02/03/18    4:03 PM, 02/27/18 Rosamaria LintsAllan C Chrisanne Loose, PT, DPT [facility] [phone] (office)      Rosamaria LintsBuccola,Nicholes Hibler C 02/27/2018, 4:02 PM  [facility] [address] [place], [place], [address] Phone: [phone]   Fax:  [phone]  Name: Melvin Turner MRN: [id number] Date of Birth: [birth year]

## 2018-03-02 ENCOUNTER — Encounter (HOSPITAL_COMMUNITY): Payer: Self-pay

## 2018-03-02 ENCOUNTER — Other Ambulatory Visit: Payer: Self-pay

## 2018-03-02 ENCOUNTER — Ambulatory Visit (HOSPITAL_COMMUNITY): Payer: No Typology Code available for payment source

## 2018-03-02 DIAGNOSIS — M25661 Stiffness of right knee, not elsewhere classified: Secondary | ICD-10-CM

## 2018-03-02 DIAGNOSIS — M25561 Pain in right knee: Secondary | ICD-10-CM | POA: Diagnosis not present

## 2018-03-02 DIAGNOSIS — R2689 Other abnormalities of gait and mobility: Secondary | ICD-10-CM

## 2018-03-02 DIAGNOSIS — M6281 Muscle weakness (generalized): Secondary | ICD-10-CM

## 2018-03-02 NOTE — Therapy (Signed)
[place] [facility] [address] [place], [place], [address] Phone: [phone]   Fax:  [phone]  Physical Therapy Treatment  Patient Details  Name: Melvin Turner MRN: [id number] Date of Birth: February 08, 1956 Referring Provider: Fuller CanadaHarrison, Stanley, MD   Encounter Date: 03/02/2018  PT End of Session - 03/02/18 1441    Visit Number  4    Number of Visits  19    Date for PT Re-Evaluation  04/07/18    Authorization Type  [employer] MULTIPLAN    Authorization Time Period  02/24/2018 - 04/07/2018 (reAssessment on    Authorization - Visit Number  4    Authorization - Number of Visits  10    PT Start Time  1434    PT Stop Time  1517 additional 12-15 minutes for ice at EOS non-billable)    PT Time Calculation (min)  43 min    Activity Tolerance  Patient tolerated treatment well    Behavior During Therapy  [facility] for tasks assessed/performed       Past Medical History:  Diagnosis Date  . Arthritis   . BPH (benign prostatic hyperplasia)   . GERD (gastroesophageal reflux disease)   . Hypertension     Past Surgical History:  Procedure Laterality Date  . COLONOSCOPY    . KNEE ARTHROSCOPY Right 01/31/2015   Procedure: RIGHT KNEE ARTHROSCOPY, PARTIAL MEDIAL MENISECTOMY;  Surgeon: Darreld McleanWayne Keeling, MD;  Location: [facility];  Service: Orthopedics;  Laterality: Right;  . TONSILLECTOMY    . TOTAL KNEE ARTHROPLASTY Right 02/03/2018   Procedure: TOTAL KNEE ARTHROPLASTY;  Surgeon: Vickki HearingHarrison, Stanley E, MD;  Location: [facility];  Service: Orthopedics;  Laterality: Right;  Marland Kitchen. VASECTOMY      There were no vitals filed for this visit.  Subjective Assessment - 03/02/18 1437    Subjective  Patient arrives doing well today. He states he already iced once today. He reports he was looking at his bike today and is hoping he can get back to riding soon.    Limitations  Sitting;Standing;Walking;Lifting;House hold activities    Patient Stated Goals  improve motion and return to work     Currently in Pain?  Yes    Pain Score  5     Pain Location  Knee    Pain Orientation  Right    Pain Descriptors / Indicators  Aching toothache    Pain Type  Surgical pain    Pain Onset  1 to 4 weeks ago    Pain Frequency  Constant       [facility] Adult PT Treatment/Exercise - 03/02/18 0001      Knee/Hip Exercises: Stretches   Active Hamstring Stretch  Right;3 reps;30 seconds;Limitations    Active Hamstring Stretch Limitations  12" box, AP pressure on anterior thight above knee for self mobilization    Knee: Self-Stretch to increase Flexion  Right;30 seconds;3 reps    Knee: Self-Stretch Limitations  12" box    Gastroc Stretch  Both;3 reps;30 seconds;Limitations    Gastroc Stretch Limitations  slant board      Knee/Hip Exercises: Standing   Knee Flexion  AROM;AAROM;Right;1 set;10 reps;Limitations    Knee Flexion Limitations  10x 10 second holds with AP glide, grade III to faciliate flexion ROM with knee drive on 12" box      Knee/Hip Exercises: Seated   Long Arc Quad  Right;1 set;15 reps      Knee/Hip Exercises: Supine   Quad Sets  20 reps;Right;AROM;2 sets  Quad Sets Limitations  3 second holds    Short Arc [employer]  2 sets;Limitations;15 reps    Short Arc Quad Sets Limitations  2 lbs    Heel Slides  AROM;Right;20 reps;2 sets    Heel Slides Limitations  5 second holds at end range    Henreitta Leber  -- attempted but withheld due to ROM limitations      Modalities   Modalities  Cryotherapy      Cryotherapy   Number Minutes Cryotherapy  12 Minutes not billed    Cryotherapy Location  Knee    Type of Cryotherapy  Ice pack      Manual Therapy   Manual Therapy  Edema management    Manual therapy comments  completed seperate from other skilled therapy    Edema Management  Retrograde massage performed with Rt LE elevated, 8 minutes. Patient instructed on on proper retrograde massage technique and to begin at the ankle and move upward towards knee and thigh. He was educated on light  pressure for retrograde massage to reduce edema as deep pressure may increase fluid present.         PT Education - 03/02/18 1440    Education provided  Yes    Education Details  Educated on exercises throughout. Updated HEP and educated patient on safety concerns wtih riding motorcycle at this time. Measured for compression garment and edcuated on where to purchase them and how to care for garment.    Person(s) Educated  Patient    Methods  Explanation;Handout    Comprehension  Verbalized understanding       PT Short Term Goals - 02/24/18 1211      PT SHORT TERM GOAL #1   Title  Patient will be independent with HEP to improve functinoal strength and ROM to return to PLOF.    Time  2    Period  Weeks    Status  New    Target Date  03/10/18      PT SHORT TERM GOAL #2   Title  AROM of Rt knee extension and flexion will improve by 8 degrees or more to allow more normalized gait.    Time  3    Period  Weeks    Status  New    Target Date  03/17/18      PT SHORT TERM GOAL #3   Title  Patient will perform 5 time sit to stand in 14 seconds with no use of UE and no evidence of momentum to initiate stand.     Time  3    Period  Weeks    Status  New      PT SHORT TERM GOAL #4   Title  Patient will be able to perform SLS on Rt LE for 10 seconds or greater to improve balance for increased safety with stairs and gait.    Time  3    Period  Weeks    Status  New      PT SHORT TERM GOAL #5   Title  Patient will improve MMT by 1/2 grade to demonstrate increased strnehgt for more normalized gait and mobility to return to PLOF and work.         PT Long Term Goals - 02/24/18 1217      PT LONG TERM GOAL #1   Title  AROM of Rt knee extension and flexion will improve to 0-110 degrees for more to allow more normalized gait.    Time  6    Period  Weeks    Status  New    Target Date  04/07/18      PT LONG TERM GOAL #2   Title  Patient will be abel to perform SLS on Rt LE for 10  seconds or greater to improve balance for increased safety with stairs and gait.    Time  6    Period  Weeks    Status  New      PT LONG TERM GOAL #3   Title  Patient will ambulate during at 1.2 m/s with normalized gait pattern to demonstrate decresaed risk of falling and improved community ambulation to return to [facility] and work.     Time  6    Period  Weeks    Status  New      PT LONG TERM GOAL #4   Title  Patient will ascend/descend 12x 6" stairs wtih step over step pattern and 1 hand rail or less to demonstrate safety and improved independence with mobility to return to PLOF.     Time  6    Period  Weeks    Status  New      PT LONG TERM GOAL #5   Title  Patient will improve MMT by 1 grade to demonstrate increased strnehgt for more normalized gait and mobility to return to PLOF and work.     Time  6    Period  Weeks    Status  New        Plan - 03/02/18 1441    Clinical Impression Statement  Continued this session with stretching and ROM exercises to address ongoing limitations. Patient continues to present with edema and was measured for compression garment this session and educated on how to acquire a garment to address the swelling. Mobilization with movement was initiated this session and increased the patient's pain, ice was applied at EOS for 12 minutes to relieve pain. He required review of proper ice/elevation technique to manage edema at home. He will benefit from continued use of Biodex to manage ROM limitations as patient reports decreased pain and stiffness following this intervention. He will benefit from skilled PT services to address impairments and progress towards goals to improve QOL and return to PLOF.    Rehab Potential  Fair    PT Frequency  3x / week    PT Duration  6 weeks    PT Treatment/Interventions  ADLs/Self Care Home Management;Aquatic Therapy;Cryotherapy;Electrical Stimulation;DME Instruction;Gait training;Stair training;Functional mobility  training;Therapeutic activities;Therapeutic exercise;Balance training;Neuromuscular re-education;Patient/family education;Manual techniques;Scar mobilization;Passive range of motion;Energy conservation;Taping    PT Next Visit Plan  Follow up on compression garment. Continue with gentle ROM techniques and exercises. Contineu with Biodex for PROM< and progress towards bike for AAROM.    PT Home Exercise Plan  Eval: quad set, seated AAROM for knee flexion; 02/25/18 - SAQ;    Consulted and Agree with Plan of Care  Patient       Patient will benefit from skilled therapeutic intervention in order to improve the following deficits and impairments:  Abnormal gait, Decreased skin integrity, Increased fascial restricitons, Pain, Decreased scar mobility, Decreased mobility, Decreased activity tolerance, Decreased endurance, Decreased range of motion, Decreased strength, Hypomobility, Impaired flexibility, Difficulty walking, Decreased balance  Visit Diagnosis: Right knee pain, unspecified chronicity  Stiffness of right knee, not elsewhere classified  Other abnormalities of gait and mobility  Muscle weakness (generalized)     Problem List Patient Active Problem List  Diagnosis Date Noted  . S/P total knee replacement, right 02/03/18     Melvin Turner, PT, DPT Physical Therapist with [facility]  03/02/2018 4:14 PM     [facility] [address] [place], [place], [address] Phone: [phone]   Fax:  [phone]  Name: ALANTE TOLAN MRN: [id number] Date of Birth: Dec 25, 1955

## 2018-03-03 ENCOUNTER — Other Ambulatory Visit: Payer: Self-pay | Admitting: Orthopedic Surgery

## 2018-03-03 DIAGNOSIS — Z96651 Presence of right artificial knee joint: Secondary | ICD-10-CM

## 2018-03-03 MED ORDER — HYDROCODONE-ACETAMINOPHEN 7.5-325 MG PO TABS: 1.0000 | ORAL_TABLET | Freq: Three times a day (TID) | ORAL | 0 refills | Status: DC | PRN

## 2018-03-03 NOTE — Telephone Encounter (Signed)
done

## 2018-03-03 NOTE — Telephone Encounter (Signed)
Hydrocodone-Acetaminophen  10/325 mg Qty 42 Tablets  Take 1 tablet by mouth every 4 (four) hours as needed.  [facility]

## 2018-03-03 NOTE — Progress Notes (Signed)
norco

## 2018-03-04 ENCOUNTER — Other Ambulatory Visit: Payer: Self-pay

## 2018-03-04 ENCOUNTER — Ambulatory Visit (HOSPITAL_COMMUNITY): Payer: No Typology Code available for payment source

## 2018-03-04 ENCOUNTER — Encounter (HOSPITAL_COMMUNITY): Payer: Self-pay

## 2018-03-04 DIAGNOSIS — M25661 Stiffness of right knee, not elsewhere classified: Secondary | ICD-10-CM

## 2018-03-04 DIAGNOSIS — R2689 Other abnormalities of gait and mobility: Secondary | ICD-10-CM

## 2018-03-04 DIAGNOSIS — M25561 Pain in right knee: Secondary | ICD-10-CM | POA: Diagnosis not present

## 2018-03-04 DIAGNOSIS — M6281 Muscle weakness (generalized): Secondary | ICD-10-CM

## 2018-03-04 NOTE — Therapy (Signed)
[facility] [address] [place], [place], [address] Phone: [phone]   Fax:  [phone]  Physical Therapy Treatment  Patient Details  Name: Melvin Turner MRN: [id number] Date of Birth: 03-23-1956 Referring Provider: Fuller Canada, MD   Encounter Date: 03/04/2018  PT End of Session - 03/04/18 1128    Visit Number  5    Number of Visits  19    Date for PT Re-Evaluation  04/07/18    Authorization Type  [employer] MULTIPLAN    Authorization Time Period  02/24/2018 - 04/07/2018 (reAssessment on    Authorization - Visit Number  5    Authorization - Number of Visits  10    PT Start Time  1121    PT Stop Time  1200    PT Time Calculation (min)  39 min    Activity Tolerance  Patient tolerated treatment well    Behavior During Therapy  [facility] for tasks assessed/performed       Past Medical History:  Diagnosis Date  . Arthritis   . BPH (benign prostatic hyperplasia)   . GERD (gastroesophageal reflux disease)   . Hypertension     Past Surgical History:  Procedure Laterality Date  . COLONOSCOPY    . KNEE ARTHROSCOPY Right 01/31/2015   Procedure: RIGHT KNEE ARTHROSCOPY, PARTIAL MEDIAL MENISECTOMY;  Surgeon: Darreld Mclean, MD;  Location: [facility];  Service: Orthopedics;  Laterality: Right;  . TONSILLECTOMY    . TOTAL KNEE ARTHROPLASTY Right 02/03/2018   Procedure: TOTAL KNEE ARTHROPLASTY;  Surgeon: Vickki Hearing, MD;  Location: [facility];  Service: Orthopedics;  Laterality: Right;  Marland Kitchen VASECTOMY      There were no vitals filed for this visit.  Subjective Assessment - 03/04/18 1124    Subjective  Patient is doing well today and states he was able to get in touch with the compression garment store in [place]. He states they will be shipped here by Friday or Saturday. He also had a tough morning taking his dog to the vet because he has been sick. He states he is doing his exercises "working them in, piddling around". He states he is mixing  exercises in and no following his HEP exactly. He states he doesn't want to go inside when it is nice out and he gets sidetracked outside.      Limitations  Sitting;Standing;Walking;Lifting;House hold activities    Patient Stated Goals  improve motion and return to work    Currently in Pain?  Yes    Pain Score  5     Pain Location  Knee    Pain Orientation  Right    Pain Descriptors / Indicators  Aching toothache    Pain Onset  1 to 4 weeks ago    Pain Frequency  Constant    Aggravating Factors   bending and straightening    Pain Relieving Factors  ice, medicine       [facility] PT Assessment - 03/04/18 0001      AROM   Right Knee Extension  12    Right Knee Flexion  90       [facility] Adult PT Treatment/Exercise - 03/04/18 0001      Knee/Hip Exercises: Stretches   Active Hamstring Stretch  Right;3 reps;30 seconds;Limitations    Active Hamstring Stretch Limitations  12" box, AP pressure on anterior thight above knee for self mobilization    Knee: Self-Stretch to increase Flexion  Right;30 seconds;3 reps    Knee: Self-Stretch  Limitations  12" box    Gastroc Stretch  Both;3 reps;30 seconds;Limitations    Gastroc Stretch Limitations  slant board      Knee/Hip Exercises: Standing   Terminal Knee Extension  AROM;Strengthening;Right;2 sets;15 reps;Theraband    Theraband Level (Terminal Knee Extension)  Level 4 (Blue)    Step Down  Right;2 sets;10 reps;Hand Hold: 2;Step Height: 2"    Rocker Board  2 minutes 2x 1 minute lateral shift      Knee/Hip Exercises: Supine   Short Arc Quad Sets  2 sets;Limitations;15 reps    Short Arc Quad Sets Limitations  3 lbs      Knee/Hip Exercises: Prone   Hamstring Curl  2 sets;15 reps;Limitations    Hamstring Curl Limitations  blue theraband        PT Education - 03/04/18 1144    Education provided  Yes    Education Details  Educated on exercise throughout and updated HEP with knee flexion/extension stretch on step.    Person(s) Educated  Patient     Methods  Explanation;Handout    Comprehension  Verbalized understanding       PT Short Term Goals - 02/24/18 1211      PT SHORT TERM GOAL #1   Title  Patient will be independent with HEP to improve functinoal strength and ROM to return to PLOF.    Time  2    Period  Weeks    Status  New    Target Date  03/10/18      PT SHORT TERM GOAL #2   Title  AROM of Rt knee extension and flexion will improve by 8 degrees or more to allow more normalized gait.    Time  3    Period  Weeks    Status  New    Target Date  03/17/18      PT SHORT TERM GOAL #3   Title  Patient will perform 5 time sit to stand in 14 seconds with no use of UE and no evidence of momentum to initiate stand.     Time  3    Period  Weeks    Status  New      PT SHORT TERM GOAL #4   Title  Patient will be able to perform SLS on Rt LE for 10 seconds or greater to improve balance for increased safety with stairs and gait.    Time  3    Period  Weeks    Status  New      PT SHORT TERM GOAL #5   Title  Patient will improve MMT by 1/2 grade to demonstrate increased strnehgt for more normalized gait and mobility to return to PLOF and work.         PT Long Term Goals - 02/24/18 1217      PT LONG TERM GOAL #1   Title  AROM of Rt knee extension and flexion will improve to 0-110 degrees for more to allow more normalized gait.    Time  6    Period  Weeks    Status  New    Target Date  04/07/18      PT LONG TERM GOAL #2   Title  Patient will be abel to perform SLS on Rt LE for 10 seconds or greater to improve balance for increased safety with stairs and gait.    Time  6    Period  Weeks    Status  New  PT LONG TERM GOAL #3   Title  Patient will ambulate during at 1.2 m/s with normalized gait pattern to demonstrate decresaed risk of falling and improved community ambulation to return to [facility] and work.     Time  6    Period  Weeks    Status  New      PT LONG TERM GOAL #4   Title  Patient will  ascend/descend 12x 6" stairs wtih step over step pattern and 1 hand rail or less to demonstrate safety and improved independence with mobility to return to PLOF.     Time  6    Period  Weeks    Status  New      PT LONG TERM GOAL #5   Title  Patient will improve MMT by 1 grade to demonstrate increased strnehgt for more normalized gait and mobility to return to PLOF and work.     Time  6    Period  Weeks    Status  New        Plan - 03/04/18 1128    Clinical Impression Statement  Patient is progressing well in therapy and has advanced exercises for Rom and strength this session with no increase in pain. Patient continues to present with edema and has ordered his compression garments for thigh high compression, he states they should arrive by Saturday this week. He was able to perform TKE with blue theraband and initiated hamstring curls this session for strengthening and ROM. AROM for Rt knee has improved this session to 12-90 degrees. He will benefit from skilled PT services to address impairments and progress towards goals to improve QOL and return to PLOF.    Rehab Potential  Fair    PT Frequency  3x / week    PT Duration  6 weeks    PT Treatment/Interventions  ADLs/Self Care Home Management;Aquatic Therapy;Cryotherapy;Electrical Stimulation;DME Instruction;Gait training;Stair training;Functional mobility training;Therapeutic activities;Therapeutic exercise;Balance training;Neuromuscular re-education;Patient/family education;Manual techniques;Scar mobilization;Passive range of motion;Energy conservation;Taping    PT Next Visit Plan  Continue with ROM techniques and exercises. Continue with Biodex for PROM< and progress towards bike for AAROM. Advance quad and hamstring strengthening as patient is ready.    PT Home Exercise Plan  Eval: quad set, seated AAROM for knee flexion; 02/25/18 - SAQ; 03/04/18 - knee flexino stretch on step, hamstring stretch on step;     Consulted and Agree with Plan of  Care  Patient       Patient will benefit from skilled therapeutic intervention in order to improve the following deficits and impairments:  Abnormal gait, Decreased skin integrity, Increased fascial restricitons, Pain, Decreased scar mobility, Decreased mobility, Decreased activity tolerance, Decreased endurance, Decreased range of motion, Decreased strength, Hypomobility, Impaired flexibility, Difficulty walking, Decreased balance  Visit Diagnosis: Right knee pain, unspecified chronicity  Stiffness of right knee, not elsewhere classified  Other abnormalities of gait and mobility  Muscle weakness (generalized)     Problem List Patient Active Problem List   Diagnosis Date Noted  . S/P total knee replacement, right 02/03/18     Valentino Saxon, PT, DPT Physical Therapist with [facility]  03/04/2018 12:02 PM    [facility] [address] [place], [place], [address] Phone: [phone]   Fax:  [phone]  Name: Melvin Turner MRN: [id number] Date of Birth: [birth year]

## 2018-03-04 NOTE — Patient Instructions (Addendum)
   STANDING HAMSTRING STRETCH - PROPPED: 3-5 times 30 seconds each  Start by standing and prop your foot of the affected leg on a chair or a step.   Next, slowly lean forward until a stretch is felt behind your knee/thigh. Bend through your hips and not your spine. Hold, then return to starting position and repeat.      Knee Flexion Stretch on Step: 3-5 times for 30 seconds each  Place foot on step and lean forward until you feel a good stretch in front of knee.

## 2018-03-06 ENCOUNTER — Encounter (HOSPITAL_COMMUNITY): Payer: Self-pay | Admitting: Physical Therapy

## 2018-03-06 ENCOUNTER — Ambulatory Visit (HOSPITAL_COMMUNITY): Payer: No Typology Code available for payment source | Admitting: Physical Therapy

## 2018-03-06 DIAGNOSIS — M25661 Stiffness of right knee, not elsewhere classified: Secondary | ICD-10-CM

## 2018-03-06 DIAGNOSIS — M25561 Pain in right knee: Secondary | ICD-10-CM

## 2018-03-06 DIAGNOSIS — M6281 Muscle weakness (generalized): Secondary | ICD-10-CM

## 2018-03-06 DIAGNOSIS — R2689 Other abnormalities of gait and mobility: Secondary | ICD-10-CM

## 2018-03-06 NOTE — Therapy (Signed)
[facility] [address] [place], [place], [address] Phone: [phone]   Fax:  [phone]  Physical Therapy Treatment  Patient Details  Name: Melvin Turner MRN: [id number] Date of Birth: [birth year] Referring Provider: Fuller Canada, MD   Encounter Date: 03/06/2018  PT End of Session - 03/06/18 0840    Visit Number  6    Number of Visits  19    Date for PT Re-Evaluation  04/07/18    Authorization Type  [employer] MULTIPLAN    Authorization Time Period  02/24/2018 - 04/07/2018 (reAssessment on 03/17/18)    Authorization - Visit Number  6    Authorization - Number of Visits  10    PT Start Time  0818    PT Stop Time  0856    PT Time Calculation (min)  38 min    Activity Tolerance  Patient tolerated treatment well    Behavior During Therapy  [facility] for tasks assessed/performed       Past Medical History:  Diagnosis Date  . Arthritis   . BPH (benign prostatic hyperplasia)   . GERD (gastroesophageal reflux disease)   . Hypertension     Past Surgical History:  Procedure Laterality Date  . COLONOSCOPY    . KNEE ARTHROSCOPY Right 01/31/2015   Procedure: RIGHT KNEE ARTHROSCOPY, PARTIAL MEDIAL MENISECTOMY;  Surgeon: Darreld Mclean, MD;  Location: [facility];  Service: Orthopedics;  Laterality: Right;  . TONSILLECTOMY    . TOTAL KNEE ARTHROPLASTY Right 02/03/2018   Procedure: TOTAL KNEE ARTHROPLASTY;  Surgeon: Vickki Hearing, MD;  Location: [facility];  Service: Orthopedics;  Laterality: Right;  Marland Kitchen VASECTOMY      There were no vitals filed for this visit.  Subjective Assessment - 03/06/18 0821    Subjective  Patient stated today is not a good day due to the pain in his knees. He said he was working a lot yesterday in the yard and it aggravated his knee.     Limitations  Sitting;Standing;Walking;Lifting;House hold activities    Patient Stated Goals  improve motion and return to work    Currently in Pain?  Yes    Pain Score  8     Pain Location   Knee    Pain Orientation  Right    Pain Descriptors / Indicators  Aching    Pain Type  Surgical pain    Pain Onset  1 to 4 weeks ago    Pain Frequency  Constant                       [facility] Adult PT Treatment/Exercise - 03/06/18 0001      Knee/Hip Exercises: Stretches   Active Hamstring Stretch  Right;3 reps;30 seconds;Limitations    Active Hamstring Stretch Limitations  12" box    Knee: Self-Stretch to increase Flexion  Right;30 seconds;3 reps    Knee: Self-Stretch Limitations  12" box    Gastroc Stretch  Both;3 reps;30 seconds;Limitations    Gastroc Stretch Limitations  slant board      Knee/Hip Exercises: Standing   Terminal Knee Extension  AROM;Strengthening;Right;Theraband;2 sets;10 reps;Other (comment) 10 second holds    Theraband Level (Terminal Knee Extension)  Level 4 (Blue)    Step Down  Right;2 sets;10 reps;Hand Hold: 2;Step Height: 4"    Rocker Board  2 minutes Left and right      Knee/Hip Exercises: Seated   Other Seated Knee/Hip Exercises  Biodex for flexion/extension: 10 minutes for  PROM and end range stretching. Progressed from 75% to 95% into extension and from 75%  to 87% of flexion ROM. Patient reporting greater discomfort bending knee than straightening.      Knee/Hip Exercises: Supine   Short Arc Quad Sets  2 sets;Limitations;15 reps    Short Arc Quad Sets Limitations  3 lbs      Knee/Hip Exercises: Prone   Hamstring Curl  2 sets;15 reps;Limitations    Hamstring Curl Limitations  2# ankle weight             PT Education - 03/06/18 0836    Education provided  Yes    Education Details  Educated on purpose and technique of exercises throughout session and discussed importance of contininuing home exercises.     Person(s) Educated  Patient    Methods  Explanation;Demonstration;Verbal cues    Comprehension  Verbalized understanding;Returned demonstration       PT Short Term Goals - 02/24/18 1211      PT SHORT TERM GOAL #1   Title   Patient will be independent with HEP to improve functinoal strength and ROM to return to PLOF.    Time  2    Period  Weeks    Status  New    Target Date  03/10/18      PT SHORT TERM GOAL #2   Title  AROM of Rt knee extension and flexion will improve by 8 degrees or more to allow more normalized gait.    Time  3    Period  Weeks    Status  New    Target Date  03/17/18      PT SHORT TERM GOAL #3   Title  Patient will perform 5 time sit to stand in 14 seconds with no use of UE and no evidence of momentum to initiate stand.     Time  3    Period  Weeks    Status  New      PT SHORT TERM GOAL #4   Title  Patient will be able to perform SLS on Rt LE for 10 seconds or greater to improve balance for increased safety with stairs and gait.    Time  3    Period  Weeks    Status  New      PT SHORT TERM GOAL #5   Title  Patient will improve MMT by 1/2 grade to demonstrate increased strnehgt for more normalized gait and mobility to return to PLOF and work.         PT Long Term Goals - 02/24/18 1217      PT LONG TERM GOAL #1   Title  AROM of Rt knee extension and flexion will improve to 0-110 degrees for more to allow more normalized gait.    Time  6    Period  Weeks    Status  New    Target Date  04/07/18      PT LONG TERM GOAL #2   Title  Patient will be abel to perform SLS on Rt LE for 10 seconds or greater to improve balance for increased safety with stairs and gait.    Time  6    Period  Weeks    Status  New      PT LONG TERM GOAL #3   Title  Patient will ambulate during at 1.2 m/s with normalized gait pattern to demonstrate decresaed risk of falling and improved community ambulation to return to  PLOF and work.     Time  6    Period  Weeks    Status  New      PT LONG TERM GOAL #4   Title  Patient will ascend/descend 12x 6" stairs wtih step over step pattern and 1 hand rail or less to demonstrate safety and improved independence with mobility to return to PLOF.     Time   6    Period  Weeks    Status  New      PT LONG TERM GOAL #5   Title  Patient will improve MMT by 1 grade to demonstrate increased strnehgt for more normalized gait and mobility to return to PLOF and work.     Time  6    Period  Weeks    Status  New            Plan - 03/06/18 0857    Clinical Impression Statement  This session continued to focus on improving right knee range of motion. Patient reported having a blister from the adhesive tape. Patient was instructed to leave the blister alone and discuss it with his physician. This session patient was able to tolerate up to 95% of knee extension and 87% knee flexion. Discussed with patient the importance of continuing his home exercises. Patient performed hamstring curls with 2 pound weight this session. Patient was able to perform step downs from a 4 inch step this session. Patient would benefit from continued physical therapy to progress patient with range of motion exercises, strengthening and overall functional mobility.     Rehab Potential  Fair    PT Frequency  3x / week    PT Duration  6 weeks    PT Treatment/Interventions  ADLs/Self Care Home Management;Aquatic Therapy;Cryotherapy;Electrical Stimulation;DME Instruction;Gait training;Stair training;Functional mobility training;Therapeutic activities;Therapeutic exercise;Balance training;Neuromuscular re-education;Patient/family education;Manual techniques;Scar mobilization;Passive range of motion;Energy conservation;Taping    PT Next Visit Plan  Continue with ROM techniques and exercises. Continue with Biodex for PROM< and progress towards bike for AAROM. Advance quad and hamstring strengthening as patient is ready.    PT Home Exercise Plan  Eval: quad set, seated AAROM for knee flexion; 02/25/18 - SAQ; 03/04/18 - knee flexino stretch on step, hamstring stretch on step;     Consulted and Agree with Plan of Care  Patient       Patient will benefit from skilled therapeutic intervention  in order to improve the following deficits and impairments:  Abnormal gait, Decreased skin integrity, Increased fascial restricitons, Pain, Decreased scar mobility, Decreased mobility, Decreased activity tolerance, Decreased endurance, Decreased range of motion, Decreased strength, Hypomobility, Impaired flexibility, Difficulty walking, Decreased balance  Visit Diagnosis: Right knee pain, unspecified chronicity  Stiffness of right knee, not elsewhere classified  Other abnormalities of gait and mobility  Muscle weakness (generalized)     Problem List Patient Active Problem List   Diagnosis Date Noted  . 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

## 2018-03-09 ENCOUNTER — Other Ambulatory Visit: Payer: Self-pay

## 2018-03-09 ENCOUNTER — Encounter (HOSPITAL_COMMUNITY): Payer: Self-pay

## 2018-03-09 ENCOUNTER — Ambulatory Visit (HOSPITAL_COMMUNITY): Payer: No Typology Code available for payment source

## 2018-03-09 DIAGNOSIS — M25561 Pain in right knee: Secondary | ICD-10-CM

## 2018-03-09 DIAGNOSIS — M6281 Muscle weakness (generalized): Secondary | ICD-10-CM

## 2018-03-09 DIAGNOSIS — R2689 Other abnormalities of gait and mobility: Secondary | ICD-10-CM

## 2018-03-09 DIAGNOSIS — M25661 Stiffness of right knee, not elsewhere classified: Secondary | ICD-10-CM

## 2018-03-09 NOTE — Therapy (Signed)
[facility] [address] [place], [place], [address] Phone: [phone]   Fax:  [phone]  Physical Therapy Treatment  Patient Details  Name: Melvin Turner MRN: [id number] Date of Birth: 03-22-1956 Referring Provider: Fuller Canada, MD   Encounter Date: 03/09/2018  PT End of Session - 03/09/18 1443    Visit Number  7    Number of Visits  19    Date for PT Re-Evaluation  04/07/18    Authorization Type  [employer] MULTIPLAN    Authorization Time Period  02/24/2018 - 04/07/2018 (reAssessment on 03/17/18)    Authorization - Visit Number  7    Authorization - Number of Visits  10    PT Start Time  1439    PT Stop Time  1520    PT Time Calculation (min)  41 min    Activity Tolerance  Patient tolerated treatment well    Behavior During Therapy  [facility] for tasks assessed/performed       Past Medical History:  Diagnosis Date  . Arthritis   . BPH (benign prostatic hyperplasia)   . GERD (gastroesophageal reflux disease)   . Hypertension     Past Surgical History:  Procedure Laterality Date  . COLONOSCOPY    . KNEE ARTHROSCOPY Right 01/31/2015   Procedure: RIGHT KNEE ARTHROSCOPY, PARTIAL MEDIAL MENISECTOMY;  Surgeon: Darreld Mclean, MD;  Location: [facility];  Service: Orthopedics;  Laterality: Right;  . TONSILLECTOMY    . TOTAL KNEE ARTHROPLASTY Right 02/03/2018   Procedure: TOTAL KNEE ARTHROPLASTY;  Surgeon: Vickki Hearing, MD;  Location: [facility];  Service: Orthopedics;  Laterality: Right;  Marland Kitchen VASECTOMY      There were no vitals filed for this visit.  Subjective Assessment - 03/09/18 1441    Subjective  Patient states he has been doing his exercises daily and often does that outside if the weather is nice. He arrived wearing his compression garment today and reports it helps keep his knee from feeling as stiff throughout the day. He states the blister where his bandage was is still irritated and he has been putting neosporen on it and coco  butter on his scar which seem to be helping.    Limitations  Sitting;Standing;Walking;Lifting;House hold activities    Patient Stated Goals  improve motion and return to work    Currently in Pain?  Yes    Pain Score  5     Pain Location  Knee    Pain Orientation  Right    Pain Descriptors / Indicators  Aching toothache    Pain Type  Surgical pain    Pain Onset  More than a month ago    Pain Frequency  Constant       [facility] Adult PT Treatment/Exercise - 03/09/18 0001      Knee/Hip Exercises: Stretches   Active Hamstring Stretch  Right;3 reps;30 seconds;Limitations    Active Hamstring Stretch Limitations  12" box; AP glide on femur above knee joint    Knee: Self-Stretch to increase Flexion  Right;30 seconds;3 reps    Knee: Self-Stretch Limitations  12" box    Gastroc Stretch  Both;3 reps;30 seconds;Limitations    Gastroc Stretch Limitations  slant board    Other Knee/Hip Stretches  Sustained knee extension stretch in supine: 7.5 lbs on anterior knee for 3 minutes      Knee/Hip Exercises: Aerobic   Stationary Bike  4 minutes on seat 15, half revolutions      Knee/Hip Exercises:  Standing   Terminal Knee Extension  AROM;Strengthening;Right;Theraband;2 sets;Other (comment);15 reps    Theraband Level (Terminal Knee Extension)  Level 4 (Blue)    Lateral Step Up  Right;1 set;15 reps;Step Height: 6";Hand Hold: 1    Forward Step Up  Right;1 set;15 reps;Step Height: 6";Hand Hold: 1    Step Down  Right;2 sets;10 reps;Hand Hold: 2;Step Height: 4"      Knee/Hip Exercises: Supine   Short Arc Quad Sets  2 sets;Limitations;15 reps    Short Arc Quad Sets Limitations  3#      Knee/Hip Exercises: Prone   Hamstring Curl  2 sets;15 reps;Limitations    Hamstring Curl Limitations  3#       PT Education - 03/09/18 1443    Education provided  Yes    Person(s) Educated  Patient    Methods  Explanation    Comprehension  Verbalized understanding       PT Short Term Goals - 02/24/18 1211       PT SHORT TERM GOAL #1   Title  Patient will be independent with HEP to improve functinoal strength and ROM to return to PLOF.    Time  2    Period  Weeks    Status  New    Target Date  03/10/18      PT SHORT TERM GOAL #2   Title  AROM of Rt knee extension and flexion will improve by 8 degrees or more to allow more normalized gait.    Time  3    Period  Weeks    Status  New    Target Date  03/17/18      PT SHORT TERM GOAL #3   Title  Patient will perform 5 time sit to stand in 14 seconds with no use of UE and no evidence of momentum to initiate stand.     Time  3    Period  Weeks    Status  New      PT SHORT TERM GOAL #4   Title  Patient will be able to perform SLS on Rt LE for 10 seconds or greater to improve balance for increased safety with stairs and gait.    Time  3    Period  Weeks    Status  New      PT SHORT TERM GOAL #5   Title  Patient will improve MMT by 1/2 grade to demonstrate increased strnehgt for more normalized gait and mobility to return to PLOF and work.         PT Long Term Goals - 02/24/18 1217      PT LONG TERM GOAL #1   Title  AROM of Rt knee extension and flexion will improve to 0-110 degrees for more to allow more normalized gait.    Time  6    Period  Weeks    Status  New    Target Date  04/07/18      PT LONG TERM GOAL #2   Title  Patient will be abel to perform SLS on Rt LE for 10 seconds or greater to improve balance for increased safety with stairs and gait.    Time  6    Period  Weeks    Status  New      PT LONG TERM GOAL #3   Title  Patient will ambulate during at 1.2 m/s with normalized gait pattern to demonstrate decresaed risk of falling and improved community ambulation to return  to PLOF and work.     Time  6    Period  Weeks    Status  New      PT LONG TERM GOAL #4   Title  Patient will ascend/descend 12x 6" stairs wtih step over step pattern and 1 hand rail or less to demonstrate safety and improved independence with  mobility to return to PLOF.     Time  6    Period  Weeks    Status  New      PT LONG TERM GOAL #5   Title  Patient will improve MMT by 1 grade to demonstrate increased strnehgt for more normalized gait and mobility to return to PLOF and work.     Time  6    Period  Weeks    Status  New        Plan - 03/09/18 1444    Clinical Impression Statement  Patient is progressing gradually and this session continued to focus ROM exercises and quad strengthening. Introduced sustained stretch for knee extension this session with patient reporting no increase in pain. Continue to educate patient on precautions with riding motorcycle and that it would be unsafe at this time due to current impairments. Patient continues to require UE assist for balance and to raise up following step down exercise on 4" step. [facility] and was unable to complete full revolutions. He will continue to benefit from skilled physical therapy to progress to goals and improve functional ROM and mobility.     Rehab Potential  Fair    PT Frequency  3x / week    PT Duration  6 weeks    PT Treatment/Interventions  ADLs/Self Care Home Management;Aquatic Therapy;Cryotherapy;Electrical Stimulation;DME Instruction;Gait training;Stair training;Functional mobility training;Therapeutic activities;Therapeutic exercise;Balance training;Neuromuscular re-education;Patient/family education;Manual techniques;Scar mobilization;Passive range of motion;Energy conservation;Taping    PT Next Visit Plan  Continue with ROM techniques and exercises. [facility]. Advance quad and hamstring strengthening as patient is ready. Continue with sustained extension stretch and add to HEP next session.    PT Home Exercise Plan  Eval: quad set, seated AAROM for knee flexion; 02/25/18 - SAQ; 03/04/18 - knee flexino stretch on step, hamstring stretch on step;     Consulted and Agree with Plan of Care  Patient       Patient will  benefit from skilled therapeutic intervention in order to improve the following deficits and impairments:  Abnormal gait, Decreased skin integrity, Increased fascial restricitons, Pain, Decreased scar mobility, Decreased mobility, Decreased activity tolerance, Decreased endurance, Decreased range of motion, Decreased strength, Hypomobility, Impaired flexibility, Difficulty walking, Decreased balance  Visit Diagnosis: Right knee pain, unspecified chronicity  Stiffness of right knee, not elsewhere classified  Other abnormalities of gait and mobility  Muscle weakness (generalized)     Problem List Patient Active Problem List   Diagnosis Date Noted  . 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

## 2018-03-11 ENCOUNTER — Telehealth (HOSPITAL_COMMUNITY): Payer: Self-pay

## 2018-03-11 ENCOUNTER — Ambulatory Visit (HOSPITAL_COMMUNITY): Payer: No Typology Code available for payment source

## 2018-03-11 NOTE — Telephone Encounter (Signed)
His son died last night and his dog died this morning- He can not come in today.

## 2018-03-13 ENCOUNTER — Encounter (HOSPITAL_COMMUNITY): Payer: Self-pay | Admitting: Physical Therapy

## 2018-03-13 ENCOUNTER — Ambulatory Visit (HOSPITAL_COMMUNITY): Payer: No Typology Code available for payment source | Admitting: Physical Therapy

## 2018-03-13 ENCOUNTER — Telehealth (HOSPITAL_COMMUNITY): Payer: Self-pay

## 2018-03-13 DIAGNOSIS — R2689 Other abnormalities of gait and mobility: Secondary | ICD-10-CM

## 2018-03-13 DIAGNOSIS — M6281 Muscle weakness (generalized): Secondary | ICD-10-CM

## 2018-03-13 DIAGNOSIS — M25561 Pain in right knee: Secondary | ICD-10-CM

## 2018-03-13 DIAGNOSIS — M25661 Stiffness of right knee, not elsewhere classified: Secondary | ICD-10-CM

## 2018-03-13 NOTE — Therapy (Signed)
[facility] [address] [place], [place], [address] Phone: [phone]   Fax:  [phone]  Physical Therapy Treatment  Patient Details  Name: Melvin Turner MRN: [id number] Date of Birth: [birth year] Referring Provider: Fuller CanadaHarrison, Stanley, MD   Encounter Date: 03/13/2018  PT End of Session - 03/13/18 1147    Visit Number  8    Number of Visits  19    Date for PT Re-Evaluation  04/07/18    Authorization Type  [employer] MULTIPLAN    Authorization Time Period  02/24/2018 - 04/07/2018 (reAssessment on 03/17/18)    Authorization - Visit Number  8    Authorization - Number of Visits  10    PT Start Time  1112    PT Stop Time  1202    PT Time Calculation (min)  50 min    Activity Tolerance  Patient tolerated treatment well    Behavior During Therapy  [facility] for tasks assessed/performed       Past Medical History:  Diagnosis Date  . Arthritis   . BPH (benign prostatic hyperplasia)   . GERD (gastroesophageal reflux disease)   . Hypertension     Past Surgical History:  Procedure Laterality Date  . COLONOSCOPY    . KNEE ARTHROSCOPY Right 01/31/2015   Procedure: RIGHT KNEE ARTHROSCOPY, PARTIAL MEDIAL MENISECTOMY;  Surgeon: Darreld McleanWayne Keeling, MD;  Location: [facility];  Service: Orthopedics;  Laterality: Right;  . TONSILLECTOMY    . TOTAL KNEE ARTHROPLASTY Right 02/03/2018   Procedure: TOTAL KNEE ARTHROPLASTY;  Surgeon: Vickki HearingHarrison, Stanley E, MD;  Location: [facility];  Service: Orthopedics;  Laterality: Right;  Marland Kitchen. VASECTOMY      There were no vitals filed for this visit.  Subjective Assessment - 03/13/18 1115    Subjective  Patient stated he has not been doing his exercises at home as he has not felt up to doing anything recently. Patient reported no medical changes.     Limitations  Sitting;Standing;Walking;Lifting;House hold activities    Patient Stated Goals  improve motion and return to work    Currently in Pain?  Yes    Pain Score  4     Pain Location   Knee    Pain Orientation  Right    Pain Descriptors / Indicators  Aching;Numbness    Pain Type  Surgical pain    Pain Onset  More than a month ago    Pain Frequency  Constant    Aggravating Factors   bending and straightening     Pain Relieving Factors  ice and medicine                       [facility] Adult PT Treatment/Exercise - 03/13/18 0001      Knee/Hip Exercises: Stretches   Active Hamstring Stretch  Right;3 reps;30 seconds;Limitations    Active Hamstring Stretch Limitations  12" box; AP glide on femur above knee joint    Knee: Self-Stretch to increase Flexion  Right;30 seconds;3 reps    Knee: Self-Stretch Limitations  12" box    Gastroc Stretch  Both;3 reps;30 seconds;Limitations    Gastroc Stretch Limitations  slant board    Other Knee/Hip Stretches  Sustained knee extension stretch in supine: 7.5 lbs on anterior knee for 3 minutes      Knee/Hip Exercises: Aerobic   Stationary Bike  4 minutes on seat 15, half revolutions to improve right knee ROM      Knee/Hip Exercises: Standing  Terminal Knee Extension  AROM;Strengthening;Right;Theraband;2 sets;Other (comment);15 reps    Theraband Level (Terminal Knee Extension)  Level 4 (Blue)    Lateral Step Up  Right;1 set;15 reps;Step Height: 6";Hand Hold: 1    Forward Step Up  Right;1 set;15 reps;Step Height: 6";Hand Hold: 1    Step Down  Right;2 sets;10 reps;Step Height: 4";Hand Hold: 1    Rocker Board  2 minutes Left and right      Knee/Hip Exercises: Supine   Short Arc Quad Sets  2 sets;Limitations;15 reps    Short Arc Quad Sets Limitations  3# on foam roll    Knee Extension  AROM;Right;Limitations    Knee Extension Limitations  9    Knee Flexion  AROM;Right;Limitations    Knee Flexion Limitations  90      Knee/Hip Exercises: Prone   Hamstring Curl  2 sets;15 reps;Limitations    Hamstring Curl Limitations  3#      Manual Therapy   Manual Therapy  Joint mobilization    Manual therapy comments  completed  seperate from other skilled therapy    Joint Mobilization  Patella glides grade III superior and inferior 3 x 30 seconds.               PT Short Term Goals - 02/24/18 1211      PT SHORT TERM GOAL #1   Title  Patient will be independent with HEP to improve functinoal strength and ROM to return to PLOF.    Time  2    Period  Weeks    Status  New    Target Date  03/10/18      PT SHORT TERM GOAL #2   Title  AROM of Rt knee extension and flexion will improve by 8 degrees or more to allow more normalized gait.    Time  3    Period  Weeks    Status  New    Target Date  03/17/18      PT SHORT TERM GOAL #3   Title  Patient will perform 5 time sit to stand in 14 seconds with no use of UE and no evidence of momentum to initiate stand.     Time  3    Period  Weeks    Status  New      PT SHORT TERM GOAL #4   Title  Patient will be able to perform SLS on Rt LE for 10 seconds or greater to improve balance for increased safety with stairs and gait.    Time  3    Period  Weeks    Status  New      PT SHORT TERM GOAL #5   Title  Patient will improve MMT by 1/2 grade to demonstrate increased strnehgt for more normalized gait and mobility to return to PLOF and work.         PT Long Term Goals - 02/24/18 1217      PT LONG TERM GOAL #1   Title  AROM of Rt knee extension and flexion will improve to 0-110 degrees for more to allow more normalized gait.    Time  6    Period  Weeks    Status  New    Target Date  04/07/18      PT LONG TERM GOAL #2   Title  Patient will be abel to perform SLS on Rt LE for 10 seconds or greater to improve balance for increased safety with stairs and gait.  Time  6    Period  Weeks    Status  New      PT LONG TERM GOAL #3   Title  Patient will ambulate during at 1.2 m/s with normalized gait pattern to demonstrate decresaed risk of falling and improved community ambulation to return to [facility] and work.     Time  6    Period  Weeks    Status   New      PT LONG TERM GOAL #4   Title  Patient will ascend/descend 12x 6" stairs wtih step over step pattern and 1 hand rail or less to demonstrate safety and improved independence with mobility to return to PLOF.     Time  6    Period  Weeks    Status  New      PT LONG TERM GOAL #5   Title  Patient will improve MMT by 1 grade to demonstrate increased strnehgt for more normalized gait and mobility to return to PLOF and work.     Time  6    Period  Weeks    Status  New            Plan - 03/13/18 1209    Clinical Impression Statement  This session continued to progress patient with exercises to improve right lower extremity range of motion and functional mobility. This session patient was able to perform step downs with 1 hand hold assist. This session also added patella mobility to improve patient's knee extension and flexion active range of motion. Patient was educated to take breaks during his car trip to stretch his legs and to continue his home exercises. Patient would benefit from continued skilled physical therapy in order to continue addressing deficits in range of motion, strength, and overall functional mobility.     Rehab Potential  Fair    PT Frequency  3x / week    PT Duration  6 weeks    PT Treatment/Interventions  ADLs/Self Care Home Management;Aquatic Therapy;Cryotherapy;Electrical Stimulation;DME Instruction;Gait training;Stair training;Functional mobility training;Therapeutic activities;Therapeutic exercise;Balance training;Neuromuscular re-education;Patient/family education;Manual techniques;Scar mobilization;Passive range of motion;Energy conservation;Taping    PT Next Visit Plan  Continue with ROM techniques and exercises. Continue with bike for [facility]. Advance quad and hamstring strengthening as patient is ready. Continue with sustained extension stretch and add to HEP next session.    PT Home Exercise Plan  Eval: quad set, seated AAROM for knee flexion; 02/25/18 - SAQ;  03/04/18 - knee flexino stretch on step, hamstring stretch on step;     Consulted and Agree with Plan of Care  Patient       Patient will benefit from skilled therapeutic intervention in order to improve the following deficits and impairments:  Abnormal gait, Decreased skin integrity, Increased fascial restricitons, Pain, Decreased scar mobility, Decreased mobility, Decreased activity tolerance, Decreased endurance, Decreased range of motion, Decreased strength, Hypomobility, Impaired flexibility, Difficulty walking, Decreased balance  Visit Diagnosis: Right knee pain, unspecified chronicity  Stiffness of right knee, not elsewhere classified  Other abnormalities of gait and mobility  Muscle weakness (generalized)     Problem List Patient Active Problem List   Diagnosis Date Noted  . S/P total knee replacement, right 02/03/18     Melvin Turner PT, Melvin Turner 12:17 PM, 03/13/18 [phone]  [facility] [address] [place], [place], [address] Phone: [phone]   Fax:  [phone]  Name: Melvin Turner MRN: [id number] Date of Birth: 1956-03-23

## 2018-03-13 NOTE — Telephone Encounter (Signed)
He will be at his son's funeral. NF 03/13/18

## 2018-03-16 ENCOUNTER — Encounter (HOSPITAL_COMMUNITY): Payer: No Typology Code available for payment source

## 2018-03-18 ENCOUNTER — Ambulatory Visit (INDEPENDENT_AMBULATORY_CARE_PROVIDER_SITE_OTHER): Payer: No Typology Code available for payment source

## 2018-03-18 ENCOUNTER — Other Ambulatory Visit: Payer: Self-pay

## 2018-03-18 ENCOUNTER — Encounter: Payer: Self-pay | Admitting: Orthopedic Surgery

## 2018-03-18 ENCOUNTER — Ambulatory Visit (HOSPITAL_COMMUNITY): Payer: PRIVATE HEALTH INSURANCE | Attending: Orthopedic Surgery

## 2018-03-18 ENCOUNTER — Encounter (HOSPITAL_COMMUNITY): Payer: Self-pay

## 2018-03-18 ENCOUNTER — Ambulatory Visit (INDEPENDENT_AMBULATORY_CARE_PROVIDER_SITE_OTHER): Payer: Self-pay | Admitting: Orthopedic Surgery

## 2018-03-18 VITALS — BP 121/75 | HR 93 | Ht 70.0 in | Wt 222.0 lb

## 2018-03-18 DIAGNOSIS — M25661 Stiffness of right knee, not elsewhere classified: Secondary | ICD-10-CM | POA: Insufficient documentation

## 2018-03-18 DIAGNOSIS — R2689 Other abnormalities of gait and mobility: Secondary | ICD-10-CM | POA: Insufficient documentation

## 2018-03-18 DIAGNOSIS — Z96651 Presence of right artificial knee joint: Secondary | ICD-10-CM

## 2018-03-18 DIAGNOSIS — M25561 Pain in right knee: Secondary | ICD-10-CM | POA: Insufficient documentation

## 2018-03-18 DIAGNOSIS — M6281 Muscle weakness (generalized): Secondary | ICD-10-CM | POA: Insufficient documentation

## 2018-03-18 MED ORDER — HYDROCODONE-ACETAMINOPHEN 7.5-325 MG PO TABS: 1.0000 | ORAL_TABLET | Freq: Three times a day (TID) | ORAL | 0 refills | Status: DC | PRN

## 2018-03-18 NOTE — Progress Notes (Addendum)
POSTOP VISIT  POD # 43  BP 121/75   Pulse 93   Ht  (1.778 m)   Wt 222 lb (100.7 kg)   BMI 31.85 kg/m   Encounter Diagnosis  Name Primary?  . S/P total knee replacement, right 02/03/18 Yes   Doing well 90 flexion  C/o pain relief with oral opioids  No incisional erythema or joint effusion   Meds ordered this encounter  Medications  . HYDROcodone-acetaminophen (NORCO) 7.5-325 MG tablet    Sig: Take 1 tablet by mouth every 8 (eight) hours as needed for moderate pain.    Dispense:  42 tablet    Refill:  0    Fu in 6 weeks

## 2018-03-18 NOTE — Therapy (Signed)
[facility] [address] [place], [place], [address] Phone: [phone]   Fax:  [phone]  Physical Therapy Treatment/Progress Note  Patient Details  Name: Melvin Turner MRN: [id number] Date of Birth: 10-03-1956 Referring Provider: Arther Abbott, MD   Encounter Date: 03/18/2018   Progress Note Reporting Period 02/24/18 to 03/18/18  See note below for Objective Data and Assessment of Progress/Goals.    PT End of Session - 03/18/18 1626    Visit Number  9    Number of Visits  19    Date for PT Re-Evaluation  04/07/18    Authorization Type  [employer] MULT[employer]AN    Authorization Time Period  02/24/2018 - 04/07/2018     Authorization - Visit Number  1    Authorization - Number of Visits  10    PT Start Time  7414    PT Stop Time  1345    PT Time Calculation (min)  42 min    Activity Tolerance  Patient tolerated treatment well    Behavior During Therapy  WFL for tasks assessed/performed       Past Medical History:  Diagnosis Date  . Arthritis   . BPH (benign prostatic hyperplasia)   . GERD (gastroesophageal reflux disease)   . Hypertension     Past Surgical History:  Procedure Laterality Date  . COLONOSCOPY    . KNEE ARTHROSCOPY Right 01/31/2015   Procedure: RIGHT KNEE ARTHROSCOPY, PARTIAL MEDIAL MENISECTOMY;  Surgeon: Sanjuana Kava, MD;  Location: [facility];  Service: Orthopedics;  Laterality: Right;  . TONSILLECTOMY    . TOTAL KNEE ARTHROPLASTY Right 02/03/2018   Procedure: TOTAL KNEE ARTHROPLASTY;  Surgeon: Carole Civil, MD;  Location: [facility];  Service: Orthopedics;  Laterality: Right;  Marland Kitchen VASECTOMY      There were no vitals filed for this visit.  Subjective Assessment - 03/18/18 1306    Subjective  Patient reports he has been driving the last 2 days a lot since he has had to go up to [place] for his sons funeral. He states the compression garment helped while he was driving. He has a follow up with his MD today before lunch and his  surgeon want him to increase his knee flexion even more in the next 6 weeks to achieve 110 degrees hopefully. He reports he is out of work until June 12th and is concerned about returning to work and having to stand around on concrete floor all day since standing is what bothers him the most.     Limitations  Sitting;Standing;Walking;Lifting;House hold activities    How long can you sit comfortably?  unlimited    How long can you stand comfortably?  being still is difficult; 5 minutes    How long can you walk comfortably?  10-15 minutes maybe more    Patient Stated Goals  improve motion and return to work    Currently in Pain?  Yes    Pain Score  4     Pain Location  Knee    Pain Orientation  Right    Pain Descriptors / Indicators  Aching;Tightness;Numbness    Pain Type  Chronic pain;Surgical pain    Pain Onset  More than a month ago    Pain Frequency  Constant         [facility] PT Assessment - 03/18/18 0001      Assessment   Medical Diagnosis  Right TKA    Referring Provider  Arther Abbott, MD  Onset Date/Surgical Date  02/03/18    Next MD Visit  Mar 18, 2018      Precautions   Precautions  None      Restrictions   Weight Bearing Restrictions  No      Balance Screen   Has the patient fallen in the past 6 months  No      Prior Function   Level of Independence  Independent      Cognition   Overall Cognitive Status  Within Functional Limits for tasks assessed      Observation/Other Assessments   Focus on Therapeutic Outcomes (FOTO)   -- 61% limitd on 02/24/18      Single Leg Stance   Comments  Lt LE = 13 seconds, Rt LE = 13 seconds was 2 sec on Rt LE      AROM   Right Knee Extension  12 was 16    Right Knee Flexion  94 was 80      Strength   Right Hip Flexion  5/5 was 4/5    Right Hip Extension  5/5 was 4-/5    Right Hip ABduction  5/5 was 4-/5    Left Hip Flexion  5/5 was 4+/5    Left Hip Extension  5/5    Left Hip ABduction  5/5    Right Knee Flexion  5/5 was  4-/5    Right Knee Extension  5/5 was 4/5    Left Knee Flexion  5/5    Left Knee Extension  5/5    Right Ankle Dorsiflexion  5/5 was 4/5    Left Ankle Dorsiflexion  5/5      Transfers   Five time sit to stand comments   13.8 seconds with no use of UE, excellent control with no use of momentum      Ambulation/Gait   Ambulation/Gait  Yes    Ambulation/Gait Assistance  7: Independent    Assistive device  Straight cane;None arrived wtih SPC and amb in clinic with no AD    Gait Pattern  Step-through pattern;Decreased stance time - right;Right flexed knee in stance    Ambulation Surface  Level         [facility] Adult PT Treatment/Exercise - 03/18/18 0001      Knee/Hip Exercises: Stretches   Active Hamstring Stretch  Right;3 reps;30 seconds;Limitations    Active Hamstring Stretch Limitations  12" box; AP glide on femur above knee joint    Knee: Self-Stretch to increase Flexion  Right;30 seconds;3 reps    Knee: Self-Stretch Limitations  12" box      Knee/Hip Exercises: Aerobic   Stationary Bike  4 minutes on seat 16, half revolutions to improve right knee ROM at EOS not billed      Knee/Hip Exercises: Standing   Forward Lunges  Both;1 set;15 reps;Limitations    Forward Lunges Limitations  BOSU, ball side up    Terminal Knee Extension  AROM;Strengthening;Right;Theraband;2 sets;Other (comment);15 reps    Theraband Level (Terminal Knee Extension)  Level 4 (Blue)    Lateral Step Up  Right;1 set;15 reps;Step Height: 6";Hand Hold: 1    Forward Step Up  Right;1 set;15 reps;Step Height: 6";Hand Hold: 1    Step Down  Right;2 sets;10 reps;Step Height: 4";Hand Hold: 1    Rocker Board  2 minutes;Limitations    Rocker Board Limitations  2 minutes lateral for AROM       PT Education - 03/18/18 1636    Education provided  Yes  Education Details  Educated on progress towards goals and importance of continuing compliance with HEP to improve ROM.    Person(s) Educated  Patient    Methods   Explanation    Comprehension  Verbalized understanding       PT Short Term Goals - 03/18/18 1308      PT SHORT TERM GOAL #1   Title  Patient will be independent with HEP to improve functinoal strength and ROM to return to PLOF.    Baseline  03/18/18 - good compliance over first 3 weeks    Time  2    Period  Weeks    Status  Achieved      PT SHORT TERM GOAL #2   Title  AROM of Rt knee extension and flexion will improve by 8 degrees or more to allow more normalized gait.    Baseline  03/18/18 - ROM from 12-94 degrees (at evaluation 16-80)    Time  3    Period  Weeks    Status  Partially Met      PT SHORT TERM GOAL #3   Title  Patient will perform 5 time sit to stand in 14 seconds with no use of UE and no evidence of momentum to initiate stand.     Baseline  03/18/18 - 14 seconds     Time  3    Period  Weeks    Status  Achieved      PT SHORT TERM GOAL #4   Title  Patient will be able to perform SLS on Rt LE for 10 seconds or greater to improve balance for increased safety with stairs and gait.    Baseline  03/18/18 - Bil LE = 13 seconds    Time  3    Period  Weeks    Status  Achieved      PT SHORT TERM GOAL #5   Title  Patient will improve MMT by 1/2 grade to demonstrate increased strnehgt for more normalized gait and mobility to return to PLOF and work.     Baseline  03/18/18 - see MMT    Status  Achieved        PT Long Term Goals - 03/18/18 1641      PT LONG TERM GOAL #1   Title  AROM of Rt knee extension and flexion will improve to 0-110 degrees for more to allow more normalized gait.    Time  6    Period  Weeks    Status  On-going      PT LONG TERM GOAL #2   Title  Patient will be abel to perform SLS on Rt LE for 10 seconds or greater to improve balance for increased safety with stairs and gait.    Baseline  03/18/18 - Bil LE = 13 seconds    Time  6    Period  Weeks    Status  Achieved      PT LONG TERM GOAL #3   Title  Patient will ambulate during 2MWT at 1.2 m/s with  normalized gait pattern to demonstrate decresaed risk of falling and improved community ambulation to return to [facility] and work.     Time  6    Period  Weeks    Status  On-going      PT LONG TERM GOAL #4   Title  Patient will ascend/descend 12x 6" stairs wtih step over step pattern and 1 hand rail or less to demonstrate safety and improved  independence with mobility to return to PLOF.     Time  6    Period  Weeks    Status  On-going      PT LONG TERM GOAL #5   Title  Patient will improve MMT by 1 grade to demonstrate increased strnehgt for more normalized gait and mobility to return to PLOF and work.     Baseline  03/18/18 - see MMT    Time  6    Period  Weeks    Status  Achieved        Plan - 03/18/18 1637    Clinical Impression Statement  Re-assessment performed this session and patient has met all short-term goals and one long-term goal. His remaining short-term goal was partially met for AROM of Rt knee. He remains significantly limited into flexion and extension and AROM is currently 12-94 degrees. He has made significant improvement in LE strength and is functionally 5/5 throughout, and pain free. He has also improved balance with SLS and is able to maintain balance for 13 seconds on  Rt LE compared to 4 seconds on evaluation. He will continue to benefit from skilled PT services to address remaining deficits with continued focus being placed on improving ROM.    Rehab Potential  Fair    PT Frequency  3x / week    PT Duration  6 weeks    PT Treatment/Interventions  ADLs/Self Care Home Management;Aquatic Therapy;Cryotherapy;Electrical Stimulation;DME Instruction;Gait training;Stair training;Functional mobility training;Therapeutic activities;Therapeutic exercise;Balance training;Neuromuscular re-education;Patient/family education;Manual techniques;Scar mobilization;Passive range of motion;Energy conservation;Taping    PT Next Visit Plan  Continue with ROM techniques and exercises. Continue  with bike for AROM. Advance quad and hamstring strengthening, as patient is ready. Continue with sustained extension stretch and add to HEP next session. Perform manual to improve quad extensibility.    PT Home Exercise Plan  Eval: quad set, seated AAROM for knee flexion; 02/25/18 - SAQ; 03/04/18 - knee flexino stretch on step, hamstring stretch on step;     Consulted and Agree with Plan of Care  Patient       Patient will benefit from skilled therapeutic intervention in order to improve the following deficits and impairments:  Abnormal gait, Decreased skin integrity, Increased fascial restricitons, Pain, Decreased scar mobility, Decreased mobility, Decreased activity tolerance, Decreased endurance, Decreased range of motion, Decreased strength, Hypomobility, Impaired flexibility, Difficulty walking, Decreased balance  Visit Diagnosis: Right knee pain, unspecified chronicity  Stiffness of right knee, not elsewhere classified  Other abnormalities of gait and mobility  Muscle weakness (generalized)     Problem List Patient Active Problem List   Diagnosis Date Noted  . S/P total knee replacement, right 02/03/18     Kipp Brood, PT, DPT Physical Therapist with [facility]  03/18/2018 4:42 PM    [facility] [address] [place], [place], [address] Phone: [phone]   Fax:  [phone]  Name: KARLA VINES MRN: [id number] Date of Birth: 12/06/1955

## 2018-03-20 ENCOUNTER — Encounter (HOSPITAL_COMMUNITY): Payer: Self-pay | Admitting: Physical Therapy

## 2018-03-20 ENCOUNTER — Ambulatory Visit (HOSPITAL_COMMUNITY): Payer: PRIVATE HEALTH INSURANCE | Admitting: Physical Therapy

## 2018-03-20 DIAGNOSIS — M25561 Pain in right knee: Secondary | ICD-10-CM

## 2018-03-20 DIAGNOSIS — M6281 Muscle weakness (generalized): Secondary | ICD-10-CM

## 2018-03-20 DIAGNOSIS — M25661 Stiffness of right knee, not elsewhere classified: Secondary | ICD-10-CM

## 2018-03-20 DIAGNOSIS — R2689 Other abnormalities of gait and mobility: Secondary | ICD-10-CM

## 2018-03-20 NOTE — Patient Instructions (Signed)
  KNEE EXTENSION GRAVITY STRETCH - PROPPED FOOT While lying or sitting with a small towel roll under your ankle, fully relax your leg. Your knee should move lower as you allow gravity stretch your knee into a more straightened position. Add 7.5# right above knee for stretch. Hold for 3 minutes Repeat 1 Time Complete 1 Set Perform 1 Time(s) a Day

## 2018-03-20 NOTE — Therapy (Signed)
[facility] [place], [place], [address] Phone: [phone]   Fax:  [phone]  Physical Therapy Treatment  Patient Details  Name: Melvin Turner MRN: [id number] Date of Birth: 06-15-1956 Referring Provider: Arther Abbott, MD   Encounter Date: 03/20/2018  PT End of Session - 03/20/18 1348    Visit Number  10    Number of Visits  19    Date for PT Re-Evaluation  04/07/18    Authorization Type  [employer] MULTIPLAN    Authorization Time Period  02/24/2018 - 04/07/2018     Authorization - Visit Number  2    Authorization - Number of Visits  10    PT Start Time  1302    PT Stop Time  1341    PT Time Calculation (min)  39 min    Activity Tolerance  Patient tolerated treatment well    Behavior During Therapy  [facility] for tasks assessed/performed       Past Medical History:  Diagnosis Date  . Arthritis   . BPH (benign prostatic hyperplasia)   . GERD (gastroesophageal reflux disease)   . Hypertension     Past Surgical History:  Procedure Laterality Date  . COLONOSCOPY    . KNEE ARTHROSCOPY Right 01/31/2015   Procedure: RIGHT KNEE ARTHROSCOPY, PARTIAL MEDIAL MENISECTOMY;  Surgeon: Sanjuana Kava, MD;  Location: [facility];  Service: Orthopedics;  Laterality: Right;  . TONSILLECTOMY    . TOTAL KNEE ARTHROPLASTY Right 02/03/2018   Procedure: TOTAL KNEE ARTHROPLASTY;  Surgeon: Carole Civil, MD;  Location: [facility];  Service: Orthopedics;  Laterality: Right;  Marland Kitchen VASECTOMY      There were no vitals filed for this visit.  Subjective Assessment - 03/20/18 1306    Subjective  Patient said he has been "running" around a lot today. He said his knee isn't bothering him too bad.     Limitations  Sitting;Standing;Walking;Lifting;House hold activities    How long can you sit comfortably?  unlimited    How long can you stand comfortably?  being still is difficult; 5 minutes    How long can you walk comfortably?  10-15 minutes maybe more    Patient Stated Goals  improve motion and return to work    Currently in Pain?  Yes    Pain Score  3     Pain Location  Knee    Pain Orientation  Right    Pain Descriptors / Indicators  Aching;Tightness    Pain Type  Chronic pain;Surgical pain    Pain Onset  More than a month ago    Multiple Pain Sites  No                       [facility] Adult PT Treatment/Exercise - 03/20/18 0001      Knee/Hip Exercises: Stretches   Active Hamstring Stretch  Right;3 reps;30 seconds;Limitations    Active Hamstring Stretch Limitations  12" box; AP glide on femur above knee joint    Knee: Self-Stretch to increase Flexion  Right;30 seconds;3 reps    Knee: Self-Stretch Limitations  12" box    Gastroc Stretch  Both;3 reps;30 seconds;Limitations    Other Knee/Hip Stretches  Sustained knee extension stretch in supine: 7.5 lbs on anterior knee for 3 minutes Added to HEP this session      Knee/Hip Exercises: Aerobic   Stationary Bike  4 minutes on seat 15, half revolutions to improve right knee ROM At  beginning of session      Knee/Hip Exercises: Standing   Forward Lunges  Both;1 set;15 reps;Limitations    Forward Lunges Limitations  BOSU, ball side up    Terminal Knee Extension  AROM;Strengthening;Right;Theraband;2 sets;Other (comment);15 reps    Theraband Level (Terminal Knee Extension)  Level 4 (Blue) 10 second holds    Lateral Step Up  Right;1 set;15 reps;Step Height: 6";Hand Hold: 1    Forward Step Up  Right;1 set;15 reps;Step Height: 6";Hand Hold: 1    Step Down  Right;2 sets;10 reps;Hand Hold: 1;Step Height: 6"    Rocker Board  2 minutes;Limitations    Rocker Board Limitations  2 minutes lateral for AROM      Knee/Hip Exercises: Supine   Knee Extension  AROM;Right;Limitations    Knee Extension Limitations  11    Knee Flexion  AROM;Right;Limitations    Knee Flexion Limitations  94             PT Education - 03/20/18 1347    Education provided  Yes    Education Details   Educated on purpose and technique with exercises and on updated HEP.     Person(s) Educated  Patient    Methods  Explanation    Comprehension  Verbalized understanding       PT Short Term Goals - 03/18/18 1308      PT SHORT TERM GOAL #1   Title  Patient will be independent with HEP to improve functinoal strength and ROM to return to PLOF.    Baseline  03/18/18 - good compliance over first 3 weeks    Time  2    Period  Weeks    Status  Achieved      PT SHORT TERM GOAL #2   Title  AROM of Rt knee extension and flexion will improve by 8 degrees or more to allow more normalized gait.    Baseline  03/18/18 - ROM from 12-94 degrees (at evaluation 16-80)    Time  3    Period  Weeks    Status  Partially Met      PT SHORT TERM GOAL #3   Title  Patient will perform 5 time sit to stand in 14 seconds with no use of UE and no evidence of momentum to initiate stand.     Baseline  03/18/18 - 14 seconds     Time  3    Period  Weeks    Status  Achieved      PT SHORT TERM GOAL #4   Title  Patient will be able to perform SLS on Rt LE for 10 seconds or greater to improve balance for increased safety with stairs and gait.    Baseline  03/18/18 - Bil LE = 13 seconds    Time  3    Period  Weeks    Status  Achieved      PT SHORT TERM GOAL #5   Title  Patient will improve MMT by 1/2 grade to demonstrate increased strnehgt for more normalized gait and mobility to return to PLOF and work.     Baseline  03/18/18 - see MMT    Status  Achieved        PT Long Term Goals - 03/18/18 1641      PT LONG TERM GOAL #1   Title  AROM of Rt knee extension and flexion will improve to 0-110 degrees for more to allow more normalized gait.    Time  6  Period  Weeks    Status  On-going      PT LONG TERM GOAL #2   Title  Patient will be abel to perform SLS on Rt LE for 10 seconds or greater to improve balance for increased safety with stairs and gait.    Baseline  03/18/18 - Bil LE = 13 seconds    Time  6    Period   Weeks    Status  Achieved      PT LONG TERM GOAL #3   Title  Patient will ambulate during 2MWT at 1.2 m/s with normalized gait pattern to demonstrate decresaed risk of falling and improved community ambulation to return to [facility] and work.     Time  6    Period  Weeks    Status  On-going      PT LONG TERM GOAL #4   Title  Patient will ascend/descend 12x 6" stairs wtih step over step pattern and 1 hand rail or less to demonstrate safety and improved independence with mobility to return to PLOF.     Time  6    Period  Weeks    Status  On-going      PT LONG TERM GOAL #5   Title  Patient will improve MMT by 1 grade to demonstrate increased strnehgt for more normalized gait and mobility to return to PLOF and work.     Baseline  03/18/18 - see MMT    Time  6    Period  Weeks    Status  Achieved            Plan - 03/20/18 1346    Clinical Impression Statement  This session continued to work on improving patient's knee range of motion as he is still limited in both knee flexion and extension ROM. This session increased the step-down height of the step to 6 inches which patient. This session gave patient the sustained knee extension stretch to perform at home as he reported he has plenty of equipment at home to perform it. This session patient's knee extension ROM was at 11 degrees and knee flexion ROM was at 94 degrees. Patient would benefit from continued skilled physical therapy to continue addressing patient's deficits in right knee ROM.     Rehab Potential  Fair    PT Frequency  3x / week    PT Duration  6 weeks    PT Treatment/Interventions  ADLs/Self Care Home Management;Aquatic Therapy;Cryotherapy;Electrical Stimulation;DME Instruction;Gait training;Stair training;Functional mobility training;Therapeutic activities;Therapeutic exercise;Balance training;Neuromuscular re-education;Patient/family education;Manual techniques;Scar mobilization;Passive range of motion;Energy  conservation;Taping    PT Next Visit Plan  Continue with ROM techniques and exercises. Continue with bike for AROM. Advance quad and hamstring strengthening, as patient is ready. Perform manual to improve quad extensibility.    PT Home Exercise Plan  Eval: quad set, seated AAROM for knee flexion; 02/25/18 - SAQ; 03/04/18 - knee flexino stretch on step, hamstring stretch on step;     Consulted and Agree with Plan of Care  Patient       Patient will benefit from skilled therapeutic intervention in order to improve the following deficits and impairments:  Abnormal gait, Decreased skin integrity, Increased fascial restricitons, Pain, Decreased scar mobility, Decreased mobility, Decreased activity tolerance, Decreased endurance, Decreased range of motion, Decreased strength, Hypomobility, Impaired flexibility, Difficulty walking, Decreased balance  Visit Diagnosis: Right knee pain, unspecified chronicity  Stiffness of right knee, not elsewhere classified  Other abnormalities of gait and mobility  Muscle weakness (  generalized)     Problem List Patient Active Problem List   Diagnosis Date Noted  . S/P total knee replacement, right 02/03/18    Clarene Critchley PT, DPT 1:50 PM, 03/20/18 [place], [place], [address] Phone: [phone]   Fax:  [phone]  Name: CAROLE DEERE MRN: [id number] Date of Birth: 1956/02/20

## 2018-03-23 ENCOUNTER — Ambulatory Visit (HOSPITAL_COMMUNITY): Payer: PRIVATE HEALTH INSURANCE | Admitting: Physical Therapy

## 2018-03-23 ENCOUNTER — Encounter (HOSPITAL_COMMUNITY): Payer: Self-pay | Admitting: Physical Therapy

## 2018-03-23 DIAGNOSIS — M25561 Pain in right knee: Secondary | ICD-10-CM

## 2018-03-23 DIAGNOSIS — R2689 Other abnormalities of gait and mobility: Secondary | ICD-10-CM

## 2018-03-23 DIAGNOSIS — M6281 Muscle weakness (generalized): Secondary | ICD-10-CM

## 2018-03-23 DIAGNOSIS — M25661 Stiffness of right knee, not elsewhere classified: Secondary | ICD-10-CM

## 2018-03-23 NOTE — Therapy (Signed)
[place], [place], [address] Phone: [phone]   Fax:  [phone]  Physical Therapy Treatment  Patient Details  Name: Melvin Turner MRN: [id number] Date of Birth: [birth year] Referring Provider: Arther Abbott, MD   Encounter Date: 03/23/2018  PT End of Session - 03/23/18 1109    Visit Number  11    Number of Visits  19    Date for PT Re-Evaluation  04/07/18    Authorization Type  [employer] MULT[employer]AN    Authorization Time Period  02/24/2018 - 04/07/2018     Authorization - Visit Number  2    Authorization - Number of Visits  10    PT Start Time  1030    PT Stop Time  1115    PT Time Calculation (min)  45 min    Activity Tolerance  Patient tolerated treatment well    Behavior During Therapy  [facility] for tasks assessed/performed       Past Medical History:  Diagnosis Date  . Arthritis   . BPH (benign prostatic hyperplasia)   . GERD (gastroesophageal reflux disease)   . Hypertension     Past Surgical History:  Procedure Laterality Date  . COLONOSCOPY    . KNEE ARTHROSCOPY Right 01/31/2015   Procedure: RIGHT KNEE ARTHROSCOPY, PARTIAL MEDIAL MENISECTOMY;  Surgeon: Sanjuana Kava, MD;  Location: [facility];  Service: Orthopedics;  Laterality: Right;  . TONSILLECTOMY    . TOTAL KNEE ARTHROPLASTY Right 02/03/2018   Procedure: TOTAL KNEE ARTHROPLASTY;  Surgeon: Carole Civil, MD;  Location: [facility];  Service: Orthopedics;  Laterality: Right;  Marland Kitchen VASECTOMY      There were no vitals filed for this visit.  Subjective Assessment - 03/23/18 1028    Subjective  Pt statest that he has not been using his cane much since his last visit.  He climbed a step ladder yesterday without problem     Limitations  Sitting;Standing;Walking;Lifting;House hold activities    How long can you sit comfortably?  unlimited    How long can you stand comfortably?  being still is difficult; 5 minutes    How long can you walk comfortably?  10-15  minutes maybe more    Patient Stated Goals  improve motion and return to work    Currently in Pain?  No/denies    Pain Onset  More than a month ago                       [facility] Adult PT Treatment/Exercise - 03/23/18 0001      Knee/Hip Exercises: Stretches   Active Hamstring Stretch  Right;3 reps;30 seconds;Limitations    Active Hamstring Stretch Limitations  12" box; AP glide on femur above knee joint    Quad Stretch  3 reps;30 seconds    Knee: Self-Stretch to increase Flexion  Right;30 seconds;3 reps    Knee: Self-Stretch Limitations  12" box    Other Knee/Hip Stretches  -- Added to HEP this session      Knee/Hip Exercises: Aerobic   Stationary Bike  -- At beginning of session      Knee/Hip Exercises: Standing   Heel Raises  Right;10 reps    Knee Flexion  Right;10 reps    Forward Lunges  Both;1 set;15 reps;Limitations    Forward Lunges Limitations  BOSU, ball side up    Theraband Level (Terminal Knee Extension)  -- 10 second holds    Lateral Step Up  Right;1 set;15 reps;Step Height: 6";Hand Hold: 1    Forward Step Up  Right;1 set;15 reps;Step Height: 6";Hand Hold: 1    Step Down  Right;15 reps;Hand Hold: 1;Step Height: 6"    Rocker Board  2 minutes;Limitations    Rocker Board Limitations  2 minutes lateral for AROM    SLS  5 x each       Knee/Hip Exercises: Supine   Knee Extension  AROM;Right;Limitations    Knee Extension Limitations  8    Knee Flexion  AROM;Right;Limitations    Knee Flexion Limitations  99      Knee/Hip Exercises: Prone   Hamstring Curl  5 reps    Contract/Relax to Increase Flexion  x5    Other Prone Exercises  terminal extension and flexion both x 10 reps                PT Short Term Goals - 03/18/18 1308      PT SHORT TERM GOAL #1   Title  Patient will be independent with HEP to improve functinoal strength and ROM to return to PLOF.    Baseline  03/18/18 - good compliance over first 3 weeks    Time  2    Period  Weeks     Status  Achieved      PT SHORT TERM GOAL #2   Title  AROM of Rt knee extension and flexion will improve by 8 degrees or more to allow more normalized gait.    Baseline  03/18/18 - ROM from 12-94 degrees (at evaluation 16-80)    Time  3    Period  Weeks    Status  Partially Met      PT SHORT TERM GOAL #3   Title  Patient will perform 5 time sit to stand in 14 seconds with no use of UE and no evidence of momentum to initiate stand.     Baseline  03/18/18 - 14 seconds     Time  3    Period  Weeks    Status  Achieved      PT SHORT TERM GOAL #4   Title  Patient will be able to perform SLS on Rt LE for 10 seconds or greater to improve balance for increased safety with stairs and gait.    Baseline  03/18/18 - Bil LE = 13 seconds    Time  3    Period  Weeks    Status  Achieved      PT SHORT TERM GOAL #5   Title  Patient will improve MMT by 1/2 grade to demonstrate increased strnehgt for more normalized gait and mobility to return to PLOF and work.     Baseline  03/18/18 - see MMT    Status  Achieved        PT Long Term Goals - 03/18/18 1641      PT LONG TERM GOAL #1   Title  AROM of Rt knee extension and flexion will improve to 0-110 degrees for more to allow more normalized gait.    Time  6    Period  Weeks    Status  On-going      PT LONG TERM GOAL #2   Title  Patient will be abel to perform SLS on Rt LE for 10 seconds or greater to improve balance for increased safety with stairs and gait.    Baseline  03/18/18 - Bil LE = 13 seconds    Time  6  Period  Weeks    Status  Achieved      PT LONG TERM GOAL #3   Title  Patient will ambulate during 2MWT at 1.2 m/s with normalized gait pattern to demonstrate decresaed risk of falling and improved community ambulation to return to [facility] and work.     Time  6    Period  Weeks    Status  On-going      PT LONG TERM GOAL #4   Title  Patient will ascend/descend 12x 6" stairs wtih step over step pattern and 1 hand rail or less to demonstrate  safety and improved independence with mobility to return to PLOF.     Time  6    Period  Weeks    Status  On-going      PT LONG TERM GOAL #5   Title  Patient will improve MMT by 1 grade to demonstrate increased strnehgt for more normalized gait and mobility to return to PLOF and work.     Baseline  03/18/18 - see MMT    Time  6    Period  Weeks    Status  Achieved            Plan - 03/23/18 1109    Clinical Impression Statement  Added SLS as well as prone exercises to improve ROM.  Pt needs to concentrate on balance and ROM at this point as strength is doing well.  Pt now walking without his cane.     Rehab Potential  Fair    PT Frequency  3x / week    PT Duration  6 weeks    PT Treatment/Interventions  ADLs/Self Care Home Management;Aquatic Therapy;Cryotherapy;Electrical Stimulation;DME Instruction;Gait training;Stair training;Functional mobility training;Therapeutic activities;Therapeutic exercise;Balance training;Neuromuscular re-education;Patient/family education;Manual techniques;Scar mobilization;Passive range of motion;Energy conservation;Taping    PT Next Visit Plan  ROM and balance activitity     PT Home Exercise Plan  Eval: quad set, seated AAROM for knee flexion; 02/25/18 - SAQ; 03/04/18 - knee flexino stretch on step, hamstring stretch on step;     Consulted and Agree with Plan of Care  Patient       Patient will benefit from skilled therapeutic intervention in order to improve the following deficits and impairments:  Abnormal gait, Decreased skin integrity, Increased fascial restricitons, Pain, Decreased scar mobility, Decreased mobility, Decreased activity tolerance, Decreased endurance, Decreased range of motion, Decreased strength, Hypomobility, Impaired flexibility, Difficulty walking, Decreased balance  Visit Diagnosis: Right knee pain, unspecified chronicity  Stiffness of right knee, not elsewhere classified  Other abnormalities of gait and mobility  Muscle  weakness (generalized)     Problem List Patient Active Problem List   Diagnosis Date Noted  . S/P total knee replacement, right 02/03/18    Rayetta Humphrey, PT CLT [phone] 03/23/2018, 11:16 AM  [place], [place], [address] Phone: [phone]   Fax:  [phone]  Name: Melvin Turner MRN: [id number] Date of Birth: Jul 07, 1956

## 2018-03-25 ENCOUNTER — Ambulatory Visit (HOSPITAL_COMMUNITY): Payer: PRIVATE HEALTH INSURANCE

## 2018-03-25 ENCOUNTER — Other Ambulatory Visit: Payer: Self-pay

## 2018-03-25 ENCOUNTER — Encounter (HOSPITAL_COMMUNITY): Payer: Self-pay

## 2018-03-25 DIAGNOSIS — M6281 Muscle weakness (generalized): Secondary | ICD-10-CM

## 2018-03-25 DIAGNOSIS — M25661 Stiffness of right knee, not elsewhere classified: Secondary | ICD-10-CM

## 2018-03-25 DIAGNOSIS — R2689 Other abnormalities of gait and mobility: Secondary | ICD-10-CM

## 2018-03-25 DIAGNOSIS — M25561 Pain in right knee: Secondary | ICD-10-CM

## 2018-03-25 NOTE — Therapy (Signed)
[place], [place], [address] Phone: [phone]   Fax:  [phone]  Physical Therapy Treatment  Patient Details  Name: Melvin Turner MRN: [id number] Date of Birth: 17-Nov-1956 Referring Provider: Arther Abbott, MD   Encounter Date: 03/25/2018  PT End of Session - 03/25/18 0826    Visit Number  12    Number of Visits  19    Date for PT Re-Evaluation  04/07/18    Authorization Type  [employer] MULTIPLAN    Authorization Time Period  02/24/2018 - 04/07/2018     Authorization - Visit Number  3    Authorization - Number of Visits  10    PT Start Time  0817    PT Stop Time  0900    PT Time Calculation (min)  43 min    Activity Tolerance  Patient tolerated treatment well    Behavior During Therapy  [facility] for tasks assessed/performed       Past Medical History:  Diagnosis Date  . Arthritis   . BPH (benign prostatic hyperplasia)   . GERD (gastroesophageal reflux disease)   . Hypertension     Past Surgical History:  Procedure Laterality Date  . COLONOSCOPY    . KNEE ARTHROSCOPY Right 01/31/2015   Procedure: RIGHT KNEE ARTHROSCOPY, PARTIAL MEDIAL MENISECTOMY;  Surgeon: Sanjuana Kava, MD;  Location: [facility];  Service: Orthopedics;  Laterality: Right;  . TONSILLECTOMY    . TOTAL KNEE ARTHROPLASTY Right 02/03/2018   Procedure: TOTAL KNEE ARTHROPLASTY;  Surgeon: Carole Civil, MD;  Location: [facility];  Service: Orthopedics;  Laterality: Right;  Marland Kitchen VASECTOMY      There were no vitals filed for this visit.  Subjective Assessment - 03/25/18 0824    Subjective  Patient is doing well today. His knee appears more swollen today and he admits he has not been wearing his compression garment for about 2 days. He has continued to perform his HEP daily and is excited he bent his knee to 99 degrees last session.    Limitations  Sitting;Standing;Walking;Lifting;House hold activities    How long can you sit comfortably?  unlimited    How long can you stand comfortably?  being still is difficult; 5 minutes    How long can you walk comfortably?  10-15 minutes maybe more    Patient Stated Goals  improve motion and return to work    Currently in Pain?  No/denies        [facility] Adult PT Treatment/Exercise - 03/25/18 0001      Knee/Hip Exercises: Stretches   Active Hamstring Stretch  Right;3 reps;30 seconds;Limitations    Active Hamstring Stretch Limitations  12" box; AP glide on femur above knee joint    Quad Stretch  Right;2 reps;30 seconds;Limitations    Quad Stretch Limitations  footback on 18" box for hip flexor/quad stretch    Knee: Self-Stretch to increase Flexion  Right;30 seconds;3 reps    Knee: Self-Stretch Limitations  12" box      Knee/Hip Exercises: Aerobic   Stationary Bike  4 minutes on seat 16, half revolutions to improve right knee ROM at start of session, nmot billed      Knee/Hip Exercises: Standing   Forward Lunges  15 reps;Limitations;2 sets;Right    Forward Lunges Limitations  BOSU, ball side up    Terminal Knee Extension  AROM;Strengthening;Right;Theraband;2 sets;Other (comment);15 reps    Theraband Level (Terminal Knee Extension)  Level 4 (Blue) 10 sec holds  Step Down  Right;15 reps;Hand Hold: 1;Step Height: 6" 2 sets    Rocker Board  2 minutes;Limitations    Rocker Board Limitations  2 minutes lateral for AROM    SLS with Vectors  15 reps on Rt LE with 1 HHA       Knee/Hip Exercises: Supine   Knee Extension  AROM;Right;Limitations    Knee Extension Limitations  8    Knee Flexion  AROM;Right;Limitations    Knee Flexion Limitations  101      Knee/Hip Exercises: Prone   Contract/Relax to Increase Flexion  --      Manual Therapy   Manual Therapy  Joint mobilization;Soft tissue mobilization    Manual therapy comments  completed seperate from other skilled therapy    Joint Mobilization  AP glide to Rt tibiofemoral joint with inferior distraction wtih patient seated, 3x 30 seconds grade III  oscillations    Soft tissue mobilization  sustained pressure to knots and parallel glides along Rt quadriceps tendon insertion with patient being manually stretched into end range flexion while seated.        PT Education - 03/25/18 2841    Education provided  Yes    Education Details  Educated on exercises throughout. Educated on importance of wearing compression garment during exercises to reduce swelling and improve ROM.     Person(s) Educated  Patient    Methods  Explanation    Comprehension  Verbalized understanding       PT Short Term Goals - 03/18/18 1308      PT SHORT TERM GOAL #1   Title  Patient will be independent with HEP to improve functinoal strength and ROM to return to PLOF.    Baseline  03/18/18 - good compliance over first 3 weeks    Time  2    Period  Weeks    Status  Achieved      PT SHORT TERM GOAL #2   Title  AROM of Rt knee extension and flexion will improve by 8 degrees or more to allow more normalized gait.    Baseline  03/18/18 - ROM from 12-94 degrees (at evaluation 16-80)    Time  3    Period  Weeks    Status  Partially Met      PT SHORT TERM GOAL #3   Title  Patient will perform 5 time sit to stand in 14 seconds with no use of UE and no evidence of momentum to initiate stand.     Baseline  03/18/18 - 14 seconds     Time  3    Period  Weeks    Status  Achieved      PT SHORT TERM GOAL #4   Title  Patient will be able to perform SLS on Rt LE for 10 seconds or greater to improve balance for increased safety with stairs and gait.    Baseline  03/18/18 - Bil LE = 13 seconds    Time  3    Period  Weeks    Status  Achieved      PT SHORT TERM GOAL #5   Title  Patient will improve MMT by 1/2 grade to demonstrate increased strnehgt for more normalized gait and mobility to return to PLOF and work.     Baseline  03/18/18 - see MMT    Status  Achieved        PT Long Term Goals - 03/18/18 1641      PT LONG TERM  GOAL #1   Title  AROM of Rt knee extension and  flexion will improve to 0-110 degrees for more to allow more normalized gait.    Time  6    Period  Weeks    Status  On-going      PT LONG TERM GOAL #2   Title  Patient will be abel to perform SLS on Rt LE for 10 seconds or greater to improve balance for increased safety with stairs and gait.    Baseline  03/18/18 - Bil LE = 13 seconds    Time  6    Period  Weeks    Status  Achieved      PT LONG TERM GOAL #3   Title  Patient will ambulate during 2MWT at 1.2 m/s with normalized gait pattern to demonstrate decresaed risk of falling and improved community ambulation to return to [facility] and work.     Time  6    Period  Weeks    Status  On-going      PT LONG TERM GOAL #4   Title  Patient will ascend/descend 12x 6" stairs wtih step over step pattern and 1 hand rail or less to demonstrate safety and improved independence with mobility to return to PLOF.     Time  6    Period  Weeks    Status  On-going      PT LONG TERM GOAL #5   Title  Patient will improve MMT by 1 grade to demonstrate increased strnehgt for more normalized gait and mobility to return to PLOF and work.     Baseline  03/18/18 - see MMT    Time  6    Period  Weeks    Status  Achieved         Plan - 03/25/18 0827    Clinical Impression Statement  Continued to target patient ROM and included balance interventions to address limitations in SLS on Rt LE. Stretches for Rt knee flexion progressed today with deep hip flexor/quad stretch on box and with end range flexion stretch with manual interventions performed during. Patient had good tolerance to new manual treatments and ROM was 8-101 degrees at end of session. He was educated on importance of scar tissue mobilization and wearing compression garment to reduce swelling to improve mobility and decrease stiffness of Rt knee joint. He will continue to benefit from skilled physical therapy to continue addressing patient's deficits in right knee ROM.    Rehab Potential  Fair    PT  Frequency  3x / week    PT Duration  6 weeks    PT Treatment/Interventions  ADLs/Self Care Home Management;Aquatic Therapy;Cryotherapy;Electrical Stimulation;DME Instruction;Gait training;Stair training;Functional mobility training;Therapeutic activities;Therapeutic exercise;Balance training;Neuromuscular re-education;Patient/family education;Manual techniques;Scar mobilization;Passive range of motion;Energy conservation;Taping    PT Next Visit Plan  Focus on ROM and balance. Continue with ROM techniques and exercises. Continue with quad stretch and STM in seated at end range flexion. Perform scar tissue mobilization and continue with joint mob with distraction for flexion glide. Continue with bike for AROM. Advance quad and hamstring strengthening, as patient is ready. Perform manual to improve quad extensibility.    PT Home Exercise Plan  Eval: quad set, seated AAROM for knee flexion; 02/25/18 - SAQ; 03/04/18 - knee flexino stretch on step, hamstring stretch on step;     Consulted and Agree with Plan of Care  Patient       Patient will benefit from skilled therapeutic intervention in order to  improve the following deficits and impairments:  Abnormal gait, Decreased skin integrity, Increased fascial restricitons, Pain, Decreased scar mobility, Decreased mobility, Decreased activity tolerance, Decreased endurance, Decreased range of motion, Decreased strength, Hypomobility, Impaired flexibility, Difficulty walking, Decreased balance  Visit Diagnosis: Right knee pain, unspecified chronicity  Stiffness of right knee, not elsewhere classified  Other abnormalities of gait and mobility  Muscle weakness (generalized)     Problem List Patient Active Problem List   Diagnosis Date Noted  . 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 [place], [place], [address] Phone: [phone]   Fax:  [phone]  Name: Melvin Turner MRN: [id number] Date of Birth: 1956/09/10

## 2018-03-26 ENCOUNTER — Ambulatory Visit (HOSPITAL_COMMUNITY): Payer: PRIVATE HEALTH INSURANCE | Admitting: Physical Therapy

## 2018-03-26 DIAGNOSIS — R2689 Other abnormalities of gait and mobility: Secondary | ICD-10-CM

## 2018-03-26 DIAGNOSIS — M25661 Stiffness of right knee, not elsewhere classified: Secondary | ICD-10-CM

## 2018-03-26 DIAGNOSIS — M25561 Pain in right knee: Secondary | ICD-10-CM | POA: Diagnosis not present

## 2018-03-26 DIAGNOSIS — M6281 Muscle weakness (generalized): Secondary | ICD-10-CM

## 2018-03-26 NOTE — Therapy (Signed)
Melvin Turner, [place], [address] Phone: [phone]   Fax:  [phone]  Physical Therapy Treatment  Patient Details  Name: Melvin Turner MRN: [id number] Date of Birth: 01-15-1956 Referring Provider: Arther Abbott, MD   Encounter Date: 03/26/2018  PT End of Session - 03/26/18 0951    Visit Number  13    Number of Visits  19    Date for PT Re-Evaluation  04/07/18    Authorization Type  [employer] MULT[employer]AN    Authorization Time Period  02/24/2018 - 04/07/2018     Authorization - Visit Number  3    Authorization - Number of Visits  10    PT Start Time  0817    PT Stop Time  0901    PT Time Calculation (min)  44 min    Activity Tolerance  Patient tolerated treatment well    Behavior During Therapy  [facility] for tasks assessed/performed       Past Medical History:  Diagnosis Date  . Arthritis   . BPH (benign prostatic hyperplasia)   . GERD (gastroesophageal reflux disease)   . Hypertension     Past Surgical History:  Procedure Laterality Date  . COLONOSCOPY    . KNEE ARTHROSCOPY Right 01/31/2015   Procedure: RIGHT KNEE ARTHROSCOPY, PARTIAL MEDIAL MENISECTOMY;  Surgeon: Sanjuana Kava, MD;  Location: [facility];  Service: Orthopedics;  Laterality: Right;  . TONSILLECTOMY    . TOTAL KNEE ARTHROPLASTY Right 02/03/2018   Procedure: TOTAL KNEE ARTHROPLASTY;  Surgeon: Carole Civil, MD;  Location: [facility];  Service: Orthopedics;  Laterality: Right;  Marland Kitchen VASECTOMY      There were no vitals filed for this visit.  Subjective Assessment - 03/26/18 0823    Subjective  PT states he is stiff today but no pain.  Comes today wearing his cmopression stocking on Rt LE with noted reduction in edema.    Currently in Pain?  No/denies                       [facility] Adult PT Treatment/Exercise - 03/26/18 0001      Knee/Hip Exercises: Stretches   Active Hamstring Stretch  Right;3 reps;30 seconds;Limitations    Active  Hamstring Stretch Limitations  12" box; AP glide on femur above knee joint    Quad Stretch  Right;2 reps;30 seconds;Limitations    Quad Stretch Limitations  footback on chair for hip flexor/quad stretch    Knee: Self-Stretch to increase Flexion  Right;30 seconds;3 reps    Knee: Self-Stretch Limitations  12" box      Knee/Hip Exercises: Aerobic   Stationary Bike  4 minutes on seat 16, half revolutions to improve right knee ROM      Knee/Hip Exercises: Standing   Forward Lunges  15 reps;Limitations;2 sets;Right    Forward Lunges Limitations  BOSU, ball side up    Lateral Step Up  Right;1 set;15 reps;Step Height: 6";Hand Hold: 1;Limitations    Lateral Step Up Limitations  heel tap    Forward Step Up  Right;1 set;15 reps;Hand Hold: 1;Step Height: 6";Limitations    Forward Step Up Limitations  with opposite knee drive    Step Down  IHWTU;88 reps;Hand Hold: 1;Step Height: 6"    SLS with Vectors  15 reps on Rt LE with 1 HHA       Knee/Hip Exercises: Supine   Quad Sets  Right;10 reps    Heel Slides  Right;10 reps  Knee Extension  AROM;Right;Limitations    Knee Extension Limitations  6    Knee Flexion  AROM;Right;Limitations    Knee Flexion Limitations  105 109 PROM      Knee/Hip Exercises: Prone   Prone Knee Hang  4 minutes with manual to posterior LE      Manual Therapy   Manual Therapy  Soft tissue mobilization;Myofascial release    Manual therapy comments  completed seperate from other skilled therapy    Soft tissue mobilization  to perimeter of knee and surrounding musculature to decrease tightness    Myofascial Release  to scar tissue to decrease adhesions             PT Education - 03/25/18 0828    Education provided  Yes    Education Details  Educated on exercises throughout. Educated on importance of wearing compression garment during exercises to reduce swelling and improve ROM.     Person(s) Educated  Patient    Methods  Explanation    Comprehension  Verbalized  understanding       PT Short Term Goals - 03/18/18 1308      PT SHORT TERM GOAL #1   Title  Patient will be independent with HEP to improve functinoal strength and ROM to return to PLOF.    Baseline  03/18/18 - good compliance over first 3 weeks    Time  2    Period  Weeks    Status  Achieved      PT SHORT TERM GOAL #2   Title  AROM of Rt knee extension and flexion will improve by 8 degrees or more to allow more normalized gait.    Baseline  03/18/18 - ROM from 12-94 degrees (at evaluation 16-80)    Time  3    Period  Weeks    Status  Partially Met      PT SHORT TERM GOAL #3   Title  Patient will perform 5 time sit to stand in 14 seconds with no use of UE and no evidence of momentum to initiate stand.     Baseline  03/18/18 - 14 seconds     Time  3    Period  Weeks    Status  Achieved      PT SHORT TERM GOAL #4   Title  Patient will be able to perform SLS on Rt LE for 10 seconds or greater to improve balance for increased safety with stairs and gait.    Baseline  03/18/18 - Bil LE = 13 seconds    Time  3    Period  Weeks    Status  Achieved      PT SHORT TERM GOAL #5   Title  Patient will improve MMT by 1/2 grade to demonstrate increased strnehgt for more normalized gait and mobility to return to PLOF and work.     Baseline  03/18/18 - see MMT    Status  Achieved        PT Long Term Goals - 03/18/18 1641      PT LONG TERM GOAL #1   Title  AROM of Rt knee extension and flexion will improve to 0-110 degrees for more to allow more normalized gait.    Time  6    Period  Weeks    Status  On-going      PT LONG TERM GOAL #2   Title  Patient will be abel to perform SLS on Rt LE for 10 seconds  or greater to improve balance for increased safety with stairs and gait.    Baseline  03/18/18 - Bil LE = 13 seconds    Time  6    Period  Weeks    Status  Achieved      PT LONG TERM GOAL #3   Title  Patient will ambulate during 2MWT at 1.2 m/s with normalized gait pattern to demonstrate  decresaed risk of falling and improved community ambulation to return to [facility] and work.     Time  6    Period  Weeks    Status  On-going      PT LONG TERM GOAL #4   Title  Patient will ascend/descend 12x 6" stairs wtih step over step pattern and 1 hand rail or less to demonstrate safety and improved independence with mobility to return to PLOF.     Time  6    Period  Weeks    Status  On-going      PT LONG TERM GOAL #5   Title  Patient will improve MMT by 1 grade to demonstrate increased strnehgt for more normalized gait and mobility to return to PLOF and work.     Baseline  03/18/18 - see MMT    Time  6    Period  Weeks    Status  Achieved            Plan - 03/26/18 0951    Clinical Impression Statement  Continued with primary focus on Rt knee ROM.  Began working on heel toe gait as patient displays antalgia advancing LT LE with foot flat.  Able to progress quad stretch to chair height (approx 20 inches) and increased to 6" step for forward step downs.  completed manual to posterior knee/calf and hamstring this session as well as anterior knee.  Encouraged pateint to complete prone knee hang at home to work on extension.  PT with general tightness both anterior and posterior with tight bands of scar tissue mostly at distal anterior scar.   Ending ROM today, AROM 6-105 degrees.  109 degrees PROM.  Pt without return or increase in pain at end of session.      Rehab Potential  Fair    PT Frequency  3x / week    PT Duration  6 weeks    PT Treatment/Interventions  ADLs/Self Care Home Management;Aquatic Therapy;Cryotherapy;Electrical Stimulation;DME Instruction;Gait training;Stair training;Functional mobility training;Therapeutic activities;Therapeutic exercise;Balance training;Neuromuscular re-education;Patient/family education;Manual techniques;Scar mobilization;Passive range of motion;Energy conservation;Taping    PT Next Visit Plan  Focus on ROM and balance. Continue with ROM techniques  and exercises. Continue with quad stretch and STM in seated at end range flexion. Perform scar tissue mobilization and continue with joint mob with distraction for flexion glide. Continue with bike for AROM. Advance quad and hamstring strengthening, as patient is ready. Perform manual to improve quad extensibility.    PT Home Exercise Plan  Eval: quad set, seated AAROM for knee flexion; 02/25/18 - SAQ; 03/04/18 - knee flexino stretch on step, hamstring stretch on step;     Consulted and Agree with Plan of Care  Patient       Patient will benefit from skilled therapeutic intervention in order to improve the following deficits and impairments:  Abnormal gait, Decreased skin integrity, Increased fascial restricitons, Pain, Decreased scar mobility, Decreased mobility, Decreased activity tolerance, Decreased endurance, Decreased range of motion, Decreased strength, Hypomobility, Impaired flexibility, Difficulty walking, Decreased balance  Visit Diagnosis: Right knee pain, unspecified chronicity  Stiffness  of right knee, not elsewhere classified  Other abnormalities of gait and mobility  Muscle weakness (generalized)     Problem List Patient Active Problem List   Diagnosis Date Noted  . 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 [place], [place], [address] Phone: [phone]   Fax:  [phone]  Name: Melvin Turner MRN: [id number] Date of Birth: 1955-12-27

## 2018-03-30 ENCOUNTER — Ambulatory Visit (HOSPITAL_COMMUNITY): Payer: PRIVATE HEALTH INSURANCE

## 2018-03-30 DIAGNOSIS — M6281 Muscle weakness (generalized): Secondary | ICD-10-CM

## 2018-03-30 DIAGNOSIS — M25561 Pain in right knee: Secondary | ICD-10-CM | POA: Diagnosis not present

## 2018-03-30 DIAGNOSIS — R2689 Other abnormalities of gait and mobility: Secondary | ICD-10-CM

## 2018-03-30 DIAGNOSIS — M25661 Stiffness of right knee, not elsewhere classified: Secondary | ICD-10-CM

## 2018-03-30 NOTE — Therapy (Signed)
[facility] [address] [place], [place], [address] Phone: [phone]   Fax:  [phone]  Physical Therapy Treatment  Patient Details  Name: Melvin Turner MRN: [id number] Date of Birth: October 22, 1956 Referring Provider: Arther Abbott, MD   Encounter Date: 03/30/2018  PT End of Session - 03/30/18 0809    Visit Number  14    Number of Visits  19    Date for PT Re-Evaluation  04/07/18    Authorization Type  [employer] MULTIPLAN    Authorization Time Period  02/24/2018 - 04/07/2018     Authorization - Visit Number  4    Authorization - Number of Visits  10    PT Start Time  0814    PT Stop Time  0855    PT Time Calculation (min)  41 min    Activity Tolerance  Patient tolerated treatment well    Behavior During Therapy  [facility] for tasks assessed/performed       Past Medical History:  Diagnosis Date  . Arthritis   . BPH (benign prostatic hyperplasia)   . GERD (gastroesophageal reflux disease)   . Hypertension     Past Surgical History:  Procedure Laterality Date  . COLONOSCOPY    . KNEE ARTHROSCOPY Right 01/31/2015   Procedure: RIGHT KNEE ARTHROSCOPY, PARTIAL MEDIAL MENISECTOMY;  Surgeon: Sanjuana Kava, MD;  Location: [facility];  Service: Orthopedics;  Laterality: Right;  . TONSILLECTOMY    . TOTAL KNEE ARTHROPLASTY Right 02/03/2018   Procedure: TOTAL KNEE ARTHROPLASTY;  Surgeon: Carole Civil, MD;  Location: [facility];  Service: Orthopedics;  Laterality: Right;  Marland Kitchen VASECTOMY      There were no vitals filed for this visit.  Subjective Assessment - 03/30/18 0810    Subjective  Pt states that he is feeling better overall. He is still having mainly stiffness and only a little bit of pain.     Currently in Pain?  Yes    Pain Score  2     Pain Location  Knee    Pain Orientation  Right    Pain Descriptors / Indicators  Aching;Tightness    Pain Type  Chronic pain;Surgical pain    Pain Onset  More than a month ago    Pain Frequency   Constant    Aggravating Factors   bending and straightening     Pain Relieving Factors  ice and medicine    Effect of Pain on Daily Activities  slight increase           [facility] Adult PT Treatment/Exercise - 03/30/18 0001      Knee/Hip Exercises: Stretches   Active Hamstring Stretch  Right;3 reps;30 seconds;Limitations    Active Hamstring Stretch Limitations  12" box; AP glide on femur above knee joint    Knee: Self-Stretch to increase Flexion  Right;30 seconds;3 reps    Knee: Self-Stretch Limitations  12" box    Gastroc Stretch  Both;3 reps;30 seconds;Limitations    Gastroc Stretch Limitations  slant board      Knee/Hip Exercises: Aerobic   Stationary Bike  4 minutes on seat 15, half revolutions to improve right knee ROM      Knee/Hip Exercises: Standing   Terminal Knee Extension  Right;15 reps;Limitations    Terminal Knee Extension Limitations  small pink ball against wall x5" holds each    Lateral Step Up  Right;15 reps;Step Height: 8"    Forward Step Up  Right;15 reps;Step Height: 8"  Step Down  Right;15 reps;Step Height: 8"    Functional Squat  2 sets;15 reps;Limitations    Functional Squat Limitations  chair behind for form    SLS with Vectors  15 reps on Rt LE with 1 HHA     Other Standing Knee Exercises  tandem stance foam 5x10" BLE fwd    Other Standing Knee Exercises  fwd tandem gait 62f x2RT      Knee/Hip Exercises: Supine   Knee Extension Limitations  7    Knee Flexion Limitations  112      Knee/Hip Exercises: Prone   Hamstring Curl  20 reps    Contract/Relax to Increase Flexion  5x10" holds           PT Education - 03/30/18 0809    Education provided  Yes    Education Details  exercise technique, continue HEP    Person(s) Educated  Patient    Methods  Explanation;Demonstration    Comprehension  Verbalized understanding;Returned demonstration       PT Short Term Goals - 03/18/18 1308      PT SHORT TERM GOAL #1   Title  Patient will be  independent with HEP to improve functinoal strength and ROM to return to PLOF.    Baseline  03/18/18 - good compliance over first 3 weeks    Time  2    Period  Weeks    Status  Achieved      PT SHORT TERM GOAL #2   Title  AROM of Rt knee extension and flexion will improve by 8 degrees or more to allow more normalized gait.    Baseline  03/18/18 - ROM from 12-94 degrees (at evaluation 16-80)    Time  3    Period  Weeks    Status  Partially Met      PT SHORT TERM GOAL #3   Title  Patient will perform 5 time sit to stand in 14 seconds with no use of UE and no evidence of momentum to initiate stand.     Baseline  03/18/18 - 14 seconds     Time  3    Period  Weeks    Status  Achieved      PT SHORT TERM GOAL #4   Title  Patient will be able to perform SLS on Rt LE for 10 seconds or greater to improve balance for increased safety with stairs and gait.    Baseline  03/18/18 - Bil LE = 13 seconds    Time  3    Period  Weeks    Status  Achieved      PT SHORT TERM GOAL #5   Title  Patient will improve MMT by 1/2 grade to demonstrate increased strnehgt for more normalized gait and mobility to return to PLOF and work.     Baseline  03/18/18 - see MMT    Status  Achieved        PT Long Term Goals - 03/18/18 1641      PT LONG TERM GOAL #1   Title  AROM of Rt knee extension and flexion will improve to 0-110 degrees for more to allow more normalized gait.    Time  6    Period  Weeks    Status  On-going      PT LONG TERM GOAL #2   Title  Patient will be abel to perform SLS on Rt LE for 10 seconds or greater to improve balance for increased  safety with stairs and gait.    Baseline  03/18/18 - Bil LE = 13 seconds    Time  6    Period  Weeks    Status  Achieved      PT LONG TERM GOAL #3   Title  Patient will ambulate during 2MWT at 1.2 m/s with normalized gait pattern to demonstrate decresaed risk of falling and improved community ambulation to return to PLOF and work.     Time  6    Period   Weeks    Status  On-going      PT LONG TERM GOAL #4   Title  Patient will ascend/descend 12x 6" stairs wtih step over step pattern and 1 hand rail or less to demonstrate safety and improved independence with mobility to return to PLOF.     Time  6    Period  Weeks    Status  On-going      PT LONG TERM GOAL #5   Title  Patient will improve MMT by 1 grade to demonstrate increased strnehgt for more normalized gait and mobility to return to PLOF and work.     Baseline  03/18/18 - see MMT    Time  6    Period  Weeks    Status  Achieved            Plan - 03/30/18 0855    Clinical Impression Statement  Continued with established POC focusing on knee mobility and balance, while progressing functional strengthening some. Added tandem stance foam and tandem gait to progress balance; both of these were challenging to the pt. Added TKE with small ball against wall for quad strengthening and extension ROM and resumed contract relax for flexion ROM. Also added functional squats in standing for hip and quad strengthening. Pt tolerating session well throughout, only requiring min cues for proper technique. AROM 7 to 112deg this date.     Rehab Potential  Fair    PT Frequency  3x / week    PT Duration  6 weeks    PT Treatment/Interventions  ADLs/Self Care Home Management;Aquatic Therapy;Cryotherapy;Electrical Stimulation;DME Instruction;Gait training;Stair training;Functional mobility training;Therapeutic activities;Therapeutic exercise;Balance training;Neuromuscular re-education;Patient/family education;Manual techniques;Scar mobilization;Passive range of motion;Energy conservation;Taping    PT Next Visit Plan  Focus on ROM and balance. continue tandem balance and gait; Continue with ROM techniques and exercises. Continue with quad stretch and STM in seated at end range flexion. Perform scar tissue mobilization and continue with joint mob with distraction for flexion glide. Continue with bike for AROM.  Advance quad and hamstring strengthening, as patient is ready. Perform manual to improve quad extensibility.    PT Home Exercise Plan  Eval: quad set, seated AAROM for knee flexion; 02/25/18 - SAQ; 03/04/18 - knee flexino stretch on step, hamstring stretch on step;     Consulted and Agree with Plan of Care  Patient       Patient will benefit from skilled therapeutic intervention in order to improve the following deficits and impairments:  Abnormal gait, Decreased skin integrity, Increased fascial restricitons, Pain, Decreased scar mobility, Decreased mobility, Decreased activity tolerance, Decreased endurance, Decreased range of motion, Decreased strength, Hypomobility, Impaired flexibility, Difficulty walking, Decreased balance  Visit Diagnosis: Right knee pain, unspecified chronicity  Stiffness of right knee, not elsewhere classified  Other abnormalities of gait and mobility  Muscle weakness (generalized)     Problem List Patient Active Problem List   Diagnosis Date Noted  . 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 [place], [place], [address] Phone: [phone]   Fax:  [phone]  Name: Melvin Turner MRN: [id number] Date of Birth: 07-07-1956

## 2018-03-31 ENCOUNTER — Other Ambulatory Visit: Payer: Self-pay | Admitting: Orthopedic Surgery

## 2018-03-31 MED ORDER — HYDROCODONE-ACETAMINOPHEN 7.5-325 MG PO TABS: 1.0000 | ORAL_TABLET | Freq: Four times a day (QID) | ORAL | 0 refills | Status: DC | PRN

## 2018-03-31 NOTE — Telephone Encounter (Signed)
Patient called for refill: HYDROcodone-acetaminophen (NORCO) 7.5-325 MG tablet 42 tablet  - [facility]

## 2018-04-01 ENCOUNTER — Encounter (HOSPITAL_COMMUNITY): Payer: Self-pay | Admitting: Physical Therapy

## 2018-04-01 ENCOUNTER — Ambulatory Visit (HOSPITAL_COMMUNITY): Payer: PRIVATE HEALTH INSURANCE | Admitting: Physical Therapy

## 2018-04-01 DIAGNOSIS — R2689 Other abnormalities of gait and mobility: Secondary | ICD-10-CM

## 2018-04-01 DIAGNOSIS — M25561 Pain in right knee: Secondary | ICD-10-CM | POA: Diagnosis not present

## 2018-04-01 DIAGNOSIS — M6281 Muscle weakness (generalized): Secondary | ICD-10-CM

## 2018-04-01 DIAGNOSIS — M25661 Stiffness of right knee, not elsewhere classified: Secondary | ICD-10-CM

## 2018-04-01 NOTE — Therapy (Signed)
[place], [place], [address] Phone: [phone]   Fax:  [phone]  Physical Therapy Treatment  Patient Details  Name: Melvin Turner MRN: [id number] Date of Birth: 09-28-1956 Referring Provider: Arther Abbott, MD   Encounter Date: 04/01/2018  PT End of Session - 04/01/18 0905    Visit Number  15    Number of Visits  19    Date for PT Re-Evaluation  04/07/18    Authorization Type  [employer] M[employer]    Authorization Time Period  02/24/2018 - 04/07/2018     Authorization - Visit Number  5    Authorization - Number of Visits  10    PT Start Time  0814    PT Stop Time  0856    PT Time Calculation (min)  42 min    Activity Tolerance  Patient tolerated treatment well    Behavior During Therapy  [facility] for tasks assessed/performed       Past Medical History:  Diagnosis Date  . Arthritis   . BPH (benign prostatic hyperplasia)   . GERD (gastroesophageal reflux disease)   . Hypertension     Past Surgical History:  Procedure Laterality Date  . COLONOSCOPY    . KNEE ARTHROSCOPY Right 01/31/2015   Procedure: RIGHT KNEE ARTHROSCOPY, PARTIAL MEDIAL MENISECTOMY;  Surgeon: Sanjuana Kava, MD;  Location: [facility];  Service: Orthopedics;  Laterality: Right;  . TONSILLECTOMY    . TOTAL KNEE ARTHROPLASTY Right 02/03/2018   Procedure: TOTAL KNEE ARTHROPLASTY;  Surgeon: Carole Civil, MD;  Location: [facility];  Service: Orthopedics;  Laterality: Right;  Marland Kitchen VASECTOMY      There were no vitals filed for this visit.  Subjective Assessment - 04/01/18 0817    Subjective  Patient stated that his knee right knee has been bothering him at about a 4/10 pain. Patient stated that his back is bothering him at about a 5/10 pain, he stated he has talked to Dr. Aline Brochure about his back pain.     Currently in Pain?  Yes    Pain Score  4     Pain Location  Knee    Pain Orientation  Right    Pain Descriptors / Indicators  Aching;Tightness     Pain Type  Surgical pain;Chronic pain    Pain Onset  More than a month ago    Multiple Pain Sites  Yes    Pain Score  5    Pain Location  Back    Pain Orientation  Lower    Pain Descriptors / Indicators  Aching    Pain Type  Chronic pain    Pain Radiating Towards  hips    Pain Onset  More than a month ago    Pain Frequency  Intermittent                       [facility] Adult PT Treatment/Exercise - 04/01/18 0001      Knee/Hip Exercises: Stretches   Active Hamstring Stretch  Right;3 reps;30 seconds;Limitations    Active Hamstring Stretch Limitations  12" box; AP glide on femur above knee joint    Knee: Self-Stretch to increase Flexion  Right;30 seconds;3 reps    Knee: Self-Stretch Limitations  12" box    Gastroc Stretch  Both;3 reps;30 seconds;Limitations    Gastroc Stretch Limitations  slant board      Knee/Hip Exercises: Aerobic   Stationary Bike  4 minutes on seat 15,  half revolutions and some full revolutions with leaning compensation to improve right knee ROM      Knee/Hip Exercises: Standing   Forward Lunges  15 reps;Limitations;Right;1 set    Forward Lunges Limitations  BOSU, ball side up with shoulder flexion    Terminal Knee Extension  Right;15 reps;Limitations;2 sets    Terminal Knee Extension Limitations  small pink ball against wall x5" holds each    Lateral Step Up  Right;15 reps;Step Height: 8";Hand Hold: 1    Forward Step Up  Right;15 reps;Step Height: 8";Hand Hold: 1    Step Down  Right;15 reps;Step Height: 8";Hand Hold: 2    Functional Squat  2 sets;15 reps;Limitations    Functional Squat Limitations  chair behind for form    SLS with Vectors  15 reps on Rt LE with 1 HHA. Forward, side and back 5'' holds.     Other Standing Knee Exercises  tandem stance foam 5x10" BLE fwd      Knee/Hip Exercises: Supine   Knee Extension Limitations  7    Knee Flexion Limitations  114      Knee/Hip Exercises: Prone   Hamstring Curl  20 reps    Contract/Relax to  Increase Flexion  5x10" holds    Other Prone Exercises  Contract/Relax to Increase Extension 5x10" holds             PT Education - 04/01/18 0904    Education provided  Yes    Education Details  Patient was educated on exercise purpose and technique.     Person(s) Educated  Patient    Methods  Explanation;Verbal cues    Comprehension  Verbalized understanding;Returned demonstration       PT Short Term Goals - 03/18/18 1308      PT SHORT TERM GOAL #1   Title  Patient will be independent with HEP to improve functinoal strength and ROM to return to PLOF.    Baseline  03/18/18 - good compliance over first 3 weeks    Time  2    Period  Weeks    Status  Achieved      PT SHORT TERM GOAL #2   Title  AROM of Rt knee extension and flexion will improve by 8 degrees or more to allow more normalized gait.    Baseline  03/18/18 - ROM from 12-94 degrees (at evaluation 16-80)    Time  3    Period  Weeks    Status  Partially Met      PT SHORT TERM GOAL #3   Title  Patient will perform 5 time sit to stand in 14 seconds with no use of UE and no evidence of momentum to initiate stand.     Baseline  03/18/18 - 14 seconds     Time  3    Period  Weeks    Status  Achieved      PT SHORT TERM GOAL #4   Title  Patient will be able to perform SLS on Rt LE for 10 seconds or greater to improve balance for increased safety with stairs and gait.    Baseline  03/18/18 - Bil LE = 13 seconds    Time  3    Period  Weeks    Status  Achieved      PT SHORT TERM GOAL #5   Title  Patient will improve MMT by 1/2 grade to demonstrate increased strnehgt for more normalized gait and mobility to return to PLOF and work.  Baseline  03/18/18 - see MMT    Status  Achieved        PT Long Term Goals - 03/18/18 1641      PT LONG TERM GOAL #1   Title  AROM of Rt knee extension and flexion will improve to 0-110 degrees for more to allow more normalized gait.    Time  6    Period  Weeks    Status  On-going       PT LONG TERM GOAL #2   Title  Patient will be abel to perform SLS on Rt LE for 10 seconds or greater to improve balance for increased safety with stairs and gait.    Baseline  03/18/18 - Bil LE = 13 seconds    Time  6    Period  Weeks    Status  Achieved      PT LONG TERM GOAL #3   Title  Patient will ambulate during 2MWT at 1.2 m/s with normalized gait pattern to demonstrate decresaed risk of falling and improved community ambulation to return to [facility] and work.     Time  6    Period  Weeks    Status  On-going      PT LONG TERM GOAL #4   Title  Patient will ascend/descend 12x 6" stairs wtih step over step pattern and 1 hand rail or less to demonstrate safety and improved independence with mobility to return to PLOF.     Time  6    Period  Weeks    Status  On-going      PT LONG TERM GOAL #5   Title  Patient will improve MMT by 1 grade to demonstrate increased strnehgt for more normalized gait and mobility to return to PLOF and work.     Baseline  03/18/18 - see MMT    Time  6    Period  Weeks    Status  Achieved            Plan - 04/01/18 0906    Clinical Impression Statement  This session continued to focus on exercises to improve patient's right knee ROM and balance. This session progressed patient with forward lunges onto BOSU by incorporating upper extremity shoulder flexion. Patient continued to demonstrate limitations in both right knee flexion and extension ROM this session with extension measured as 7 degrees and flexion measured as 114 degrees. Plan to continue progression of range of motion and balance exercises.     Rehab Potential  Fair    PT Frequency  3x / week    PT Duration  6 weeks    PT Treatment/Interventions  ADLs/Self Care Home Management;Aquatic Therapy;Cryotherapy;Electrical Stimulation;DME Instruction;Gait training;Stair training;Functional mobility training;Therapeutic activities;Therapeutic exercise;Balance training;Neuromuscular re-education;Patient/family  education;Manual techniques;Scar mobilization;Passive range of motion;Energy conservation;Taping    PT Next Visit Plan  Focus on ROM and balance. continue tandem balance and gait; Continue with ROM techniques and exercises. Continue with quad stretch and STM in seated at end range flexion. Perform scar tissue mobilization and continue with joint mob with distraction for flexion glide. Continue with bike for AROM. Advance quad and hamstring strengthening, as patient is ready. Perform manual to improve quad extensibility.    PT Home Exercise Plan  Eval: quad set, seated AAROM for knee flexion; 02/25/18 - SAQ; 03/04/18 - knee flexino stretch on step, hamstring stretch on step;     Consulted and Agree with Plan of Care  Patient       Patient will  benefit from skilled therapeutic intervention in order to improve the following deficits and impairments:  Abnormal gait, Decreased skin integrity, Increased fascial restricitons, Pain, Decreased scar mobility, Decreased mobility, Decreased activity tolerance, Decreased endurance, Decreased range of motion, Decreased strength, Hypomobility, Impaired flexibility, Difficulty walking, Decreased balance  Visit Diagnosis: Right knee pain, unspecified chronicity  Stiffness of right knee, not elsewhere classified  Other abnormalities of gait and mobility  Muscle weakness (generalized)     Problem List Patient Active Problem List   Diagnosis Date Noted  . S/P total knee replacement, right 02/03/18    Clarene Critchley PT, DPT 9:09 AM, 04/01/18 [place], [place], [address] Phone: [phone]   Fax:  [phone]  Name: Melvin Turner MRN: [id number] Date of Birth: [birth year]

## 2018-04-03 ENCOUNTER — Encounter (HOSPITAL_COMMUNITY): Payer: Self-pay | Admitting: Physical Therapy

## 2018-04-03 ENCOUNTER — Ambulatory Visit (HOSPITAL_COMMUNITY): Payer: PRIVATE HEALTH INSURANCE | Admitting: Physical Therapy

## 2018-04-03 DIAGNOSIS — M6281 Muscle weakness (generalized): Secondary | ICD-10-CM

## 2018-04-03 DIAGNOSIS — R2689 Other abnormalities of gait and mobility: Secondary | ICD-10-CM

## 2018-04-03 DIAGNOSIS — M25561 Pain in right knee: Secondary | ICD-10-CM

## 2018-04-03 DIAGNOSIS — M25661 Stiffness of right knee, not elsewhere classified: Secondary | ICD-10-CM

## 2018-04-03 NOTE — Therapy (Signed)
[place], [place], [address] Phone: [phone]   Fax:  [phone]  Physical Therapy Treatment  Patient Details  Name: Melvin Turner MRN: [id number] Date of Birth: March 11, 1956 Referring Provider: Arther Abbott, MD   Encounter Date: 04/03/2018  PT End of Session - 04/03/18 0954    Visit Number  16    Number of Visits  19    Date for PT Re-Evaluation  04/07/18    Authorization Type  [employer] MULTIPLAN    Authorization Time Period  02/24/2018 - 04/07/2018     Authorization - Visit Number  6    Authorization - Number of Visits  10    PT Start Time  0814    PT Stop Time  0856    PT Time Calculation (min)  42 min    Activity Tolerance  Patient tolerated treatment well    Behavior During Therapy  [facility] for tasks assessed/performed       Past Medical History:  Diagnosis Date  . Arthritis   . BPH (benign prostatic hyperplasia)   . GERD (gastroesophageal reflux disease)   . Hypertension     Past Surgical History:  Procedure Laterality Date  . COLONOSCOPY    . KNEE ARTHROSCOPY Right 01/31/2015   Procedure: RIGHT KNEE ARTHROSCOPY, PARTIAL MEDIAL MENISECTOMY;  Surgeon: Sanjuana Kava, MD;  Location: [facility];  Service: Orthopedics;  Laterality: Right;  . TONSILLECTOMY    . TOTAL KNEE ARTHROPLASTY Right 02/03/2018   Procedure: TOTAL KNEE ARTHROPLASTY;  Surgeon: Carole Civil, MD;  Location: [facility];  Service: Orthopedics;  Laterality: Right;  Marland Kitchen VASECTOMY      There were no vitals filed for this visit.  Subjective Assessment - 04/03/18 0817    Subjective  Patient stated that his pain is similar to last session in his knee right knee with a pain of 4/10 pain and patient stated that his back is bothering him at about a 5/10 pain.    Currently in Pain?  Yes    Pain Score  4     Pain Location  Knee    Pain Orientation  Right    Pain Descriptors / Indicators  Aching;Tightness    Pain Type  Chronic pain;Surgical pain     Pain Onset  More than a month ago    Pain Score  4    Pain Location  Back    Pain Orientation  Lower    Pain Descriptors / Indicators  Aching    Pain Type  Chronic pain    Pain Onset  More than a month ago    Pain Frequency  Intermittent                       [facility] Adult PT Treatment/Exercise - 04/03/18 0001      Knee/Hip Exercises: Stretches   Active Hamstring Stretch  Right;3 reps;30 seconds;Limitations    Active Hamstring Stretch Limitations  12" box; AP glide on femur above knee joint    Knee: Self-Stretch to increase Flexion  Right;30 seconds;3 reps    Knee: Self-Stretch Limitations  12" box    Gastroc Stretch  Both;3 reps;30 seconds;Limitations    Gastroc Stretch Limitations  slant board      Knee/Hip Exercises: Standing   Forward Lunges  15 reps;Limitations;Right;1 set    Forward Lunges Limitations  BOSU, ball side up with shoulder flexion    Terminal Knee Extension  Right;15 reps;Limitations;2 sets  Terminal Knee Extension Limitations  Volley ball against wall 10 second holds    Lateral Step Up  Right;15 reps;Step Height: 8";Hand Hold: 1    Forward Step Up  Right;15 reps;Step Height: 8";Hand Hold: 1    Step Down  Right;15 reps;Step Height: 8";Hand Hold: 2    Functional Squat  2 sets;15 reps;Limitations    Rocker Board  2 minutes;Limitations    Rocker Board Limitations  2 minutes lateral for AROM    SLS with Vectors  15 reps on Rt LE with 1 HHA. Forward, side and back 5'' holds.     Other Standing Knee Exercises  tandem stance foam 5x10" BLE fwd      Knee/Hip Exercises: Supine   Knee Extension Limitations  7    Knee Flexion Limitations  110      Knee/Hip Exercises: Prone   Hamstring Curl  20 reps    Contract/Relax to Increase Flexion  10x10" holds    Other Prone Exercises  Contract/Relax to Increase Extension 10x10" holds      Manual Therapy   Manual Therapy  Soft tissue mobilization;Myofascial release    Manual therapy comments  completed  seperate from other skilled therapy    Soft tissue mobilization  to perimeter of knee and surrounding musculature to decrease tightness    Myofascial Release  to scar tissue to decrease adhesions             PT Education - 04/03/18 0953    Education provided  Yes    Education Details  Discussed purpose and technique of interventions and explained that there would be a re-assessment at next session.     Person(s) Educated  Patient    Methods  Explanation    Comprehension  Verbalized understanding       PT Short Term Goals - 03/18/18 1308      PT SHORT TERM GOAL #1   Title  Patient will be independent with HEP to improve functinoal strength and ROM to return to PLOF.    Baseline  03/18/18 - good compliance over first 3 weeks    Time  2    Period  Weeks    Status  Achieved      PT SHORT TERM GOAL #2   Title  AROM of Rt knee extension and flexion will improve by 8 degrees or more to allow more normalized gait.    Baseline  03/18/18 - ROM from 12-94 degrees (at evaluation 16-80)    Time  3    Period  Weeks    Status  Partially Met      PT SHORT TERM GOAL #3   Title  Patient will perform 5 time sit to stand in 14 seconds with no use of UE and no evidence of momentum to initiate stand.     Baseline  03/18/18 - 14 seconds     Time  3    Period  Weeks    Status  Achieved      PT SHORT TERM GOAL #4   Title  Patient will be able to perform SLS on Rt LE for 10 seconds or greater to improve balance for increased safety with stairs and gait.    Baseline  03/18/18 - Bil LE = 13 seconds    Time  3    Period  Weeks    Status  Achieved      PT SHORT TERM GOAL #5   Title  Patient will improve MMT by 1/2 grade  to demonstrate increased strnehgt for more normalized gait and mobility to return to PLOF and work.     Baseline  03/18/18 - see MMT    Status  Achieved        PT Long Term Goals - 03/18/18 1641      PT LONG TERM GOAL #1   Title  AROM of Rt knee extension and flexion will  improve to 0-110 degrees for more to allow more normalized gait.    Time  6    Period  Weeks    Status  On-going      PT LONG TERM GOAL #2   Title  Patient will be abel to perform SLS on Rt LE for 10 seconds or greater to improve balance for increased safety with stairs and gait.    Baseline  03/18/18 - Bil LE = 13 seconds    Time  6    Period  Weeks    Status  Achieved      PT LONG TERM GOAL #3   Title  Patient will ambulate during 2MWT at 1.2 m/s with normalized gait pattern to demonstrate decresaed risk of falling and improved community ambulation to return to [facility] and work.     Time  6    Period  Weeks    Status  On-going      PT LONG TERM GOAL #4   Title  Patient will ascend/descend 12x 6" stairs wtih step over step pattern and 1 hand rail or less to demonstrate safety and improved independence with mobility to return to PLOF.     Time  6    Period  Weeks    Status  On-going      PT LONG TERM GOAL #5   Title  Patient will improve MMT by 1 grade to demonstrate increased strnehgt for more normalized gait and mobility to return to PLOF and work.     Baseline  03/18/18 - see MMT    Time  6    Period  Weeks    Status  Achieved            Plan - 04/03/18 1000    Clinical Impression Statement  This session continued to work on exercises to improve patient's right knee range of motion and mobility as well as balance activities. This session increased repetitions with contract relax performance. This session noted increased stiffness in patient's right knee and a decrease in right knee flexion range of motion. Patient was not wearing his compression garment at this session and discussed that he should wear it. Next plan to re-assess patient.     Rehab Potential  Fair    PT Frequency  3x / week    PT Duration  6 weeks    PT Treatment/Interventions  ADLs/Self Care Home Management;Aquatic Therapy;Cryotherapy;Electrical Stimulation;DME Instruction;Gait training;Stair  training;Functional mobility training;Therapeutic activities;Therapeutic exercise;Balance training;Neuromuscular re-education;Patient/family education;Manual techniques;Scar mobilization;Passive range of motion;Energy conservation;Taping    PT Next Visit Plan  Re-assess next session. Focus on ROM and balance. continue tandem balance and gait; Continue with ROM techniques and exercises. Continue with quad stretch and STM in seated at end range flexion. Perform scar tissue mobilization and continue with joint mob with distraction for flexion glide. Continue with bike for AROM. Perform manual to improve quad extensibility.    PT Home Exercise Plan  Eval: quad set, seated AAROM for knee flexion; 02/25/18 - SAQ; 03/04/18 - knee flexino stretch on step, hamstring stretch on step;  Consulted and Agree with Plan of Care  Patient       Patient will benefit from skilled therapeutic intervention in order to improve the following deficits and impairments:  Abnormal gait, Decreased skin integrity, Increased fascial restricitons, Pain, Decreased scar mobility, Decreased mobility, Decreased activity tolerance, Decreased endurance, Decreased range of motion, Decreased strength, Hypomobility, Impaired flexibility, Difficulty walking, Decreased balance  Visit Diagnosis: Right knee pain, unspecified chronicity  Stiffness of right knee, not elsewhere classified  Other abnormalities of gait and mobility  Muscle weakness (generalized)     Problem List Patient Active Problem List   Diagnosis Date Noted  . S/P total knee replacement, right 02/03/18    Clarene Critchley PT, DPT 10:03 AM, 04/03/18 [place], [place], [address] Phone: [phone]   Fax:  [phone]  Name: Melvin Turner MRN: [id number] Date of Birth: 06-06-1956

## 2018-04-07 ENCOUNTER — Ambulatory Visit (HOSPITAL_COMMUNITY): Payer: PRIVATE HEALTH INSURANCE

## 2018-04-07 ENCOUNTER — Encounter (HOSPITAL_COMMUNITY): Payer: Self-pay

## 2018-04-07 ENCOUNTER — Other Ambulatory Visit: Payer: Self-pay

## 2018-04-07 DIAGNOSIS — M6281 Muscle weakness (generalized): Secondary | ICD-10-CM

## 2018-04-07 DIAGNOSIS — R2689 Other abnormalities of gait and mobility: Secondary | ICD-10-CM

## 2018-04-07 DIAGNOSIS — M25661 Stiffness of right knee, not elsewhere classified: Secondary | ICD-10-CM

## 2018-04-07 DIAGNOSIS — M25561 Pain in right knee: Secondary | ICD-10-CM

## 2018-04-07 NOTE — Therapy (Signed)
[facility] [address] [place], [place], [address] Phone: [phone]   Fax:  [phone]  Physical Therapy Treatment/Discharge Summary  Patient Details  Name: Melvin Turner MRN: [id number] Date of Birth: 09/13/1956 Referring Provider: Arther Abbott, MD   Encounter Date: 04/07/2018   Progress Note Reporting Period 03/18/18 to 04/07/18  See note below for Objective Data and Assessment of Progress/Goals.   PHYSICAL THERAPY DISCHARGE SUMMARY  Visits from Start of Care: 17  Current functional level related to goals / functional outcomes: Re-assessment performed this session and patient met 5/5 short term goals and 4/5 long term goals. His remaining limitation continues to be limited right knee flexion/extension and edema. He has been educated on importance of wearing compression garment for edema management and elevation and has been instructed on HEP to continue improving ROM independently at home. He has reported feeling confident he can continue independently and will be discharged following this session.   Remaining deficits: See below details.   Education / Equipment: Educated on progress towards goals and readiness for discharge. Educated new HEP exercises and improtance of wearing compression garment to prevent edem aform restricting ROM.   Plan: Patient agrees to discharge.  Patient goals were partially met. Patient is being discharged due to meeting the stated rehab goals.  ?????       PT End of Session - 04/07/18 0828    Visit Number  17    Number of Visits  19    Date for PT Re-Evaluation  04/07/18    Authorization Type  [employer] MULT[employer]LAN    Authorization Time Period  02/24/2018 - 04/07/2018     Authorization - Visit Number  7    Authorization - Number of Visits  10    PT Start Time  0818    PT Stop Time  6812    PT Time Calculation (min)  39 min    Activity Tolerance  Patient tolerated treatment well    Behavior During  Therapy  [facility] for tasks assessed/performed       Past Medical History:  Diagnosis Date  . Arthritis   . BPH (benign prostatic hyperplasia)   . GERD (gastroesophageal reflux disease)   . Hypertension     Past Surgical History:  Procedure Laterality Date  . COLONOSCOPY    . KNEE ARTHROSCOPY Right 01/31/2015   Procedure: RIGHT KNEE ARTHROSCOPY, PARTIAL MEDIAL MENISECTOMY;  Surgeon: Sanjuana Kava, MD;  Location: [facility];  Service: Orthopedics;  Laterality: Right;  . TONSILLECTOMY    . TOTAL KNEE ARTHROPLASTY Right 02/03/2018   Procedure: TOTAL KNEE ARTHROPLASTY;  Surgeon: Carole Civil, MD;  Location: [facility];  Service: Orthopedics;  Laterality: Right;  Marland Kitchen VASECTOMY      There were no vitals filed for this visit.  Subjective Assessment - 04/07/18 0819    Subjective  He reports he did not do his exercises this weekend because he was running around. He states after June 12th he will go back for work. Standing still and walking both knees hurt He is going to ask for his doctor to inject his Lt knee with cortisone. He is worried about giong up/down the stairs.    Limitations  Sitting;Standing;Walking;Lifting;House hold activities    Currently in Pain?  No/denies       [facility] PT Assessment - 04/07/18 0001      Assessment   Medical Diagnosis  Right TKA    Referring Provider  Arther Abbott, MD  Onset Date/Surgical Date  02/03/18    Next MD Visit  April 29, 2018      Precautions   Precautions  None      Restrictions   Weight Bearing Restrictions  No      Balance Screen   Has the patient fallen in the past 6 months  No      Prior Function   Level of Independence  Independent      Cognition   Overall Cognitive Status  Within Functional Limits for tasks assessed      Observation/Other Assessments   Focus on Therapeutic Outcomes (FOTO)   54% limited was 61 % limited on 02/24/18      Observation/Other Assessments-Edema    Edema  Circumferential      Circumferential Edema    Circumferential - Right  17.75 joint line    Circumferential - Left   16.75 joint line      Single Leg Stance   Comments  Lt LE = 15 seconds, Rt LE = 15 seconds      AROM   Right Knee Extension  7    Right Knee Flexion  108    Left Knee Extension  0    Left Knee Flexion  130      Strength   Overall Strength Comments  All 5/5 throughout Bil LE      Transfers   Five time sit to stand comments   -- 13.8 seconds on 03/18/18      Ambulation/Gait   Ambulation/Gait  Yes    Ambulation/Gait Assistance  7: Independent    Ambulation Distance (Feet)  540 Feet    Assistive device  None    Gait Pattern  Within Functional Limits    Ambulation Surface  Level    Gait velocity  1.37 m/s    Stairs  Yes    Stairs Assistance  7: Independent    Stair Management Technique  No rails;Alternating pattern;Forwards    Number of Stairs  16    Height of Stairs  6        [facility] Adult PT Treatment/Exercise - 04/07/18 0001      Knee/Hip Exercises: Standing   Rocker Board  2 minutes;Limitations    Rocker Board Limitations  2 minutes lateral for AROM       Balance Exercises - 04/07/18 0823      Balance Exercises: Standing   SLS with Vectors  Foam/compliant surface;Limitations 10 reps bil LE, 3 directions    Rockerboard  Lateral;Anterior/posterior;Other time (comment) 1 minute each to maintain midline balance        PT Education - 04/07/18 0903    Education provided  Yes    Education Details  Educated on progress towards goals and readiness for discharge. Educated new HEP exercises and improtance of wearing compression garment to prevent edem aform restricting ROM.    Person(s) Educated  Patient    Methods  Explanation;Handout    Comprehension  Verbalized understanding;Returned demonstration       PT Short Term Goals - 04/07/18 0827      PT SHORT TERM GOAL #1   Title  Patient will be independent with HEP to improve functinoal strength and ROM to return to PLOF.    Baseline  03/18/18 - good  compliance over first 3 weeks    Time  2    Period  Weeks    Status  Achieved      PT SHORT TERM GOAL #2   Title  AROM of Rt knee extension and flexion will improve by 8 degrees or more to allow more normalized gait.    Baseline  04/07/18 - 8-108 degrees    Time  3    Period  Weeks    Status  Achieved      PT SHORT TERM GOAL #3   Title  Patient will perform 5 time sit to stand in 14 seconds with no use of UE and no evidence of momentum to initiate stand.     Baseline  03/18/18 - 14 seconds     Time  3    Period  Weeks    Status  Achieved      PT SHORT TERM GOAL #4   Title  Patient will be able to perform SLS on Rt LE for 10 seconds or greater to improve balance for increased safety with stairs and gait.    Baseline  03/18/18 - Bil LE = 13 seconds    Time  3    Period  Weeks    Status  Achieved      PT SHORT TERM GOAL #5   Title  Patient will improve MMT by 1/2 grade to demonstrate increased strnehgt for more normalized gait and mobility to return to PLOF and work.     Baseline  03/18/18 - see MMT    Status  Achieved        PT Long Term Goals - 04/07/18 0827      PT LONG TERM GOAL #1   Title  AROM of Rt knee extension and flexion will improve to 0-110 degrees for more to allow more normalized gait.    Baseline  04/07/18 - 8-108 degrees    Time  6    Period  Weeks    Status  Not Met      PT LONG TERM GOAL #2   Title  Patient will be abel to perform SLS on Rt LE for 10 seconds or greater to improve balance for increased safety with stairs and gait.    Baseline  03/18/18 - Bil LE = 13 seconds    Time  6    Period  Weeks    Status  Achieved      PT LONG TERM GOAL #3   Title  Patient will ambulate during 2MWT at 1.2 m/s with normalized gait pattern to demonstrate decresaed risk of falling and improved community ambulation to return to [facility] and work.     Baseline  04/07/18 - 1.37 m/s in 2MWT    Time  6    Period  Weeks    Status  Achieved      PT LONG TERM GOAL #4   Title   Patient will ascend/descend 12x 6" stairs wtih step over step pattern and 1 hand rail or less to demonstrate safety and improved independence with mobility to return to PLOF.     Baseline  04/07/18 - 16x 6" stair with no hand rail alternating step pattern    Time  6    Period  Weeks    Status  Achieved      PT LONG TERM GOAL #5   Title  Patient will improve MMT by 1 grade to demonstrate increased strnehgt for more normalized gait and mobility to return to PLOF and work.     Baseline  03/18/18 - see MMT    Time  6    Period  Weeks    Status  Achieved  Plan - 04/07/18 0827    Clinical Impression Statement  Re-assessment performed this session and patient met 5/5 short term goals and 4/5 long term goals. His remaining limitation continues to be limited right knee flexion/extension and edema. He has been educated on importance of wearing compression garment for edema management and elevation and has been instructed on HEP to continue improving ROM independently at home. He has reported feeling confident he can continue independently and will be discharged following this session.    Rehab Potential  Fair    PT Frequency  3x / week    PT Duration  6 weeks    PT Treatment/Interventions  ADLs/Self Care Home Management;Aquatic Therapy;Cryotherapy;Electrical Stimulation;DME Instruction;Gait training;Stair training;Functional mobility training;Therapeutic activities;Therapeutic exercise;Balance training;Neuromuscular re-education;Patient/family education;Manual techniques;Scar mobilization;Passive range of motion;Energy conservation;Taping    PT Next Visit Plan  Discharge this session    PT Home Exercise Plan  Eval: quad set, seated AAROM for knee flexion; 02/25/18 - SAQ; 03/04/18 - knee flexino stretch on step, hamstring stretch on step; contract relax stretch hamstring/quad    Consulted and Agree with Plan of Care  Patient       Patient will benefit from skilled therapeutic intervention in order  to improve the following deficits and impairments:  Abnormal gait, Decreased skin integrity, Increased fascial restricitons, Pain, Decreased scar mobility, Decreased mobility, Decreased activity tolerance, Decreased endurance, Decreased range of motion, Decreased strength, Hypomobility, Impaired flexibility, Difficulty walking, Decreased balance  Visit Diagnosis: Right knee pain, unspecified chronicity  Stiffness of right knee, not elsewhere classified  Other abnormalities of gait and mobility  Muscle weakness (generalized)     Problem List Patient Active Problem List   Diagnosis Date Noted  . S/P total knee replacement, right 02/03/18     Melvin Turner, PT, DPT Physical Therapist with [facility]  04/07/2018 9:41 AM    [place], [place], [address] Phone: [phone]   Fax:  [phone]  Name: Melvin Turner MRN: [id number] Date of Birth: 1956-02-26

## 2018-04-07 NOTE — Patient Instructions (Signed)
Supine Hamstring Stretch with Strap REPS: 3  SETS: 1-3  WEEKLY: 7x  DAILY: 1x Setup Begin lying on your back with your legs straight, holding the end of a strap that is looped around one foot. Movement Use the strap to pull your leg up toward your body until you feel a gentle stretch in the back of your upper leg. Hold this position. While holding this position activate your hamstring like you are trying to bend you knee, but do not allow your foot to move towards the mat (hold this for 5 seconds) Then pull using the strap pull your leg further up toward your body until you feel a slight increase in the stretch in the back of your upper leg. (hold for 30 seconds) Repeat. Tip Make sure to keep your other leg straight on the ground during the stretch. STEP 1 STEP 2 Prone Quadriceps Stretch with Strap REPS: 3  SETS: 1-3  WEEKLY: 7x  DAILY: 1x Setup Begin lying on your front with your legs straight, holding the end of a strap that is looped around one foot. Movement Pull the end of the strap over your shoulder on the same side of your body, bending your knee, until you feel a gentle stretch in your thigh. While holding this position activate your quad like you are trying to kick your foot to the mat, but do not allow your foot to move towards the mat (hold this for 5 seconds) Then pull using the strap pull your leg further bending your knee, until you feel a slight increase in the stretch in your thigh. (hold for 30 seconds)

## 2018-04-14 ENCOUNTER — Other Ambulatory Visit: Payer: Self-pay | Admitting: Orthopedic Surgery

## 2018-04-14 NOTE — Telephone Encounter (Signed)
Patient requests refill on Hydrocodone/Acetaminophen 7.5-325  Mgs.   Qty  28  Sig: Take 1 tablet by mouth every 6 (six) hours as needed for moderate pain.  Patient states he uses Psych[facility], forensic

## 2018-04-16 ENCOUNTER — Other Ambulatory Visit: Payer: Self-pay | Admitting: Orthopedic Surgery

## 2018-04-16 ENCOUNTER — Telehealth: Payer: Self-pay | Admitting: Radiology

## 2018-04-16 MED ORDER — HYDROCODONE-ACETAMINOPHEN 5-325 MG PO TABS: 1.0000 | ORAL_TABLET | Freq: Four times a day (QID) | ORAL | 0 refills | Status: AC | PRN

## 2018-04-16 NOTE — Telephone Encounter (Signed)
Did you get a refill request for the Hydrocodone? I thought I sent it, did not want to duplicate it. Patient has called again

## 2018-04-16 NOTE — Progress Notes (Unsigned)
norco

## 2018-04-29 ENCOUNTER — Ambulatory Visit (INDEPENDENT_AMBULATORY_CARE_PROVIDER_SITE_OTHER): Payer: No Typology Code available for payment source | Admitting: Orthopedic Surgery

## 2018-04-29 ENCOUNTER — Encounter: Payer: Self-pay | Admitting: Orthopedic Surgery

## 2018-04-29 ENCOUNTER — Other Ambulatory Visit: Payer: Self-pay | Admitting: Radiology

## 2018-04-29 VITALS — BP 153/81 | HR 86 | Ht 70.0 in | Wt 232.0 lb

## 2018-04-29 DIAGNOSIS — Z96651 Presence of right artificial knee joint: Secondary | ICD-10-CM

## 2018-04-29 DIAGNOSIS — M25561 Pain in right knee: Secondary | ICD-10-CM | POA: Diagnosis not present

## 2018-04-29 DIAGNOSIS — G8929 Other chronic pain: Secondary | ICD-10-CM | POA: Diagnosis not present

## 2018-04-29 DIAGNOSIS — M545 Low back pain, unspecified: Secondary | ICD-10-CM

## 2018-04-29 DIAGNOSIS — M5442 Lumbago with sciatica, left side: Secondary | ICD-10-CM

## 2018-04-29 DIAGNOSIS — M25562 Pain in left knee: Secondary | ICD-10-CM

## 2018-04-29 MED ORDER — METHOCARBAMOL 500 MG PO TABS: 500.0000 mg | ORAL_TABLET | Freq: Four times a day (QID) | ORAL | 2 refills | Status: DC | PRN

## 2018-04-29 MED ORDER — HYDROCODONE-ACETAMINOPHEN 5-325 MG PO TABS: 1.0000 | ORAL_TABLET | Freq: Four times a day (QID) | ORAL | 0 refills | Status: DC | PRN

## 2018-04-29 NOTE — Telephone Encounter (Signed)
-----   Message from Doristine Sectionarol A Foltz sent at 04/29/2018  4:34 PM EDT ----- Regarding: Rx review Amy -  RE: Laqueta CarinaANES, Antionio F [[phone]] - please advise regarding [facility] [place]  Thank you

## 2018-04-29 NOTE — Telephone Encounter (Signed)
I resent Methocarbamol But can not send Hydrocodone  Pharmacy still does not have it can you resend?

## 2018-04-29 NOTE — Progress Notes (Signed)
Postop visit  Chief Complaint  Patient presents with  . Knee Pain    right knee painful s/p TKR 02/03/18    Patient continues to complain of pain in his right knee after total knee surgery about 3 months ago on February 03, 2018.  He complains of pain at night he complains of pain in the front of his knee when he places it into extreme flexion which is approximately 118 degrees.  He has a slight lack of extension of about 3 degrees.  He complains of pain in his left knee as well.  He complains of chronic lower back pain.  He was told 10 years ago he had a herniated disc but that he should wait on surgery which he did.  As we discussed in his preoperative evaluation he was at high risk to have continued pain after surgery because of his chronic lower back issues.  He denies any fever.  Right knee flexion arc 115 degrees 3-118 degrees.  There are no signs of infection his incision looks good he has no calf swelling or peripheral edema  Impression stable well-functioning right total knee with pain most likely coming from degenerative disc disease in his lower back  New problem  Chief complaint pain left knee   History: 62 year old male with chronic left knee pain, no trauma history of dull aching pain    Review of systems no catching locking or giving way chronic aching pain left knee  BP (!) 153/81   Pulse 86   Ht 5\' 10"  (1.778 m)   Wt 232 lb (105.2 kg)   BMI 33.29 kg/m   Exam well-developed well-nourished male grooming hygiene intact he is awake alert and oriented x3.  He has tenderness over his medial joint line no effusion he can flex his knee to 120 degrees.  His knee feels stable.  Encounter Diagnoses  Name Primary?  . 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 Chronic midline low back pain without sciatica Yes  . Bilateral chronic knee pain   . Chronic left-sided low back pain with left-sided sciatica     Plan patient has a stable right total knee there is no  issues that I can see there.  Follow-up 9 months for his annual x-ray  Chronic lower back pain patient will talk to primary care doctor about referring him back to Dr. Channing Muttersoy or neurosurgery  Left knee pain Left knee injection Procedure note left knee injection verbal consent was obtained to inject left knee joint  Timeout was completed to confirm the site of injection  The medications used were 40 mg of Depo-Medrol and 1% lidocaine 3 cc  Anesthesia was provided by ethyl chloride and the skin was prepped with alcohol.  After cleaning the skin with alcohol a 20-gauge needle was used to inject the left knee joint. There were no complications. A sterile bandage was applied.

## 2018-04-30 MED ORDER — HYDROCODONE-ACETAMINOPHEN 5-325 MG PO TABS: 1.0000 | ORAL_TABLET | Freq: Four times a day (QID) | ORAL | 0 refills | Status: AC | PRN

## 2018-06-15 ENCOUNTER — Telehealth: Payer: Self-pay | Admitting: Orthopedic Surgery

## 2018-06-15 NOTE — Telephone Encounter (Signed)
Patient came to office following visit to provider Melvin HackerJanelle Grossman[facility]. States she would like to have Dr Melvin Turner's input regarding:  (1) increase of Hydrodocone from 7.5 to 10/325, 4 times per day / Baclofen 10 mg and Meloxicam 15 mg 1 time per day  (2) asking about possible nerve block  (3) also asking about adding other medication   - patient's next appointment is due 07/09/18 Her phone # is [phone], Ext# [phone]. Please advise.

## 2018-08-12 ENCOUNTER — Encounter: Payer: Self-pay | Admitting: Orthopaedic Surgery

## 2018-08-12 ENCOUNTER — Ambulatory Visit: Payer: No Typology Code available for payment source | Admitting: Orthopaedic Surgery

## 2018-08-12 VITALS — BP 133/74 | HR 93 | Ht 70.0 in | Wt 229.0 lb

## 2018-08-12 DIAGNOSIS — G8929 Other chronic pain: Secondary | ICD-10-CM | POA: Diagnosis not present

## 2018-08-12 DIAGNOSIS — M25511 Pain in right shoulder: Secondary | ICD-10-CM | POA: Diagnosis not present

## 2018-08-12 NOTE — Progress Notes (Signed)
Melvin Turner, male DOB:1956-04-07, 62 y.o. EAV:[id number]  Chief Complaint  Melvin presents with  . Shoulder Pain    Righ shoulder pain getting bad over past month. Had a recent fall    HPI  Melvin Turner is a 62 y.o. male who fell about four to six weeks ago and hurt his right shoulder.  It has been bothering him since then.  He has full motion and no swelling but it hurts when he raises his arm over his head.  He has no numbness, no redness.  He has no pain in his knees today.   Body mass index is 32.86 kg/m.  ROS  Review of Systems  Constitutional:       Melvin does not have Diabetes Mellitus. Melvin does not have hypertension. Melvin does not have COPD or shortness of breath. Melvin does not have BMI > 35. Melvin does not have current smoking history.  HENT: Negative for congestion.   Respiratory: Negative for cough and shortness of breath.   Cardiovascular: Negative for chest pain.  Endocrine: Positive for cold intolerance.  Musculoskeletal: Positive for arthralgias, gait problem, joint swelling and myalgias.  Allergic/Immunologic: Negative for environmental allergies.    All other systems reviewed and are negative.  The following is a summary of the past history medically, past history surgically, known current medicines, social history and family history.  This information is gathered electronically by the computer from prior information and documentation.  I review this each visit and have found including this information at this point in the chart is beneficial and informative.    Past Medical History:  Diagnosis Date  . Arthritis   . BPH (benign prostatic hyperplasia)   . GERD (gastroesophageal reflux disease)   . Hypertension     Past Surgical History:  Procedure Laterality Date  . COLONOSCOPY    . KNEE ARTHROSCOPY Right 01/31/2015   Procedure: RIGHT KNEE ARTHROSCOPY, PARTIAL MEDIAL MENISECTOMY;  Surgeon: Darreld Mclean, MD;  Location: [facility];   Service: Orthopedics;  Laterality: Right;  . TONSILLECTOMY    . TOTAL KNEE ARTHROPLASTY Right 02/03/2018   Procedure: TOTAL KNEE ARTHROPLASTY;  Surgeon: Vickki Hearing, MD;  Location: [facility];  Service: Orthopedics;  Laterality: Right;  Marland Kitchen VASECTOMY      Family History  Problem Relation Age of Onset  . Heart attack Son     Social History Social History   Tobacco Use  . Smoking status: Former Smoker    Packs/day: 1.00    Years: 30.00    Pack years: 30.00    Types: Cigarettes    Last attempt to quit: 01/29/2006    Years since quitting: 12.5  . Smokeless tobacco: Never Used  Substance Use Topics  . Alcohol use: Yes    Comment: occ  . Drug use: No    Allergies  Allergen Reactions  . Poison Ivy Extract [Poison Ivy Extract] Hives and Rash    Current Outpatient Medications  Medication Sig Dispense Refill  . amLODipine (NORVASC) 5 MG tablet     . Ascorbic Acid (VITAMIN C CR) 1000 MG TBCR Take 1 tablet by mouth daily.    Marland Kitchen aspirin 81 MG chewable tablet Chew 1 tablet (81 mg total) by mouth 2 (two) times daily. (Melvin not taking: Reported on 04/29/2018) 60 tablet 0  . benazepril (LOTENSIN) 20 MG tablet Take 20 mg by mouth daily.    Marland Kitchen gabapentin (NEURONTIN) 100 MG capsule Take 1 capsule (100 mg total) by mouth 3 (  three) times daily. 90 capsule 2  . L-ARGININE-500 PO Take 1 tablet by mouth daily.     . methocarbamol (ROBAXIN) 500 MG tablet Take 1 tablet (500 mg total) by mouth every 6 (six) hours as needed for muscle spasms. 28 tablet 2  . Multiple Vitamin (MULTIVITAMIN WITH MINERALS) TABS tablet Take 1 tablet by mouth daily.    Marland Kitchen. omeprazole (PRILOSEC) 20 MG capsule     . tamsulosin (FLOMAX) 0.4 MG CAPS capsule Take 0.4 mg by mouth.     No current facility-administered medications for this visit.      Physical Exam  Blood pressure 133/74, pulse 93, height 5\' 10"  (1.778 m), weight 229 lb (103.9 kg).  Constitutional: overall normal hygiene, normal nutrition, well developed,  normal grooming, normal body habitus. Assistive device:none  Musculoskeletal: gait and station Limp none, muscle tone and strength are normal, no tremors or atrophy is present.  .  Neurological: coordination overall normal.  Deep tendon reflex/nerve stretch intact.  Sensation normal.  Cranial nerves II-XII intact.   Skin:   Normal overall no scars, lesions, ulcers or rashes. No psoriasis.  Psychiatric: Alert and oriented x 3.  Recent memory intact, remote memory unclear.  Normal mood and affect. Well groomed.  Good eye contact.  Cardiovascular: overall no swelling, no varicosities, no edema bilaterally, normal temperatures of the legs and arms, no clubbing, cyanosis and good capillary refill.  Lymphatic: palpation is normal.  Right shoulder has full motion but tender in the extremes.  NV intact.  Grips normal.  ROM neck is full.  All other systems reviewed and are negative   The Melvin has been educated about the nature of the problem(s) and counseled on treatment options.  The Melvin appeared to understand what I have discussed and is in agreement with it.  Encounter Diagnosis  Name Primary?  . Chronic right shoulder pain Yes   PROCEDURE NOTE:  The Melvin request injection, verbal consent was obtained.  The right shoulder was prepped appropriately after time out was performed.   Sterile technique was observed and injection of 1 cc of Depo-Medrol 40 mg with several cc's of plain xylocaine. Anesthesia was provided by ethyl chloride and a 20-gauge needle was used to inject the shoulder area. A posterior approach was used.  The injection was tolerated well.  A band aid dressing was applied.  The Melvin was advised to apply ice later today and tomorrow to the injection sight as needed.   PLAN Call if any problems.  Precautions discussed.  Continue current medications.   Return to clinic prn   Electronically Signed Darreld McleanWayne Abigail Marsiglia, MD 9/25/20191:54 PM

## 2018-12-17 ENCOUNTER — Encounter: Payer: Self-pay | Admitting: Orthopedic Surgery

## 2018-12-17 ENCOUNTER — Ambulatory Visit (INDEPENDENT_AMBULATORY_CARE_PROVIDER_SITE_OTHER): Payer: No Typology Code available for payment source

## 2018-12-17 ENCOUNTER — Ambulatory Visit: Payer: No Typology Code available for payment source | Admitting: Orthopedic Surgery

## 2018-12-17 VITALS — BP 134/79 | HR 89 | Ht 70.0 in | Wt 229.0 lb

## 2018-12-17 DIAGNOSIS — G8929 Other chronic pain: Secondary | ICD-10-CM

## 2018-12-17 DIAGNOSIS — M25551 Pain in right hip: Secondary | ICD-10-CM

## 2018-12-17 DIAGNOSIS — Z96651 Presence of right artificial knee joint: Secondary | ICD-10-CM

## 2018-12-17 DIAGNOSIS — M25561 Pain in right knee: Secondary | ICD-10-CM | POA: Diagnosis not present

## 2018-12-17 NOTE — Progress Notes (Signed)
OFFICE VISIT  Chief Complaint  Patient presents with  . Leg Pain    right thigh into back of right leg/ both legs at times   . Knee Pain    has some pain with flexion, feels numb at incison Rgith TKR 02/03/18    This is a 63 year old male who comes in earlier than his scheduled March appointment for 1 year follow-up after right total knee complaining of right thigh pain which radiates to his right knee.  He also has bilateral leg pain and lower back pain with a history of prior MRI showing he had some degenerative disc disease he seen Dr. Shelda Altesoy [place] and no surgery was recommended but that was many years ago  The patient says that he lies down in bed at night and has severe thigh pain he gives a history that he has an elevated PSA level and is about to have work-up for that.  He does tell us that the part that we did surgery for referring to the knee replacement is super.  He says at times it is hard to bend it especially when it is painful and he has some difficulty going up and down the stairs.  He says the pain at night is severe and it keeps him from going to sleep and it wakes him up if he does get some sleep.  No swelling is noted in the knee he seems to walk on it fine no change in the symptoms over the last month or so   Review of Systems  Constitutional: Positive for malaise/fatigue. Negative for chills, fever and weight loss.       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       He has a symptomatic left knee with arthritis had an injection the last time he was in the office  Neurological: Positive for tingling. Negative for weakness.     Past Medical History:  Diagnosis Date  . Arthritis   . BPH (benign prostatic hyperplasia)   . GERD (gastroesophageal reflux disease)   . Hypertension     Past Surgical History:  Procedure Laterality Date  .  COLONOSCOPY    . KNEE ARTHROSCOPY Right 01/31/2015   Procedure: RIGHT KNEE ARTHROSCOPY, PARTIAL MEDIAL MENISECTOMY;  Surgeon: Darreld McleanWayne Keeling, MD;  Location: [facility];  Service: Orthopedics;  Laterality: Right;  . TONSILLECTOMY    . TOTAL KNEE ARTHROPLASTY Right 02/03/2018   Procedure: TOTAL KNEE ARTHROPLASTY;  Surgeon: Vickki HearingHarrison, Jacyln Carmer E, MD;  Location: [facility];  Service: Orthopedics;  Laterality: Right;  Marland Kitchen. VASECTOMY      Family History  Problem Relation Age of Onset  . Heart attack Son    Social History   Tobacco Use  . Smoking status: Former Smoker    Packs/day: 1.00    Years: 30.00    Pack years: 30.00    Types: Cigarettes    Last attempt to quit: 01/29/2006    Years since quitting: 12.8  . Smokeless tobacco: Never Used  Substance Use Topics  . Alcohol use: Yes    Comment: occ  . Drug use: No    Allergies  Allergen Reactions  . Poison Ivy Extract [Poison Ivy Extract] Hives and Rash    Current Meds  Medication Sig  . amLODipine (NORVASC) 5 MG tablet   . Ascorbic Acid (VITAMIN C CR) 1000  MG TBCR Take 1 tablet by mouth daily.  . baclofen (LIORESAL) 10 MG tablet Take 10 mg by mouth 3 (three) times daily as needed.  . benazepril (LOTENSIN) 20 MG tablet Take 20 mg by mouth daily.  Marland Kitchen. HYDROcodone-acetaminophen (NORCO) 10-325 MG tablet TAKE 1 TABLET BY MOUTH 4 TIMES DAILY AS NEEDED  . L-ARGININE-500 PO Take 1 tablet by mouth daily.   . Multiple Vitamin (MULTIVITAMIN WITH MINERALS) TABS tablet Take 1 tablet by mouth daily.  Marland Kitchen. omeprazole (PRILOSEC) 20 MG capsule   . tamsulosin (FLOMAX) 0.4 MG CAPS capsule Take 0.4 mg by mouth.    BP 134/79   Pulse 89   Ht 5\' 10"  (1.778 m)   Wt 229 lb (103.9 kg)   BMI 32.86 kg/m   Physical Exam  Ortho Exam    MEDICAL DECISION SECTION  Xrays were done at [facility]  My independent reading of xrays:  [facility] lateral right knee, tibial and femoral components are well aligned with no loosening the patella is not resurfaced  Encounter Diagnoses   Name Primary?  . 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 Pain in joint involving right pelvic region and thigh   . Chronic pain of right knee Yes    PLAN: (Rx., injectx, surgery, frx, mri/ct) Unclear as to why the patient is having pain at this time.  Everything looks fine and his knee looks good on x-ray it is functioning well on exam there are no signs of infection  I told him to come back at his scheduled appointment in March of 1 to get an x-ray of his femur especially in light of this elevated PSA and having this bone pain with no Explainable cause No orders of the defined types were placed in this encounter.   Melvin CanadaStanley Aundray Cartlidge, MD  12/17/2018 2:24 PM

## 2019-01-22 ENCOUNTER — Ambulatory Visit: Payer: No Typology Code available for payment source | Admitting: Urology

## 2019-01-22 DIAGNOSIS — R3912 Poor urinary stream: Secondary | ICD-10-CM | POA: Diagnosis not present

## 2019-01-22 DIAGNOSIS — R972 Elevated prostate specific antigen [PSA]: Secondary | ICD-10-CM

## 2019-01-22 DIAGNOSIS — N401 Enlarged prostate with lower urinary tract symptoms: Secondary | ICD-10-CM | POA: Diagnosis not present

## 2019-01-27 ENCOUNTER — Encounter: Payer: Self-pay | Admitting: Orthopedic Surgery

## 2019-01-27 ENCOUNTER — Other Ambulatory Visit: Payer: Self-pay

## 2019-01-27 ENCOUNTER — Ambulatory Visit (INDEPENDENT_AMBULATORY_CARE_PROVIDER_SITE_OTHER): Payer: No Typology Code available for payment source

## 2019-01-27 ENCOUNTER — Ambulatory Visit: Payer: No Typology Code available for payment source | Admitting: Orthopedic Surgery

## 2019-01-27 VITALS — BP 141/89 | HR 80 | Ht 71.0 in | Wt 225.0 lb

## 2019-01-27 DIAGNOSIS — M1712 Unilateral primary osteoarthritis, left knee: Secondary | ICD-10-CM | POA: Diagnosis not present

## 2019-01-27 DIAGNOSIS — G8929 Other chronic pain: Secondary | ICD-10-CM | POA: Diagnosis not present

## 2019-01-27 DIAGNOSIS — M25562 Pain in left knee: Secondary | ICD-10-CM

## 2019-01-27 DIAGNOSIS — M25551 Pain in right hip: Secondary | ICD-10-CM

## 2019-01-27 NOTE — Progress Notes (Signed)
ESTABLISHED PATIENT NEW PROBLEM OFFICE VISIT  Chief Complaint  Patient presents with  . Knee Pain    left knee painful   . Post-op Follow-up    right knee still numb below knee capy history of Right total knee replacement 02/03/18     62 years old right total knee March 2019 complained of persistent right leg pain came in today for right femur x-ray.  However, complains of pain in his left knee Dull pain left knee  Diffuse  Severe Assoc with aching and stifness   Right leg hurts entire leg, right tka great for weightbearing   Review of Systems  Constitutional: Negative for weight loss.  Musculoskeletal: Positive for back pain.  Neurological: Negative for tremors.     Past Medical History:  Diagnosis Date  . Arthritis   . BPH (benign prostatic hyperplasia)   . GERD (gastroesophageal reflux disease)   . Hypertension     Past Surgical History:  Procedure Laterality Date  . COLONOSCOPY    . KNEE ARTHROSCOPY Right 01/31/2015   Procedure: RIGHT KNEE ARTHROSCOPY, PARTIAL MEDIAL MENISECTOMY;  Surgeon: Darreld Mclean, MD;  Location: [facility];  Service: Orthopedics;  Laterality: Right;  . TONSILLECTOMY    . TOTAL KNEE ARTHROPLASTY Right 02/03/2018   Procedure: TOTAL KNEE ARTHROPLASTY;  Surgeon: Vickki Hearing, MD;  Location: [facility];  Service: Orthopedics;  Laterality: Right;  Marland Kitchen VASECTOMY      Family History  Problem Relation Age of Onset  . Heart attack Son    Social History   Tobacco Use  . Smoking status: Former Smoker    Packs/day: 1.00    Years: 30.00    Pack years: 30.00    Types: Cigarettes    Last attempt to quit: 01/29/2006    Years since quitting: 13.0  . Smokeless tobacco: Never Used  Substance Use Topics  . Alcohol use: Yes    Comment: occ  . Drug use: No    Allergies  Allergen Reactions  . Poison Ivy Extract [Poison Ivy Extract] Hives and Rash    Current Meds  Medication Sig  . amLODipine (NORVASC) 5 MG tablet   . Ascorbic Acid (VITAMIN C  CR) 1000 MG TBCR Take 1 tablet by mouth daily.  . baclofen (LIORESAL) 10 MG tablet Take 10 mg by mouth 3 (three) times daily as needed.  . benazepril (LOTENSIN) 20 MG tablet Take 20 mg by mouth daily.  . L-ARGININE-500 PO Take 1 tablet by mouth daily.   . Multiple Vitamin (MULTIVITAMIN WITH MINERALS) TABS tablet Take 1 tablet by mouth daily.  Marland Kitchen omeprazole (PRILOSEC) 20 MG capsule   . tamsulosin (FLOMAX) 0.4 MG CAPS capsule Take 0.4 mg by mouth.    BP (!) 141/89   Pulse 80   Ht 5\' 11"  (1.803 m)   Wt 225 lb (102.1 kg)   BMI 31.38 kg/m   Physical Exam Skin:    General: Skin is warm and dry.  Neurological:     General: No focal deficit present.     Mental Status: He is alert and oriented to person, place, and time.  Psychiatric:        Mood and Affect: Mood normal.        Behavior: Behavior normal.        Thought Content: Thought content normal.        Judgment: Judgment normal.     Ortho Exam  Right knee  Midline incision from his total knee Flexion 115 degrees full extension  No instability No effusion or palpable tenderness Muscle tone and strength are normal  Left knee medial joint line tenderness no effusion Flexion 120 degrees Ligaments stable Muscle tone and strength normal    MEDICAL DECISION SECTION  Xrays were done at [facility] pelvis showed arthritis mild both hips and L5-S1 joint space narrowing with large osteophyte right side  Right femur was normal  Left knee: Moderate varus severe arthritis medial compartment left knee    Encounter Diagnoses  Name Primary?  . Right hip pain Yes  . Chronic pain of left knee     PLAN:  Procedure note left knee injection verbal consent was obtained to inject left knee joint  Timeout was completed to confirm the site of injection  The medications used were 40 mg of Depo-Medrol and 1% lidocaine 3 cc  Anesthesia was provided by ethyl chloride and the skin was prepped with alcohol.  After cleaning the skin with  alcohol a 20-gauge needle was used to inject the left knee joint. There were no complications. A sterile bandage was applied.   Going to follow-up as needed, he needs a knee replacement on the left  He needs a work-up for his chronic back pain and chronic bilateral leg pain and osteoarthritis seen on x-ray No orders of the defined types were placed in this encounter.   Fuller Canada, MD  01/27/2019 2:50 PM

## 2019-02-01 ENCOUNTER — Ambulatory Visit: Payer: No Typology Code available for payment source | Admitting: Orthopedic Surgery

## 2019-02-03 ENCOUNTER — Other Ambulatory Visit: Payer: Self-pay | Admitting: Urology

## 2019-02-03 ENCOUNTER — Other Ambulatory Visit (HOSPITAL_COMMUNITY): Payer: Self-pay | Admitting: Urology

## 2019-02-03 DIAGNOSIS — R972 Elevated prostate specific antigen [PSA]: Secondary | ICD-10-CM

## 2019-02-19 ENCOUNTER — Other Ambulatory Visit (HOSPITAL_COMMUNITY): Payer: No Typology Code available for payment source

## 2019-05-10 ENCOUNTER — Encounter: Payer: Self-pay | Admitting: Orthopedic Surgery

## 2019-05-10 ENCOUNTER — Ambulatory Visit (INDEPENDENT_AMBULATORY_CARE_PROVIDER_SITE_OTHER): Payer: No Typology Code available for payment source | Admitting: Orthopedic Surgery

## 2019-05-10 ENCOUNTER — Other Ambulatory Visit: Payer: Self-pay

## 2019-05-10 VITALS — BP 150/85 | HR 86 | Temp 98.1°F | Ht 71.0 in | Wt 224.0 lb

## 2019-05-10 DIAGNOSIS — G8929 Other chronic pain: Secondary | ICD-10-CM | POA: Diagnosis not present

## 2019-05-10 DIAGNOSIS — M25562 Pain in left knee: Secondary | ICD-10-CM

## 2019-05-10 NOTE — Progress Notes (Signed)
63 year old male requests injection left knee  Chief Complaint  Patient presents with  . Knee Pain    Bilat knee pain wants shot in the left knee    Procedure note left knee injection   verbal consent was obtained to inject left knee joint  Timeout was completed to confirm the site of injection  The medications used were 40 mg of Depo-Medrol and 1% lidocaine 3 cc  Anesthesia was provided by ethyl chloride and the skin was prepped with alcohol.  After cleaning the skin with alcohol a 20-gauge needle was used to inject the left knee joint. There were no complications. A sterile bandage was applied.   Encounter Diagnosis  Name Primary?  . Chronic pain of left knee Yes    He would like to have a knee replacement on or in October.  Come back in late September for preop

## 2019-07-02 ENCOUNTER — Other Ambulatory Visit: Payer: Self-pay

## 2019-07-02 ENCOUNTER — Ambulatory Visit (INDEPENDENT_AMBULATORY_CARE_PROVIDER_SITE_OTHER): Payer: No Typology Code available for payment source | Admitting: Urology

## 2019-07-02 DIAGNOSIS — R972 Elevated prostate specific antigen [PSA]: Secondary | ICD-10-CM

## 2019-07-02 DIAGNOSIS — E230 Hypopituitarism: Secondary | ICD-10-CM

## 2019-07-02 DIAGNOSIS — N401 Enlarged prostate with lower urinary tract symptoms: Secondary | ICD-10-CM | POA: Diagnosis not present

## 2019-07-02 DIAGNOSIS — R3912 Poor urinary stream: Secondary | ICD-10-CM | POA: Diagnosis not present

## 2019-08-12 ENCOUNTER — Telehealth: Payer: Self-pay | Admitting: Radiology

## 2019-08-12 NOTE — Telephone Encounter (Signed)
Started a prior auth for his knee replacement pending auth number is [id number] fax [phone] / faxed clinicals

## 2019-08-16 ENCOUNTER — Ambulatory Visit: Payer: No Typology Code available for payment source | Admitting: Orthopedic Surgery

## 2019-08-16 ENCOUNTER — Encounter: Payer: Self-pay | Admitting: Orthopedic Surgery

## 2019-08-16 ENCOUNTER — Other Ambulatory Visit: Payer: Self-pay

## 2019-08-16 VITALS — BP 123/66 | HR 77 | Temp 97.9°F | Ht 71.0 in | Wt 220.0 lb

## 2019-08-16 DIAGNOSIS — G8929 Other chronic pain: Secondary | ICD-10-CM | POA: Diagnosis not present

## 2019-08-16 DIAGNOSIS — M1712 Unilateral primary osteoarthritis, left knee: Secondary | ICD-10-CM

## 2019-08-16 DIAGNOSIS — M25562 Pain in left knee: Secondary | ICD-10-CM | POA: Diagnosis not present

## 2019-08-16 NOTE — Progress Notes (Signed)
Melvin Turner  08/16/2019  HISTORY SECTION :  Chief Complaint  Patient presents with  . Knee Pain    Recheck on left knee.   HPI The patient presents for evaluation of preop for left total knee replacement  He still has radicular pain in his left leg but wishes to proceed with left total knee replacement understanding that his pain that is going from the bottom of his foot to his lower back would not be relieved by knee surgery.  He acknowledges that he is having difficulty straightening his knee secondary to his hamstring spasms which is more likely related to degenerative disc disease lumbar spine  He is on heavy medication from pain management center which will be continued and required postoperatively as for the left knee  Location medial joint line Duration greater than 2 years Quality dull ache Severity 10 Associated with weightbearing  ROS   has a past medical history of Arthritis, BPH (benign prostatic hyperplasia), GERD (gastroesophageal reflux disease), and Hypertension.   Past Surgical History:  Procedure Laterality Date  . COLONOSCOPY    . KNEE ARTHROSCOPY Right 01/31/2015   Procedure: RIGHT KNEE ARTHROSCOPY, PARTIAL MEDIAL MENISECTOMY;  Surgeon: Sanjuana Kava, MD;  Location: [facility];  Service: Orthopedics;  Laterality: Right;  . TONSILLECTOMY    . TOTAL KNEE ARTHROPLASTY Right 02/03/2018   Procedure: TOTAL KNEE ARTHROPLASTY;  Surgeon: Carole Civil, MD;  Location: [facility];  Service: Orthopedics;  Laterality: Right;  Marland Kitchen VASECTOMY      Body mass index is 30.68 kg/m.   Allergies  Allergen Reactions  . Poison Ivy Extract [Poison Ivy Extract] Hives and Rash     Current Outpatient Medications:  .  amLODipine (NORVASC) 5 MG tablet, , Disp: , Rfl:  .  Ascorbic Acid (VITAMIN C CR) 1000 MG TBCR, Take 1 tablet by mouth daily., Disp: , Rfl:  .  baclofen (LIORESAL) 10 MG tablet, Take 10 mg by mouth 3 (three) times daily as needed., Disp: , Rfl:  .  benazepril  (LOTENSIN) 20 MG tablet, Take 20 mg by mouth daily., Disp: , Rfl:  .  HYDROcodone-acetaminophen (NORCO) 10-325 MG tablet, TAKE 1 TABLET BY MOUTH 4 TIMES DAILY AS NEEDED, Disp: , Rfl:  .  HYSINGLA ER 20 MG T24A, , Disp: , Rfl:  .  Multiple Vitamin (MULTIVITAMIN WITH MINERALS) TABS tablet, Take 1 tablet by mouth daily., Disp: , Rfl:  .  omeprazole (PRILOSEC) 20 MG capsule, , Disp: , Rfl:  .  tamsulosin (FLOMAX) 0.4 MG CAPS capsule, Take 0.4 mg by mouth., Disp: , Rfl:  .  L-ARGININE-500 PO, Take 1 tablet by mouth daily. , Disp: , Rfl:    PHYSICAL EXAM SECTION: 1) BP 123/66   Pulse 77   Temp 97.9 F (36.6 C)   Ht 5\' 11"  (1.803 m)   Wt 220 lb (99.8 kg)   BMI 30.68 kg/m   Body mass index is 30.68 kg/m. General appearance: Well-developed well-nourished no gross deformities  2) Cardiovascular normal pulse and perfusion in the lower extremities normal color without edema  3) Neurologically deep tendon reflexes are equal and normal, no sensation loss or deficits no pathologic reflexes  4) Psychological: Awake alert and oriented x3 mood and affect normal  5) Skin no lacerations or ulcerations no nodularity no palpable masses, no erythema or nodularity  6) Musculoskeletal:  Left knee has a 10 to 15 degree flexion contracture which I think is more likely related to hamstring spasm he flexes knee actively  to 100 degrees Medial joint line tenderness severe, moderate lateral Extensor mechanism is intact skin is normal Knee is stable   MEDICAL DECISION SECTION:  Encounter Diagnoses  Name Primary?  . Chronic pain of left knee Yes  . Primary osteoarthritis of left knee     Imaging Prior imaging shows osteoarthritis medial compartment bone-on-bone varus mild deformity  Plan:  (Rx., Inj., surg., Frx, MRI/CT, XR:2)  The procedure has been fully reviewed with the patient; The risks and benefits of surgery have been discussed and explained and understood. Alternative treatment has also been  reviewed, questions were encouraged and answered. The postoperative plan is also been reviewed.  Left total knee October 13  8:53 AM Fuller Canada, MD  08/16/2019

## 2019-08-16 NOTE — Patient Instructions (Signed)
You have decided to proceed with knee replacement surgery. You have decided not to continue with nonoperative measures such as but not limited to oral medication, weight loss, activity modification, physical therapy, bracing, or injection.  We will perform the procedure commonly known as total knee replacement. Some of the risks associated with knee replacement surgery include but are not limited to Bleeding Infection Swelling Stiffness Blood clot Pulmonary embolism  Loosening of the implant Pain that persists even after surgery  Infection is especially devastating complication of knee surgery although rare. If infection does occur your implant will usually have to be removed and several surgeries and antibiotics will be needed to eradicate the infection prior to performing a repeat replacement.   In some cases amputation is required to eradicate the infection. In other rare cases a knee fusion is needed   In compliance with recent [place] law in federal regulation regarding opioid use and abuse and addiction, we will taper (stop) opioid medication after 2 weeks.  If you're not comfortable with these risks and would like to continue with nonoperative treatment please let Dr. Ellakate Gonsalves know prior to your surgery.  

## 2019-08-17 ENCOUNTER — Other Ambulatory Visit: Payer: Self-pay | Admitting: Orthopedic Surgery

## 2019-08-17 DIAGNOSIS — M1712 Unilateral primary osteoarthritis, left knee: Secondary | ICD-10-CM

## 2019-08-17 NOTE — Telephone Encounter (Signed)
[id number] Josem Kaufmann number, he is approved for inpatient 2 days 08/31/2019 throught 09/02/2019  FYI only

## 2019-08-17 NOTE — Addendum Note (Signed)
Addended byCandice Camp on: 08/17/2019 10:37 AM   Modules accepted: Orders

## 2019-08-26 NOTE — Patient Instructions (Signed)
Melvin Turner  08/26/2019     @   Your procedure is scheduled on  08/31/2019 .  Report to [facility] at  0815   A.M.  Call this number if you have problems the morning of surgery:  [phone]   Remember:  Do not eat or drink after midnight.                         Take these medicines the morning of surgery with A SIP OF WATER amlodipine, baclofen, hydrocodone or Hysingla, prilosec, flomax.    Do not wear jewelry, make-up or nail polish.  Do not wear lotions, powders, or perfumes. Please wear deodorant and brush your teeth.  Do not shave 48 hours prior to surgery.  Men may shave face and neck.  Do not bring valuables to the hospital.  [facility] is not responsible for any belongings or valuables.  Contacts, dentures or bridgework may not be worn into surgery.  Leave your suitcase in the car.  After surgery it may be brought to your room.  For patients admitted to the hospital, discharge time will be determined by your treatment team.  Patients discharged the day of surgery will not be allowed to drive home.   Name and phone number of your driver:   family Special instructions:  None  Please read over the following fact sheets that you were given. Pain Booklet, Coughing and Deep Breathing, Blood Transfusion Information, Lab Information, Total Joint Packet, MRSA Information, Surgical Site Infection Prevention, Anesthesia Post-op Instructions and Care and Recovery After Surgery       Total Knee Replacement, Care After This sheet gives you information about how to care for yourself after your procedure. Your health care provider may also give you more specific instructions. If you have problems or questions, contact your health care provider. What can I expect after the procedure? After the procedure, it is common to have:  Pain.  Swelling.  A small amount of blood or clear fluid coming from your incision.  Limited range of  motion. Follow these instructions at home: Medicines  Take over-the-counter and prescription medicines only as told by your health care provider.  If you were prescribed a blood thinner (anticoagulant), take it as told by your health care provider.  Ask your health care provider if the medicine prescribed to you: ? Requires you to avoid driving or using heavy machinery. ? Can cause constipation. You may need to take actions to prevent or treat constipation, such as:  Drink enough fluid to keep your urine pale yellow.  Take over-the-counter or prescription medicines.  Eat foods that are high in fiber, such as beans, whole grains, and fresh fruits and vegetables.  Limit foods that are high in fat and processed sugars, such as fried or sweet foods. Bathing  Do not take baths, swim, or use a hot tub until your health care provider approves. Ask your health care provider if you may take showers. You may only be allowed to take sponge baths.  Keep your bandage (dressing) dry until your health care provider says it can be removed. Incision care and drain care   Follow instructions from your health care provider about how to take care of your incision. Make sure you: ? Wash your hands with soap and water before and after you change your dressing. If soap and water are not available, use hand  sanitizer. ? Change your dressing as told by your health care provider. ? Leave stitches (sutures), skin glue, or adhesive strips in place. These skin closures may need to stay in place for 2 weeks or longer. If adhesive strip edges start to loosen and curl up, you may trim the loose edges. Do not remove adhesive strips completely unless your health care provider tells you to do that.  Check your incision area and drain site every day for signs of infection. Check for: ? More redness, swelling, or pain. ? More fluid or blood. ? Warmth. ? Pus or a bad smell.  If you have a drain, follow instructions  from your health care provider about caring for it. Managing pain, stiffness, and swelling      If directed, put ice on your knee. ? Put ice in a plastic bag or use the icing device (cold flow pad or cryocuff) that you were given. Follow instructions from your health care provider about how to use the icing device. ? Place a towel between your skin and the bag or between your skin and the icing device. ? Leave the ice on for 20 minutes, 2-3 times per day.  If directed, apply heat to the affected area before you exercise. Use the heat source that your health care provider recommends, such as a moist heat pack or a heating pad. ? Place a towel between your skin and the heat source. ? Leave the heat on for 20-30 minutes. ? Remove the heat if your skin turns bright red. This is especially important if you are unable to feel pain, heat, or cold. You may have a greater risk of getting burned.  Move your toes often to avoid stiffness and to lessen swelling.  Raise (elevate) your leg above the level of your heart while you are sitting or lying down. ? Use several pillows to keep your leg straight. ? Do not put a pillow just under the knee. If the knee is bent for a long time, this may lead to stiffness.  Wear elastic knee support as told by your health care provider. Activity  Rest as told by your health care provider.  Avoid sitting for a long time without moving. Get up to take short walks every 1-2 hours. This is important to improve blood flow and breathing. Ask for help if you feel weak or unsteady.  Ask your health care provider what activities are safe for you.  Avoid high-impact activities, including running, jumping rope, and jumping jacks.  Do not play contact sports until your health care provider approves.  Do exercises as told by your physical therapist.  If you have been sent home with a continuous passive motion machine, use it as told by your health care  provider. Safety   Do not use your leg to support your body weight until your health care provider approves. Use crutches or a walker as told by your health care provider.  Do not drive until your health care provider approves. Ask your health care provider when it is safe to drive. General instructions  Do not use any products that contain nicotine or tobacco, such as cigarettes, e-cigarettes, and chewing tobacco. These can delay healing after surgery. If you need help quitting, ask your health care provider.  Wear compression stockings as told by your health care provider.  Tell your health care provider if you plan to have dental work. Also, tell your dentist about your joint replacement.  Keep all follow-up  visits as told by your health care provider. This is important. Contact a health care provider if you have:  More redness, swelling, or pain around your incision or drain.  More fluid or blood coming from your incision or drain.  Pus or a bad smell coming from your incision or drain.  Warmth on your incision or drain site.  A fever.  An incision that breaks open.  Knee pain that does not go away.  Range of motion in your knee that is getting worse.  A prosthesis that feels loose. Get help right away if you have:  Pain or swelling in your calf or thigh.  Shortness of breath or difficulty breathing.  Chest pain. Summary  After the procedure, it is common to have pain and swelling, blood or fluid coming from your incision, and limited range of motion.  Follow instructions from your health care provider about how to take care of your incision.  Use crutches or a walker as told by your health care provider.  If you were prescribed a blood thinner (anticoagulant), take it as told by your health care provider.  Keep all follow-up visits as told by your health care provider. This is important. This information is not intended to replace advice given to you by your  health care provider. Make sure you discuss any questions you have with your health care provider. Document Released: 05/24/2005 Document Revised: 11/27/2018 Document Reviewed: 06/18/2018 Elsevier Interactive Patient Education  2020 [employer]  Spinal Anesthesia and Epidural Anesthesia, Care After This sheet gives you information about how to care for yourself after your procedure. Your doctor may also give you more specific instructions. If you have problems or questions, call your doctor. Follow these instructions at home: For at least 24 hours after the procedure:   Have a responsible adult stay with you. It is important to have someone help care for you until you are awake and alert.  Rest as needed.  Do not do activities where you could fall or get hurt (injured).  Do not drive.  Do not use heavy machinery.  Do not drink alcohol.  Do not take sleeping pills or medicines that make you sleepy (drowsy).  Do not make important decisions.  Do not sign legal documents.  Do not take care of children on your own. Eating and drinking  If you throw up (vomit), drink water, juice, or soup when nausea and vomiting stop.  Drink enough fluid to keep your pee (urine) pale yellow.  Make sure you do not feel like throwing up (nauseous) before you eat solid foods.  Follow the diet that your doctor recommends. General instructions  Return to your normal activities as told by your doctor. Ask your doctor what activities are safe for you.  Take over-the-counter and prescription medicines only as told by your doctor.  If you have sleep apnea, surgery and certain medicines can raise your risk for breathing problems. Follow instructions from your doctor about when to wear your sleep device. Your doctor may tell you to wear your sleep device: ? Anytime you are sleeping, including during daytime naps. ? While taking prescription pain medicines, sleeping pills, or medicines that make you  sleepy.  Do not use any products that contain nicotine or tobacco. This includes cigarettes and e-cigarettes. ? If you need help quitting, ask your doctor. ? If you smoke, do not smoke by yourself. Make sure someone is nearby in case you need help.  Keep all follow-up visits  as told by your doctor. This is important. Contact a doctor if:  It has been more than one day since your procedure and you feel like throwing up.  It has been more than one day since your procedure and you throw up.  You have a rash. Get help right away if:  You have a fever.  You have a headache that lasts a long time.  You have a very bad headache.  Your vision is blurry.  You see two of a single object (double vision).  You are dizzy or light-headed.  You faint.  Your arms or legs tingle, feel weak, or get numb.  You have trouble breathing.  You cannot pee (urinate). Summary  After the procedure, have a responsible adult stay with you at home until you are fully awake and alert.  Do not do activities that might get you injured. Do not drive, use heavy machinery, drink alcohol, or make important decisions for 24 hours after the procedure.  Take medicines as told by your doctor. Do not use products that contain nicotine or tobacco.  Get help right away if you have a fever, blurry vision, difficulty breathing or passing urine, or weakness or numbness in arms or legs. This information is not intended to replace advice given to you by your health care provider. Make sure you discuss any questions you have with your health care provider. Document Released: 02/26/2016 Document Revised: 10/17/2017 Document Reviewed: 02/26/2016 Elsevier Patient Education  2020 [place] Anesthesia, Adult, Care After This sheet gives you information about how to care for yourself after your procedure. Your health care provider may also give you more specific instructions. If you have problems or questions,  contact your health care provider. What can I expect after the procedure? After the procedure, the following side effects are common:  Pain or discomfort at the IV site.  Nausea.  Vomiting.  Sore throat.  Trouble concentrating.  Feeling cold or chills.  Weak or tired.  Sleepiness and fatigue.  Soreness and body aches. These side effects can affect parts of the body that were not involved in surgery. Follow these instructions at home:  For at least 24 hours after the procedure:  Have a responsible adult stay with you. It is important to have someone help care for you until you are awake and alert.  Rest as needed.  Do not: ? Participate in activities in which you could fall or become injured. ? Drive. ? Use heavy machinery. ? Drink alcohol. ? Take sleeping pills or medicines that cause drowsiness. ? Make important decisions or sign legal documents. ? Take care of children on your own. Eating and drinking  Follow any instructions from your health care provider about eating or drinking restrictions.  When you feel hungry, start by eating small amounts of foods that are soft and easy to digest (bland), such as toast. Gradually return to your regular diet.  Drink enough fluid to keep your urine pale yellow.  If you vomit, rehydrate by drinking water, juice, or clear broth. General instructions  If you have sleep apnea, surgery and certain medicines can increase your risk for breathing problems. Follow instructions from your health care provider about wearing your sleep device: ? Anytime you are sleeping, including during daytime naps. ? While taking prescription pain medicines, sleeping medicines, or medicines that make you drowsy.  Return to your normal activities as told by your health care provider. Ask your health care provider what activities  are safe for you.  Take over-the-counter and prescription medicines only as told by your health care provider.  If you  smoke, do not smoke without supervision.  Keep all follow-up visits as told by your health care provider. This is important. Contact a health care provider if:  You have nausea or vomiting that does not get better with medicine.  You cannot eat or drink without vomiting.  You have pain that does not get better with medicine.  You are unable to pass urine.  You develop a skin rash.  You have a fever.  You have redness around your IV site that gets worse. Get help right away if:  You have difficulty breathing.  You have chest pain.  You have blood in your urine or stool, or you vomit blood. Summary  After the procedure, it is common to have a sore throat or nausea. It is also common to feel tired.  Have a responsible adult stay with you for the first 24 hours after general anesthesia. It is important to have someone help care for you until you are awake and alert.  When you feel hungry, start by eating small amounts of foods that are soft and easy to digest (bland), such as toast. Gradually return to your regular diet.  Drink enough fluid to keep your urine pale yellow.  Return to your normal activities as told by your health care provider. Ask your health care provider what activities are safe for you. This information is not intended to replace advice given to you by your health care provider. Make sure you discuss any questions you have with your health care provider. Document Released: 02/10/2001 Document Revised: 11/07/2017 Document Reviewed: 06/20/2017 Elsevier Patient Education  2020 [employer]. How to Use Chlorhexidine for Bathing Chlorhexidine gluconate (CHG) is a germ-killing (antiseptic) solution that is used to clean the skin. It can get rid of the bacteria that normally live on the skin and can keep them away for about 24 hours. To clean your skin with CHG, you may be given:  A CHG solution to use in the shower or as part of a sponge bath.  A prepackaged  cloth that contains CHG. Cleaning your skin with CHG may help lower the risk for infection:  While you are staying in the intensive care unit of the hospital.  If you have a vascular access, such as a central line, to provide short-term or long-term access to your veins.  If you have a catheter to drain urine from your bladder.  If you are on a ventilator. A ventilator is a machine that helps you breathe by moving air in and out of your lungs.  After surgery. What are the risks? Risks of using CHG include:  A skin reaction.  Hearing loss, if CHG gets in your ears.  Eye injury, if CHG gets in your eyes and is not rinsed out.  The CHG product catching fire. Make sure that you avoid smoking and flames after applying CHG to your skin. Do not use CHG:  If you have a chlorhexidine allergy or have previously reacted to chlorhexidine.  On babies younger than 51 months of age. How to use CHG solution  Use CHG only as told by your health care provider, and follow the instructions on the label.  Use the full amount of CHG as directed. Usually, this is one bottle. During a shower Follow these steps when using CHG solution during a shower (unless your health care provider  gives you different instructions): 1. Start the shower. 2. Use your normal soap and shampoo to wash your face and hair. 3. Turn off the shower or move out of the shower stream. 4. Pour the CHG onto a clean washcloth. Do not use any type of brush or rough-edged sponge. 5. Starting at your neck, lather your body down to your toes. Make sure you follow these instructions: ? If you will be having surgery, pay special attention to the part of your body where you will be having surgery. Scrub this area for at least 1 minute. ? Do not use CHG on your head or face. If the solution gets into your ears or eyes, rinse them well with water. ? Avoid your genital area. ? Avoid any areas of skin that have broken skin, cuts, or  scrapes. ? Scrub your back and under your arms. Make sure to wash skin folds. 6. Let the lather sit on your skin for 1-2 minutes or as long as told by your health care provider. 7. Thoroughly rinse your entire body in the shower. Make sure that all body creases and crevices are rinsed well. 8. Dry off with a clean towel. Do not put any substances on your body afterward--such as powder, lotion, or perfume--unless you are told to do so by your health care provider. Only use lotions that are recommended by the manufacturer. 9. Put on clean clothes or pajamas. 10. If it is the night before your surgery, sleep in clean sheets.  During a sponge bath Follow these steps when using CHG solution during a sponge bath (unless your health care provider gives you different instructions): 1. Use your normal soap and shampoo to wash your face and hair. 2. Pour the CHG onto a clean washcloth. 3. Starting at your neck, lather your body down to your toes. Make sure you follow these instructions: ? If you will be having surgery, pay special attention to the part of your body where you will be having surgery. Scrub this area for at least 1 minute. ? Do not use CHG on your head or face. If the solution gets into your ears or eyes, rinse them well with water. ? Avoid your genital area. ? Avoid any areas of skin that have broken skin, cuts, or scrapes. ? Scrub your back and under your arms. Make sure to wash skin folds. 4. Let the lather sit on your skin for 1-2 minutes or as long as told by your health care provider. 5. Using a different clean, wet washcloth, thoroughly rinse your entire body. Make sure that all body creases and crevices are rinsed well. 6. Dry off with a clean towel. Do not put any substances on your body afterward--such as powder, lotion, or perfume--unless you are told to do so by your health care provider. Only use lotions that are recommended by the manufacturer. 7. Put on clean clothes or  pajamas. 8. If it is the night before your surgery, sleep in clean sheets. How to use CHG prepackaged cloths  Only use CHG cloths as told by your health care provider, and follow the instructions on the label.  Use the CHG cloth on clean, dry skin.  Do not use the CHG cloth on your head or face unless your health care provider tells you to.  When washing with the CHG cloth: ? Avoid your genital area. ? Avoid any areas of skin that have broken skin, cuts, or scrapes. Before surgery Follow these steps when using  a CHG cloth to clean before surgery (unless your health care provider gives you different instructions): 1. Using the CHG cloth, vigorously scrub the part of your body where you will be having surgery. Scrub using a back-and-forth motion for 3 minutes. The area on your body should be completely wet with CHG when you are done scrubbing. 2. Do not rinse. Discard the cloth and let the area air-dry. Do not put any substances on the area afterward, such as powder, lotion, or perfume. 3. Put on clean clothes or pajamas. 4. If it is the night before your surgery, sleep in clean sheets.  For general bathing Follow these steps when using CHG cloths for general bathing (unless your health care provider gives you different instructions). 1. Use a separate CHG cloth for each area of your body. Make sure you wash between any folds of skin and between your fingers and toes. Wash your body in the following order, switching to a new cloth after each step: ? The front of your neck, shoulders, and chest. ? Both of your arms, under your arms, and your hands. ? Your stomach and groin area, avoiding the genitals. ? Your right leg and foot. ? Your left leg and foot. ? The back of your neck, your back, and your buttocks. 2. Do not rinse. Discard the cloth and let the area air-dry. Do not put any substances on your body afterward--such as powder, lotion, or perfume--unless you are told to do so by your  health care provider. Only use lotions that are recommended by the manufacturer. 3. Put on clean clothes or pajamas. Contact a health care provider if:  Your skin gets irritated after scrubbing.  You have questions about using your solution or cloth. Get help right away if:  Your eyes become very red or swollen.  Your eyes itch badly.  Your skin itches badly and is red or swollen.  Your hearing changes.  You have trouble seeing.  You have swelling or tingling in your mouth or throat.  You have trouble breathing.  You swallow any chlorhexidine. Summary  Chlorhexidine gluconate (CHG) is a germ-killing (antiseptic) solution that is used to clean the skin. Cleaning your skin with CHG may help to lower your risk for infection.  You may be given CHG to use for bathing. It may be in a bottle or in a prepackaged cloth to use on your skin. Carefully follow your health care provider's instructions and the instructions on the product label.  Do not use CHG if you have a chlorhexidine allergy.  Contact your health care provider if your skin gets irritated after scrubbing. This information is not intended to replace advice given to you by your health care provider. Make sure you discuss any questions you have with your health care provider. Document Released: 07/29/2012 Document Revised: 01/21/2019 Document Reviewed: 10/02/2017 Elsevier Patient Education  2020 [employer].

## 2019-08-27 ENCOUNTER — Encounter (HOSPITAL_COMMUNITY)
Admission: RE | Admit: 2019-08-27 | Discharge: 2019-08-27 | Disposition: A | Discharge status: Home / Self Care | Payer: PRIVATE HEALTH INSURANCE | Source: Ambulatory Visit | Attending: Orthopedic Surgery | Admitting: Orthopedic Surgery

## 2019-08-27 ENCOUNTER — Other Ambulatory Visit (HOSPITAL_COMMUNITY)
Admission: RE | Admit: 2019-08-27 | Discharge: 2019-08-27 | Disposition: A | Discharge status: Home / Self Care | Payer: PRIVATE HEALTH INSURANCE | Source: Ambulatory Visit | Attending: Orthopedic Surgery | Admitting: Orthopedic Surgery

## 2019-08-27 ENCOUNTER — Encounter (HOSPITAL_COMMUNITY): Payer: Self-pay

## 2019-08-27 ENCOUNTER — Other Ambulatory Visit: Payer: Self-pay

## 2019-08-27 DIAGNOSIS — Z01818 Encounter for other preprocedural examination: Secondary | ICD-10-CM | POA: Insufficient documentation

## 2019-08-27 DIAGNOSIS — U071 COVID-19: Secondary | ICD-10-CM | POA: Diagnosis not present

## 2019-08-27 LAB — CBC WITH DIFFERENTIAL/PLATELET
Abs Immature Granulocytes: 0.02 10*3/uL (ref 0.00–0.07)
Basophils Absolute: 0 10*3/uL (ref 0.0–0.1)
Basophils Relative: 0 %
Eosinophils Absolute: 0.1 10*3/uL (ref 0.0–0.5)
Eosinophils Relative: 1 %
HCT: 43.1 % (ref 39.0–52.0)
Hemoglobin: 13.7 g/dL (ref 13.0–17.0)
Immature Granulocytes: 0 %
Lymphocytes Relative: 67 %
Lymphs Abs: 5.7 10*3/uL — ABNORMAL HIGH (ref 0.7–4.0)
MCH: 29.9 pg (ref 26.0–34.0)
MCHC: 31.8 g/dL (ref 30.0–36.0)
MCV: 94.1 fL (ref 80.0–100.0)
Monocytes Absolute: 0.4 10*3/uL (ref 0.1–1.0)
Monocytes Relative: 5 %
Neutro Abs: 2.3 10*3/uL (ref 1.7–7.7)
Neutrophils Relative %: 27 %
Platelets: 187 10*3/uL (ref 150–400)
RBC: 4.58 MIL/uL (ref 4.22–5.81)
RDW: 12.8 % (ref 11.5–15.5)
WBC: 8.6 10*3/uL (ref 4.0–10.5)
nRBC: 0 % (ref 0.0–0.2)

## 2019-08-27 LAB — BASIC METABOLIC PANEL
Anion gap: 9 (ref 5–15)
BUN: 13 mg/dL (ref 8–23)
CO2: 22 mmol/L (ref 22–32)
Calcium: 8.7 mg/dL — ABNORMAL LOW (ref 8.9–10.3)
Chloride: 110 mmol/L (ref 98–111)
Creatinine, Ser: 0.69 mg/dL (ref 0.61–1.24)
GFR calc Af Amer: >60 mL/min (ref >60)
GFR calc non Af Amer: >60 mL/min (ref >60)
Glucose, Bld: 154 mg/dL — ABNORMAL HIGH (ref 70–99)
Potassium: 3.6 mmol/L (ref 3.5–5.1)
Sodium: 141 mmol/L (ref 135–145)

## 2019-08-27 LAB — SURGICAL PCR SCREEN
MRSA, PCR: NEGATIVE
Staphylococcus aureus: NEGATIVE

## 2019-08-27 LAB — PREPARE RBC (CROSSMATCH)

## 2019-08-27 LAB — SARS CORONAVIRUS 2 (TAT 6-24 HRS): SARS Coronavirus 2: POSITIVE — AB

## 2019-08-28 NOTE — Progress Notes (Signed)
CRITICAL VALUE ALERT  Critical Value:  Covid Positive  Date & Time Notied:  08/27/2019 2013  Provider Notified: Dr. Aline Brochure  Orders Received/Actions taken: None

## 2019-08-30 ENCOUNTER — Telehealth: Payer: Self-pay | Admitting: Orthopedic Surgery

## 2019-08-30 ENCOUNTER — Encounter: Payer: Self-pay | Admitting: Orthopedic Surgery

## 2019-08-30 ENCOUNTER — Telehealth: Payer: Self-pay | Admitting: Radiology

## 2019-08-30 NOTE — Telephone Encounter (Signed)
Call via voice message received at 8:07am from [facility] - relays that patient has tested positive for Covid-19; therefore, will have to have surgery re-scheduled from date 08/31/19. States if any questions, may call her direct [phone]

## 2019-08-30 NOTE — Telephone Encounter (Signed)
Note provided as per request and faxed accordingly to employer, [employer], attention [employer], [phone]. Signed authorization form on file.

## 2019-08-30 NOTE — Telephone Encounter (Signed)
I called patient, he has had a positive covid test. He has been advised to quarantine for 2 weeks and to let his close contacts know he is positive. If he develops symptoms, he needs to call his primary care physician. I have told him we will RS surgery to Nov 3rd. He has voiced understanding.

## 2019-08-30 NOTE — Telephone Encounter (Signed)
Patient aware surgery is being re-scheduled to 09/21/19. Asking for note indicating the Covid-19 reason for cancellation of original surgery date, 09/02/19. States employer requires note to indicate the reason due to his request for time out of work.

## 2019-09-03 LAB — TYPE AND SCREEN
ABO/RH(D): A POS
Antibody Screen: NEGATIVE
Unit division: 0
Unit division: 0

## 2019-09-03 LAB — BPAM RBC
Blood Product Expiration Date: 202010292359
Blood Product Expiration Date: 202011072359
Unit Type and Rh: 6200
Unit Type and Rh: 6200

## 2019-09-06 ENCOUNTER — Telehealth: Payer: Self-pay | Admitting: Radiology

## 2019-09-06 NOTE — Telephone Encounter (Signed)
Patient feels fine and all his known contacts including wife have tested negative for covid.  I told him I am glad he is well, he is RS to nov 3rd for TKR, he is asking if his Covid test will need to be repeated, I told him I do not think so but I will double check. Please advise

## 2019-09-06 NOTE — Telephone Encounter (Signed)
Left message for him to advise  

## 2019-09-06 NOTE — Telephone Encounter (Signed)
Currently no   If needed OR will request when hes rescheduled

## 2019-09-15 ENCOUNTER — Ambulatory Visit: Payer: No Typology Code available for payment source | Admitting: Orthopedic Surgery

## 2019-09-15 NOTE — Patient Instructions (Signed)
Melvin Turner  09/15/2019     @   Your procedure is scheduled on  09/21/2019 .  Report to [facility] at  [phone]  A.M.  Call this number if you have problems the morning of surgery:  [phone]   Remember:  Do not eat or drink after midnight.                        Take these medicines the morning of surgery with A SIP OF WATER  Amlodipine, hydrocodone or hysingla, prilosec, flomax.    Do not wear jewelry, make-up or nail polish.  Do not wear lotions, powders, or perfumes. Please wear deodorant and brush your teeth. Do not shave 48 hours prior to surgery.  Men may shave face and neck.  Do not bring valuables to the hospital.  [facility] is not responsible for any belongings or valuables.  Contacts, dentures or bridgework may not be worn into surgery.  Leave your suitcase in the car.  After surgery it may be brought to your room.  For patients admitted to the hospital, discharge time will be determined by your treatment team.  Patients discharged the day of surgery will not be allowed to drive home.   Name and phone number of your driver:   family Special instructions:  None  Please read over the following fact sheets that you were given. Pain Booklet, Coughing and Deep Breathing, Blood Transfusion Information, Total Joint Packet, MRSA Information, Surgical Site Infection Prevention, Anesthesia Post-op Instructions and Care and Recovery After Surgery       Total Knee Replacement, Care After This sheet gives you information about how to care for yourself after your procedure. Your health care provider may also give you more specific instructions. If you have problems or questions, contact your health care provider. What can I expect after the procedure? After the procedure, it is common to have:  Pain.  Swelling.  A small amount of blood or clear fluid coming from your incision.  Limited range of motion. Follow these instructions  at home: Medicines  Take over-the-counter and prescription medicines only as told by your health care provider.  If you were prescribed a blood thinner (anticoagulant), take it as told by your health care provider.  Ask your health care provider if the medicine prescribed to you: ? Requires you to avoid driving or using heavy machinery. ? Can cause constipation. You may need to take actions to prevent or treat constipation, such as:  Drink enough fluid to keep your urine pale yellow.  Take over-the-counter or prescription medicines.  Eat foods that are high in fiber, such as beans, whole grains, and fresh fruits and vegetables.  Limit foods that are high in fat and processed sugars, such as fried or sweet foods. Bathing  Do not take baths, swim, or use a hot tub until your health care provider approves. Ask your health care provider if you may take showers. You may only be allowed to take sponge baths.  Keep your bandage (dressing) dry until your health care provider says it can be removed. Incision care and drain care   Follow instructions from your health care provider about how to take care of your incision. Make sure you: ? Wash your hands with soap and water before and after you change your dressing. If soap and water are not available, use hand sanitizer. ? Change your  dressing as told by your health care provider. ? Leave stitches (sutures), skin glue, or adhesive strips in place. These skin closures may need to stay in place for 2 weeks or longer. If adhesive strip edges start to loosen and curl up, you may trim the loose edges. Do not remove adhesive strips completely unless your health care provider tells you to do that.  Check your incision area and drain site every day for signs of infection. Check for: ? More redness, swelling, or pain. ? More fluid or blood. ? Warmth. ? Pus or a bad smell.  If you have a drain, follow instructions from your health care provider  about caring for it. Managing pain, stiffness, and swelling      If directed, put ice on your knee. ? Put ice in a plastic bag or use the icing device (cold flow pad or cryocuff) that you were given. Follow instructions from your health care provider about how to use the icing device. ? Place a towel between your skin and the bag or between your skin and the icing device. ? Leave the ice on for 20 minutes, 2-3 times per day.  If directed, apply heat to the affected area before you exercise. Use the heat source that your health care provider recommends, such as a moist heat pack or a heating pad. ? Place a towel between your skin and the heat source. ? Leave the heat on for 20-30 minutes. ? Remove the heat if your skin turns bright red. This is especially important if you are unable to feel pain, heat, or cold. You may have a greater risk of getting burned.  Move your toes often to avoid stiffness and to lessen swelling.  Raise (elevate) your leg above the level of your heart while you are sitting or lying down. ? Use several pillows to keep your leg straight. ? Do not put a pillow just under the knee. If the knee is bent for a long time, this may lead to stiffness.  Wear elastic knee support as told by your health care provider. Activity  Rest as told by your health care provider.  Avoid sitting for a long time without moving. Get up to take short walks every 1-2 hours. This is important to improve blood flow and breathing. Ask for help if you feel weak or unsteady.  Ask your health care provider what activities are safe for you.  Avoid high-impact activities, including running, jumping rope, and jumping jacks.  Do not play contact sports until your health care provider approves.  Do exercises as told by your physical therapist.  If you have been sent home with a continuous passive motion machine, use it as told by your health care provider. Safety   Do not use your leg to  support your body weight until your health care provider approves. Use crutches or a walker as told by your health care provider.  Do not drive until your health care provider approves. Ask your health care provider when it is safe to drive. General instructions  Do not use any products that contain nicotine or tobacco, such as cigarettes, e-cigarettes, and chewing tobacco. These can delay healing after surgery. If you need help quitting, ask your health care provider.  Wear compression stockings as told by your health care provider.  Tell your health care provider if you plan to have dental work. Also, tell your dentist about your joint replacement.  Keep all follow-up visits as told by  your health care provider. This is important. Contact a health care provider if you have:  More redness, swelling, or pain around your incision or drain.  More fluid or blood coming from your incision or drain.  Pus or a bad smell coming from your incision or drain.  Warmth on your incision or drain site.  A fever.  An incision that breaks open.  Knee pain that does not go away.  Range of motion in your knee that is getting worse.  A prosthesis that feels loose. Get help right away if you have:  Pain or swelling in your calf or thigh.  Shortness of breath or difficulty breathing.  Chest pain. Summary  After the procedure, it is common to have pain and swelling, blood or fluid coming from your incision, and limited range of motion.  Follow instructions from your health care provider about how to take care of your incision.  Use crutches or a walker as told by your health care provider.  If you were prescribed a blood thinner (anticoagulant), take it as told by your health care provider.  Keep all follow-up visits as told by your health care provider. This is important. This information is not intended to replace advice given to you by your health care provider. Make sure you discuss  any questions you have with your health care provider. Document Released: 05/24/2005 Document Revised: 11/27/2018 Document Reviewed: 06/18/2018 Elsevier Interactive Patient Education  2020 [employer]  General Anesthesia, Adult, Care After This sheet gives you information about how to care for yourself after your procedure. Your health care provider may also give you more specific instructions. If you have problems or questions, contact your health care provider. What can I expect after the procedure? After the procedure, the following side effects are common:  Pain or discomfort at the IV site.  Nausea.  Vomiting.  Sore throat.  Trouble concentrating.  Feeling cold or chills.  Weak or tired.  Sleepiness and fatigue.  Soreness and body aches. These side effects can affect parts of the body that were not involved in surgery. Follow these instructions at home:  For at least 24 hours after the procedure:  Have a responsible adult stay with you. It is important to have someone help care for you until you are awake and alert.  Rest as needed.  Do not: ? Participate in activities in which you could fall or become injured. ? Drive. ? Use heavy machinery. ? Drink alcohol. ? Take sleeping pills or medicines that cause drowsiness. ? Make important decisions or sign legal documents. ? Take care of children on your own. Eating and drinking  Follow any instructions from your health care provider about eating or drinking restrictions.  When you feel hungry, start by eating small amounts of foods that are soft and easy to digest (bland), such as toast. Gradually return to your regular diet.  Drink enough fluid to keep your urine pale yellow.  If you vomit, rehydrate by drinking water, juice, or clear broth. General instructions  If you have sleep apnea, surgery and certain medicines can increase your risk for breathing problems. Follow instructions from your health care  provider about wearing your sleep device: ? Anytime you are sleeping, including during daytime naps. ? While taking prescription pain medicines, sleeping medicines, or medicines that make you drowsy.  Return to your normal activities as told by your health care provider. Ask your health care provider what activities are safe for you.  Take  over-the-counter and prescription medicines only as told by your health care provider.  If you smoke, do not smoke without supervision.  Keep all follow-up visits as told by your health care provider. This is important. Contact a health care provider if:  You have nausea or vomiting that does not get better with medicine.  You cannot eat or drink without vomiting.  You have pain that does not get better with medicine.  You are unable to pass urine.  You develop a skin rash.  You have a fever.  You have redness around your IV site that gets worse. Get help right away if:  You have difficulty breathing.  You have chest pain.  You have blood in your urine or stool, or you vomit blood. Summary  After the procedure, it is common to have a sore throat or nausea. It is also common to feel tired.  Have a responsible adult stay with you for the first 24 hours after general anesthesia. It is important to have someone help care for you until you are awake and alert.  When you feel hungry, start by eating small amounts of foods that are soft and easy to digest (bland), such as toast. Gradually return to your regular diet.  Drink enough fluid to keep your urine pale yellow.  Return to your normal activities as told by your health care provider. Ask your health care provider what activities are safe for you. This information is not intended to replace advice given to you by your health care provider. Make sure you discuss any questions you have with your health care provider. Document Released: 02/10/2001 Document Revised: 11/07/2017 Document  Reviewed: 06/20/2017 Elsevier Patient Education  2020 Elsevier Inc.  Spinal Anesthesia and Epidural Anesthesia, Care After This sheet gives you information about how to care for yourself after your procedure. Your doctor may also give you more specific instructions. If you have problems or questions, call your doctor. Follow these instructions at home: For at least 24 hours after the procedure:   Have a responsible adult stay with you. It is important to have someone help care for you until you are awake and alert.  Rest as needed.  Do not do activities where you could fall or get hurt (injured).  Do not drive.  Do not use heavy machinery.  Do not drink alcohol.  Do not take sleeping pills or medicines that make you sleepy (drowsy).  Do not make important decisions.  Do not sign legal documents.  Do not take care of children on your own. Eating and drinking  If you throw up (vomit), drink water, juice, or soup when nausea and vomiting stop.  Drink enough fluid to keep your pee (urine) pale yellow.  Make sure you do not feel like throwing up (nauseous) before you eat solid foods.  Follow the diet that your doctor recommends. General instructions  Return to your normal activities as told by your doctor. Ask your doctor what activities are safe for you.  Take over-the-counter and prescription medicines only as told by your doctor.  If you have sleep apnea, surgery and certain medicines can raise your risk for breathing problems. Follow instructions from your doctor about when to wear your sleep device. Your doctor may tell you to wear your sleep device: ? Anytime you are sleeping, including during daytime naps. ? While taking prescription pain medicines, sleeping pills, or medicines that make you sleepy.  Do not use any products that contain nicotine or tobacco.  This includes cigarettes and e-cigarettes. ? If you need help quitting, ask your doctor. ? If you smoke, do  not smoke by yourself. Make sure someone is nearby in case you need help.  Keep all follow-up visits as told by your doctor. This is important. Contact a doctor if:  It has been more than one day since your procedure and you feel like throwing up.  It has been more than one day since your procedure and you throw up.  You have a rash. Get help right away if:  You have a fever.  You have a headache that lasts a long time.  You have a very bad headache.  Your vision is blurry.  You see two of a single object (double vision).  You are dizzy or light-headed.  You faint.  Your arms or legs tingle, feel weak, or get numb.  You have trouble breathing.  You cannot pee (urinate). Summary  After the procedure, have a responsible adult stay with you at home until you are fully awake and alert.  Do not do activities that might get you injured. Do not drive, use heavy machinery, drink alcohol, or make important decisions for 24 hours after the procedure.  Take medicines as told by your doctor. Do not use products that contain nicotine or tobacco.  Get help right away if you have a fever, blurry vision, difficulty breathing or passing urine, or weakness or numbness in arms or legs. This information is not intended to replace advice given to you by your health care provider. Make sure you discuss any questions you have with your health care provider. Document Released: 02/26/2016 Document Revised: 10/17/2017 Document Reviewed: 02/26/2016 Elsevier Patient Education  2020 [employer]. How to Use Chlorhexidine for Bathing Chlorhexidine gluconate (CHG) is a germ-killing (antiseptic) solution that is used to clean the skin. It can get rid of the bacteria that normally live on the skin and can keep them away for about 24 hours. To clean your skin with CHG, you may be given:  A CHG solution to use in the shower or as part of a sponge bath.  A prepackaged cloth that contains CHG. Cleaning  your skin with CHG may help lower the risk for infection:  While you are staying in the intensive care unit of the hospital.  If you have a vascular access, such as a central line, to provide short-term or long-term access to your veins.  If you have a catheter to drain urine from your bladder.  If you are on a ventilator. A ventilator is a machine that helps you breathe by moving air in and out of your lungs.  After surgery. What are the risks? Risks of using CHG include:  A skin reaction.  Hearing loss, if CHG gets in your ears.  Eye injury, if CHG gets in your eyes and is not rinsed out.  The CHG product catching fire. Make sure that you avoid smoking and flames after applying CHG to your skin. Do not use CHG:  If you have a chlorhexidine allergy or have previously reacted to chlorhexidine.  On babies younger than 52 months of age. How to use CHG solution  Use CHG only as told by your health care provider, and follow the instructions on the label.  Use the full amount of CHG as directed. Usually, this is one bottle. During a shower Follow these steps when using CHG solution during a shower (unless your health care provider gives you different instructions):  1. Start the shower. 2. Use your normal soap and shampoo to wash your face and hair. 3. Turn off the shower or move out of the shower stream. 4. Pour the CHG onto a clean washcloth. Do not use any type of brush or rough-edged sponge. 5. Starting at your neck, lather your body down to your toes. Make sure you follow these instructions: ? If you will be having surgery, pay special attention to the part of your body where you will be having surgery. Scrub this area for at least 1 minute. ? Do not use CHG on your head or face. If the solution gets into your ears or eyes, rinse them well with water. ? Avoid your genital area. ? Avoid any areas of skin that have broken skin, cuts, or scrapes. ? Scrub your back and under your  arms. Make sure to wash skin folds. 6. Let the lather sit on your skin for 1-2 minutes or as long as told by your health care provider. 7. Thoroughly rinse your entire body in the shower. Make sure that all body creases and crevices are rinsed well. 8. Dry off with a clean towel. Do not put any substances on your body afterward-such as powder, lotion, or perfume-unless you are told to do so by your health care provider. Only use lotions that are recommended by the manufacturer. 9. Put on clean clothes or pajamas. 10. If it is the night before your surgery, sleep in clean sheets.  During a sponge bath Follow these steps when using CHG solution during a sponge bath (unless your health care provider gives you different instructions): 1. Use your normal soap and shampoo to wash your face and hair. 2. Pour the CHG onto a clean washcloth. 3. Starting at your neck, lather your body down to your toes. Make sure you follow these instructions: ? If you will be having surgery, pay special attention to the part of your body where you will be having surgery. Scrub this area for at least 1 minute. ? Do not use CHG on your head or face. If the solution gets into your ears or eyes, rinse them well with water. ? Avoid your genital area. ? Avoid any areas of skin that have broken skin, cuts, or scrapes. ? Scrub your back and under your arms. Make sure to wash skin folds. 4. Let the lather sit on your skin for 1-2 minutes or as long as told by your health care provider. 5. Using a different clean, wet washcloth, thoroughly rinse your entire body. Make sure that all body creases and crevices are rinsed well. 6. Dry off with a clean towel. Do not put any substances on your body afterward-such as powder, lotion, or perfume-unless you are told to do so by your health care provider. Only use lotions that are recommended by the manufacturer. 7. Put on clean clothes or pajamas. 8. If it is the night before your surgery,  sleep in clean sheets. How to use CHG prepackaged cloths  Only use CHG cloths as told by your health care provider, and follow the instructions on the label.  Use the CHG cloth on clean, dry skin.  Do not use the CHG cloth on your head or face unless your health care provider tells you to.  When washing with the CHG cloth: ? Avoid your genital area. ? Avoid any areas of skin that have broken skin, cuts, or scrapes. Before surgery Follow these steps when using a CHG cloth to  clean before surgery (unless your health care provider gives you different instructions): 1. Using the CHG cloth, vigorously scrub the part of your body where you will be having surgery. Scrub using a back-and-forth motion for 3 minutes. The area on your body should be completely wet with CHG when you are done scrubbing. 2. Do not rinse. Discard the cloth and let the area air-dry. Do not put any substances on the area afterward, such as powder, lotion, or perfume. 3. Put on clean clothes or pajamas. 4. If it is the night before your surgery, sleep in clean sheets.  For general bathing Follow these steps when using CHG cloths for general bathing (unless your health care provider gives you different instructions). 1. Use a separate CHG cloth for each area of your body. Make sure you wash between any folds of skin and between your fingers and toes. Wash your body in the following order, switching to a new cloth after each step: ? The front of your neck, shoulders, and chest. ? Both of your arms, under your arms, and your hands. ? Your stomach and groin area, avoiding the genitals. ? Your right leg and foot. ? Your left leg and foot. ? The back of your neck, your back, and your buttocks. 2. Do not rinse. Discard the cloth and let the area air-dry. Do not put any substances on your body afterward-such as powder, lotion, or perfume-unless you are told to do so by your health care provider. Only use lotions that are  recommended by the manufacturer. 3. Put on clean clothes or pajamas. Contact a health care provider if:  Your skin gets irritated after scrubbing.  You have questions about using your solution or cloth. Get help right away if:  Your eyes become very red or swollen.  Your eyes itch badly.  Your skin itches badly and is red or swollen.  Your hearing changes.  You have trouble seeing.  You have swelling or tingling in your mouth or throat.  You have trouble breathing.  You swallow any chlorhexidine. Summary  Chlorhexidine gluconate (CHG) is a germ-killing (antiseptic) solution that is used to clean the skin. Cleaning your skin with CHG may help to lower your risk for infection.  You may be given CHG to use for bathing. It may be in a bottle or in a prepackaged cloth to use on your skin. Carefully follow your health care provider's instructions and the instructions on the product label.  Do not use CHG if you have a chlorhexidine allergy.  Contact your health care provider if your skin gets irritated after scrubbing. This information is not intended to replace advice given to you by your health care provider. Make sure you discuss any questions you have with your health care provider. Document Released: 07/29/2012 Document Revised: 01/21/2019 Document Reviewed: 10/02/2017 Elsevier Patient Education  2020 [employer].

## 2019-09-16 ENCOUNTER — Telehealth: Payer: Self-pay | Admitting: Radiology

## 2019-09-16 NOTE — Telephone Encounter (Signed)
Called [employer] and the auth number stays same date of service has changed, auth # [id number] I have emailed [employer] [place]

## 2019-09-17 ENCOUNTER — Encounter (HOSPITAL_COMMUNITY)
Admission: RE | Admit: 2019-09-17 | Discharge: 2019-09-17 | Disposition: A | Discharge status: Home / Self Care | Payer: PRIVATE HEALTH INSURANCE | Source: Ambulatory Visit | Attending: Orthopedic Surgery | Admitting: Orthopedic Surgery

## 2019-09-17 ENCOUNTER — Other Ambulatory Visit: Payer: Self-pay

## 2019-09-17 DIAGNOSIS — Z01812 Encounter for preprocedural laboratory examination: Secondary | ICD-10-CM | POA: Diagnosis not present

## 2019-09-17 LAB — TYPE AND SCREEN
ABO/RH(D): A POS
Antibody Screen: NEGATIVE

## 2019-09-17 LAB — SURGICAL PCR SCREEN
MRSA, PCR: NEGATIVE
Staphylococcus aureus: NEGATIVE

## 2019-09-20 ENCOUNTER — Encounter: Payer: Self-pay | Admitting: Orthopedic Surgery

## 2019-09-20 ENCOUNTER — Telehealth: Payer: Self-pay

## 2019-09-20 ENCOUNTER — Telehealth: Payer: Self-pay | Admitting: Radiology

## 2019-09-20 NOTE — Telephone Encounter (Signed)
Letter done and faxed to patient's employer as noted, [employer], fax# [phone], ph# [phone]; also cc'd to [employer], fax [phone]; patient aware. Authorization/release forms on file.

## 2019-09-20 NOTE — Telephone Encounter (Signed)
Patient needs note sent to [facility]  Stating Dr Aline Brochure has RS his surgery to Nov 17th due to unforseen circumstances  Can you do this for me?  He said we have the number

## 2019-09-20 NOTE — Telephone Encounter (Signed)
Patient wants you to call him in reference to his surgery. [phone]

## 2019-09-20 NOTE — Telephone Encounter (Signed)
I left message for Melvin Turner to call me back, his Totl knee replacement needs to be RS to Nov 17th.

## 2019-09-20 NOTE — Telephone Encounter (Signed)
I spoke to him, he voiced understanding, discussed with Dr Aline Brochure. His disability is giving him trouble, he has been out of work surgery already RS once due to covid. I told him if anything opens sooner I will let him know.

## 2019-09-28 ENCOUNTER — Other Ambulatory Visit: Payer: Self-pay | Admitting: Orthopedic Surgery

## 2019-09-28 DIAGNOSIS — M1712 Unilateral primary osteoarthritis, left knee: Secondary | ICD-10-CM

## 2019-09-30 ENCOUNTER — Other Ambulatory Visit: Payer: Self-pay | Admitting: Orthopedic Surgery

## 2019-09-30 NOTE — Patient Instructions (Addendum)
    KEONTA ALSIP  09/30/2019     @PREFPERIOPPHARMACY @   Your procedure is scheduled on  10/05/2019 .  Report to [facility] at  [place]  A.M.  Call this number if you have problems the morning of surgery:  [phone]   Remember:  Do not eat or drink after midnight.                        Take these medicines the morning of surgery with A SIP OF WATER  Amlodipine, baclofen, hydrocodone(if needed), hysingla, prilosec, flomax.    Do not wear jewelry, make-up or nail polish.  Do not wear lotions, powders, or perfumes. Please wear deodorant and brush your teeth.  Do not shave 48 hours prior to surgery.  Men may shave face and neck.  Do not bring valuables to the hospital.  [facility] is not responsible for any belongings or valuables.  Contacts, dentures or bridgework may not be worn into surgery.  Leave your suitcase in the car.  After surgery it may be brought to your room.  For patients admitted to the hospital, discharge time will be determined by your treatment team.  Patients discharged the day of surgery will not be allowed to drive home.   Name and phone number of your driver:   family Special instructions:  None  Please read over the following fact sheets that you were given. Pain Booklet, Coughing and Deep Breathing, Blood Transfusion Information, Total Joint Packet, MRSA Information, Surgical Site Infection Prevention, Anesthesia Post-op Instructions and Care and Recovery After Surgery

## 2019-10-04 ENCOUNTER — Other Ambulatory Visit: Payer: Self-pay

## 2019-10-04 ENCOUNTER — Encounter (HOSPITAL_COMMUNITY)
Admission: RE | Admit: 2019-10-04 | Discharge: 2019-10-04 | Disposition: A | Discharge status: Home / Self Care | Payer: No Typology Code available for payment source | Source: Ambulatory Visit | Attending: Orthopedic Surgery | Admitting: Orthopedic Surgery

## 2019-10-04 DIAGNOSIS — Z01812 Encounter for preprocedural laboratory examination: Secondary | ICD-10-CM | POA: Insufficient documentation

## 2019-10-04 LAB — SURGICAL PCR SCREEN
MRSA, PCR: NEGATIVE
Staphylococcus aureus: NEGATIVE

## 2019-10-04 LAB — PREPARE RBC (CROSSMATCH)

## 2019-10-04 NOTE — H&P (Signed)
Chief Complaint  Patient presents with  . Knee Pain      Recheck on left knee.    HPI The patient presents for evaluation of preop for left total knee replacement   He still has radicular pain in his left leg but wishes to proceed with left total knee replacement understanding that his pain that is going from the bottom of his foot to his lower back would not be relieved by knee surgery.  He acknowledges that he is having difficulty straightening his knee secondary to his hamstring spasms which is more likely related to degenerative disc disease lumbar spine   He is on heavy medication from pain management center which will be continued and required postoperatively as for the left knee   Location medial joint line Duration greater than 2 years Quality dull ache Severity 10 Associated with weightbearing   ROS    has a past medical history of Arthritis, BPH (benign prostatic hyperplasia), GERD (gastroesophageal reflux disease), and Hypertension.         Past Surgical History:  Procedure Laterality Date  . COLONOSCOPY      . KNEE ARTHROSCOPY Right 01/31/2015    Procedure: RIGHT KNEE ARTHROSCOPY, PARTIAL MEDIAL MENISECTOMY;  Surgeon: Sanjuana Kava, MD;  Location: [facility];  Service: Orthopedics;  Laterality: Right;  . TONSILLECTOMY      . TOTAL KNEE ARTHROPLASTY Right 02/03/2018    Procedure: TOTAL KNEE ARTHROPLASTY;  Surgeon: Carole Civil, MD;  Location: [facility];  Service: Orthopedics;  Laterality: Right;  Marland Kitchen VASECTOMY          Body mass index is 30.68 kg/m.         Allergies  Allergen Reactions  . Poison Ivy Extract [Poison Ivy Extract] Hives and Rash        Current Outpatient Medications:  .  amLODipine (NORVASC) 5 MG tablet, , Disp: , Rfl:  .  Ascorbic Acid (VITAMIN C CR) 1000 MG TBCR, Take 1 tablet by mouth daily., Disp: , Rfl:  .  baclofen (LIORESAL) 10 MG tablet, Take 10 mg by mouth 3 (three) times daily as needed., Disp: , Rfl:  .  benazepril (LOTENSIN) 20 MG  tablet, Take 20 mg by mouth daily., Disp: , Rfl:  .  HYDROcodone-acetaminophen (NORCO) 10-325 MG tablet, TAKE 1 TABLET BY MOUTH 4 TIMES DAILY AS NEEDED, Disp: , Rfl:  .  HYSINGLA ER 20 MG T24A, , Disp: , Rfl:  .  Multiple Vitamin (MULTIVITAMIN WITH MINERALS) TABS tablet, Take 1 tablet by mouth daily., Disp: , Rfl:  .  omeprazole (PRILOSEC) 20 MG capsule, , Disp: , Rfl:  .  tamsulosin (FLOMAX) 0.4 MG CAPS capsule, Take 0.4 mg by mouth., Disp: , Rfl:  .  L-ARGININE-500 PO, Take 1 tablet by mouth daily. , Disp: , Rfl:      PHYSICAL EXAM SECTION: 1) BP 123/66   Pulse 77   Temp 97.9 F (36.6 C)   Ht 5\' 11"  (1.803 m)   Wt 220 lb (99.8 kg)   BMI 30.68 kg/m   Body mass index is 30.68 kg/m. General appearance: Well-developed well-nourished no gross deformities  2) Cardiovascular normal pulse and perfusion in the lower extremities normal color without edema  3) Neurologically deep tendon reflexes are equal and normal, no sensation loss or deficits no pathologic reflexes   4) Psychological: Awake alert and oriented x3 mood and affect normal   5) Skin no lacerations or ulcerations no nodularity no palpable masses, no erythema or nodularity  6) Musculoskeletal:  Left knee has a 10 to 15 degree flexion contracture which I think is more likely related to hamstring spasm he flexes knee actively to 100 degrees Medial joint line tenderness severe, moderate lateral Extensor mechanism is intact skin is normal Knee is stable     MEDICAL DECISION SECTION:      Encounter Diagnoses  Name Primary?  . Chronic pain of left knee Yes  . Primary osteoarthritis of left knee        Imaging Prior imaging shows osteoarthritis medial compartment bone-on-bone varus mild deformity   Plan:  (Rx., Inj., surg., Frx, MRI/CT, XR:2)   The procedure has been fully reviewed with the patient; The risks and benefits of surgery have been discussed and explained and understood. Alternative treatment has also been  reviewed, questions were encouraged and answered. The postoperative plan is also been reviewed.   Left total knee   Fuller Canada, MD  10/04/2019 11:23 AM

## 2019-10-05 ENCOUNTER — Inpatient Hospital Stay (HOSPITAL_COMMUNITY): Payer: No Typology Code available for payment source | Admitting: Anesthesiology

## 2019-10-05 ENCOUNTER — Inpatient Hospital Stay (HOSPITAL_COMMUNITY)
Admission: RE | Admit: 2019-10-05 | Discharge: 2019-10-06 | DRG: 470 | Disposition: A | Discharge status: Home Health | Payer: No Typology Code available for payment source | Attending: Orthopedic Surgery | Admitting: Orthopedic Surgery

## 2019-10-05 ENCOUNTER — Encounter (HOSPITAL_COMMUNITY)
Admission: RE | Disposition: A | Discharge status: Home / Self Care | Payer: Self-pay | Source: Home / Self Care | Attending: Orthopedic Surgery

## 2019-10-05 ENCOUNTER — Inpatient Hospital Stay (HOSPITAL_COMMUNITY): Payer: No Typology Code available for payment source

## 2019-10-05 ENCOUNTER — Encounter (HOSPITAL_COMMUNITY): Payer: Self-pay | Admitting: *Deleted

## 2019-10-05 DIAGNOSIS — Z96652 Presence of left artificial knee joint: Secondary | ICD-10-CM

## 2019-10-05 DIAGNOSIS — Z79899 Other long term (current) drug therapy: Secondary | ICD-10-CM | POA: Diagnosis not present

## 2019-10-05 DIAGNOSIS — N400 Enlarged prostate without lower urinary tract symptoms: Secondary | ICD-10-CM | POA: Diagnosis present

## 2019-10-05 DIAGNOSIS — M5116 Intervertebral disc disorders with radiculopathy, lumbar region: Secondary | ICD-10-CM | POA: Diagnosis present

## 2019-10-05 DIAGNOSIS — Z96651 Presence of right artificial knee joint: Secondary | ICD-10-CM | POA: Diagnosis present

## 2019-10-05 DIAGNOSIS — I10 Essential (primary) hypertension: Secondary | ICD-10-CM | POA: Diagnosis present

## 2019-10-05 DIAGNOSIS — Z20828 Contact with and (suspected) exposure to other viral communicable diseases: Secondary | ICD-10-CM | POA: Diagnosis present

## 2019-10-05 DIAGNOSIS — M659 Synovitis and tenosynovitis, unspecified: Secondary | ICD-10-CM | POA: Diagnosis present

## 2019-10-05 DIAGNOSIS — M1712 Unilateral primary osteoarthritis, left knee: Principal | ICD-10-CM

## 2019-10-05 DIAGNOSIS — K219 Gastro-esophageal reflux disease without esophagitis: Secondary | ICD-10-CM | POA: Diagnosis present

## 2019-10-05 DIAGNOSIS — Z87891 Personal history of nicotine dependence: Secondary | ICD-10-CM | POA: Diagnosis not present

## 2019-10-05 HISTORY — PX: TOTAL KNEE ARTHROPLASTY: SHX125

## 2019-10-05 SURGERY — ARTHROPLASTY, KNEE, TOTAL
Anesthesia: Spinal | Site: Knee | Laterality: Left

## 2019-10-05 MED ORDER — ONDANSETRON HCL 4 MG/2ML IJ SOLN
INTRAMUSCULAR | Status: DC | PRN
  Administered 2019-10-05: 4 mg via INTRAVENOUS

## 2019-10-05 MED ORDER — ONDANSETRON HCL 4 MG/2ML IJ SOLN: 4.0000 mg | Freq: Once | INTRAMUSCULAR | Status: DC | PRN

## 2019-10-05 MED ORDER — ONDANSETRON HCL 4 MG/2ML IJ SOLN: 4.0000 mg | Freq: Four times a day (QID) | INTRAMUSCULAR | Status: DC | PRN

## 2019-10-05 MED ORDER — TRANEXAMIC ACID-NACL 1000-0.7 MG/100ML-% IV SOLN
1000.0000 mg | Freq: Once | INTRAVENOUS | Status: AC
  Administered 2019-10-05: 1000 mg via INTRAVENOUS
  Filled 2019-10-05: qty 100

## 2019-10-05 MED ORDER — ROPIVACAINE HCL 5 MG/ML IJ SOLN
INTRAMUSCULAR | Status: DC | PRN
  Administered 2019-10-05: 30 mL via PERINEURAL

## 2019-10-05 MED ORDER — ONDANSETRON HCL 4 MG PO TABS: 4.0000 mg | ORAL_TABLET | Freq: Four times a day (QID) | ORAL | Status: DC | PRN

## 2019-10-05 MED ORDER — CEFAZOLIN SODIUM-DEXTROSE 2-4 GM/100ML-% IV SOLN
2.0000 g | INTRAVENOUS | Status: AC
  Administered 2019-10-05: 2 g via INTRAVENOUS

## 2019-10-05 MED ORDER — PROPOFOL 500 MG/50ML IV EMUL
INTRAVENOUS | Status: DC | PRN
  Administered 2019-10-05: 100 ug/kg/min via INTRAVENOUS

## 2019-10-05 MED ORDER — CELECOXIB 400 MG PO CAPS
400.0000 mg | ORAL_CAPSULE | Freq: Once | ORAL | Status: AC
  Administered 2019-10-05: 400 mg via ORAL
  Filled 2019-10-05: qty 1

## 2019-10-05 MED ORDER — CELECOXIB 100 MG PO CAPS
200.0000 mg | ORAL_CAPSULE | Freq: Two times a day (BID) | ORAL | Status: DC
  Administered 2019-10-05 – 2019-10-06 (×2): 200 mg via ORAL
  Filled 2019-10-05 (×2): qty 2

## 2019-10-05 MED ORDER — TAMSULOSIN HCL 0.4 MG PO CAPS
0.4000 mg | ORAL_CAPSULE | Freq: Every day | ORAL | Status: DC
  Administered 2019-10-06: 0.4 mg via ORAL
  Filled 2019-10-05: qty 1

## 2019-10-05 MED ORDER — DEXAMETHASONE SODIUM PHOSPHATE 4 MG/ML IJ SOLN
INTRAMUSCULAR | Status: DC | PRN
  Administered 2019-10-05: 8 mg via PERINEURAL

## 2019-10-05 MED ORDER — CEFAZOLIN SODIUM-DEXTROSE 2-4 GM/100ML-% IV SOLN
INTRAVENOUS | Status: AC
  Filled 2019-10-05: qty 100

## 2019-10-05 MED ORDER — SODIUM CHLORIDE 0.9 % IV SOLN: INTRAVENOUS | Status: AC

## 2019-10-05 MED ORDER — GABAPENTIN 300 MG PO CAPS
300.0000 mg | ORAL_CAPSULE | Freq: Three times a day (TID) | ORAL | Status: DC
  Administered 2019-10-05 – 2019-10-06 (×3): 300 mg via ORAL
  Filled 2019-10-05 (×3): qty 1

## 2019-10-05 MED ORDER — PANTOPRAZOLE SODIUM 40 MG PO TBEC
40.0000 mg | DELAYED_RELEASE_TABLET | Freq: Every day | ORAL | Status: DC
  Administered 2019-10-06: 40 mg via ORAL
  Filled 2019-10-05: qty 1

## 2019-10-05 MED ORDER — ONDANSETRON HCL 4 MG/2ML IJ SOLN
INTRAMUSCULAR | Status: AC
  Filled 2019-10-05: qty 4

## 2019-10-05 MED ORDER — METHOCARBAMOL 1000 MG/10ML IJ SOLN: 500.0000 mg | Freq: Four times a day (QID) | INTRAVENOUS | Status: DC | PRN

## 2019-10-05 MED ORDER — BACLOFEN 10 MG PO TABS
10.0000 mg | ORAL_TABLET | Freq: Two times a day (BID) | ORAL | Status: DC
  Administered 2019-10-05 – 2019-10-06 (×2): 10 mg via ORAL
  Filled 2019-10-05 (×2): qty 1

## 2019-10-05 MED ORDER — HYDROMORPHONE HCL 1 MG/ML IJ SOLN
0.5000 mg | INTRAMUSCULAR | Status: DC | PRN
  Administered 2019-10-05 – 2019-10-06 (×3): 1 mg via INTRAVENOUS
  Filled 2019-10-05 (×3): qty 1

## 2019-10-05 MED ORDER — METHOCARBAMOL 1000 MG/10ML IJ SOLN
500.0000 mg | Freq: Once | INTRAVENOUS | Status: AC
  Administered 2019-10-05: 500 mg via INTRAVENOUS
  Filled 2019-10-05: qty 500

## 2019-10-05 MED ORDER — TRAMADOL HCL 50 MG PO TABS
50.0000 mg | ORAL_TABLET | Freq: Four times a day (QID) | ORAL | Status: DC
  Administered 2019-10-05 – 2019-10-06 (×4): 50 mg via ORAL
  Filled 2019-10-05 (×4): qty 1

## 2019-10-05 MED ORDER — AMLODIPINE BESYLATE 5 MG PO TABS
5.0000 mg | ORAL_TABLET | Freq: Every day | ORAL | Status: DC
  Administered 2019-10-06: 5 mg via ORAL
  Filled 2019-10-05: qty 1

## 2019-10-05 MED ORDER — ARGININE 500 MG PO CAPS: ORAL_CAPSULE | Freq: Every day | ORAL | Status: DC

## 2019-10-05 MED ORDER — BUPIVACAINE IN DEXTROSE 0.75-8.25 % IT SOLN
INTRATHECAL | Status: DC | PRN
  Administered 2019-10-05: 2 mL via INTRATHECAL

## 2019-10-05 MED ORDER — FENTANYL CITRATE (PF) 250 MCG/5ML IJ SOLN
INTRAMUSCULAR | Status: AC
  Filled 2019-10-05: qty 5

## 2019-10-05 MED ORDER — CEFAZOLIN SODIUM-DEXTROSE 2-4 GM/100ML-% IV SOLN
2.0000 g | Freq: Four times a day (QID) | INTRAVENOUS | Status: AC
  Administered 2019-10-05 (×2): 2 g via INTRAVENOUS
  Filled 2019-10-05 (×2): qty 100

## 2019-10-05 MED ORDER — SEVOFLURANE IN SOLN
RESPIRATORY_TRACT | Status: AC
  Filled 2019-10-05: qty 250

## 2019-10-05 MED ORDER — BUPIVACAINE LIPOSOME 1.3 % IJ SUSP
INTRAMUSCULAR | Status: DC | PRN
  Administered 2019-10-05: 20 mL

## 2019-10-05 MED ORDER — OXYCODONE HCL 5 MG PO TABS
5.0000 mg | ORAL_TABLET | Freq: Once | ORAL | Status: AC
  Administered 2019-10-05: 5 mg via ORAL
  Filled 2019-10-05: qty 1

## 2019-10-05 MED ORDER — ROPIVACAINE HCL 5 MG/ML IJ SOLN
INTRAMUSCULAR | Status: AC
  Filled 2019-10-05: qty 30

## 2019-10-05 MED ORDER — MIDAZOLAM HCL 5 MG/5ML IJ SOLN
INTRAMUSCULAR | Status: DC | PRN
  Administered 2019-10-05: 2 mg via INTRAVENOUS

## 2019-10-05 MED ORDER — MEPERIDINE HCL 50 MG/ML IJ SOLN: 6.2500 mg | INTRAMUSCULAR | Status: DC | PRN

## 2019-10-05 MED ORDER — METOCLOPRAMIDE HCL 5 MG/ML IJ SOLN: 5.0000 mg | Freq: Three times a day (TID) | INTRAMUSCULAR | Status: DC | PRN

## 2019-10-05 MED ORDER — DEXAMETHASONE SODIUM PHOSPHATE 10 MG/ML IJ SOLN
10.0000 mg | Freq: Once | INTRAMUSCULAR | Status: AC
  Administered 2019-10-06: 10 mg via INTRAVENOUS
  Filled 2019-10-05: qty 1

## 2019-10-05 MED ORDER — POLYETHYLENE GLYCOL 3350 17 G PO PACK
17.0000 g | PACK | Freq: Every day | ORAL | Status: DC
  Administered 2019-10-06: 17 g via ORAL
  Filled 2019-10-05: qty 1

## 2019-10-05 MED ORDER — LACTATED RINGERS IV SOLN
INTRAVENOUS | Status: DC | PRN
  Administered 2019-10-05 (×3): via INTRAVENOUS

## 2019-10-05 MED ORDER — DEXAMETHASONE SODIUM PHOSPHATE 4 MG/ML IJ SOLN
INTRAMUSCULAR | Status: AC
  Filled 2019-10-05: qty 2

## 2019-10-05 MED ORDER — ADULT MULTIVITAMIN W/MINERALS CH
1.0000 | ORAL_TABLET | Freq: Every day | ORAL | Status: DC
  Administered 2019-10-05 – 2019-10-06 (×2): 1 via ORAL
  Filled 2019-10-05 (×2): qty 1

## 2019-10-05 MED ORDER — METHOCARBAMOL 500 MG PO TABS: 500.0000 mg | ORAL_TABLET | Freq: Four times a day (QID) | ORAL | Status: DC | PRN

## 2019-10-05 MED ORDER — PREGABALIN 50 MG PO CAPS
50.0000 mg | ORAL_CAPSULE | Freq: Once | ORAL | Status: AC
  Administered 2019-10-05: 50 mg via ORAL
  Filled 2019-10-05: qty 1

## 2019-10-05 MED ORDER — METOCLOPRAMIDE HCL 10 MG PO TABS: 5.0000 mg | ORAL_TABLET | Freq: Three times a day (TID) | ORAL | Status: DC | PRN

## 2019-10-05 MED ORDER — TRANEXAMIC ACID-NACL 1000-0.7 MG/100ML-% IV SOLN
1000.0000 mg | INTRAVENOUS | Status: AC
  Administered 2019-10-05: 1000 mg via INTRAVENOUS

## 2019-10-05 MED ORDER — CHLORHEXIDINE GLUCONATE CLOTH 2 % EX PADS
6.0000 | MEDICATED_PAD | Freq: Every day | CUTANEOUS | Status: DC
  Administered 2019-10-06: 6 via TOPICAL

## 2019-10-05 MED ORDER — ASPIRIN EC 325 MG PO TBEC
325.0000 mg | DELAYED_RELEASE_TABLET | Freq: Every day | ORAL | Status: DC
  Administered 2019-10-06: 325 mg via ORAL
  Filled 2019-10-05: qty 1

## 2019-10-05 MED ORDER — PHENOL 1.4 % MT LIQD: 1.0000 | OROMUCOSAL | Status: DC | PRN

## 2019-10-05 MED ORDER — HYDROCODONE BITARTRATE ER 20 MG PO T24A: 20.0000 mg | EXTENDED_RELEASE_TABLET | Freq: Every day | ORAL | Status: DC

## 2019-10-05 MED ORDER — OXYCODONE HCL 5 MG PO TABS
10.0000 mg | ORAL_TABLET | ORAL | Status: DC | PRN
  Administered 2019-10-05: 10 mg via ORAL
  Filled 2019-10-05: qty 2

## 2019-10-05 MED ORDER — DIPHENHYDRAMINE HCL 12.5 MG/5ML PO ELIX: 12.5000 mg | ORAL_SOLUTION | ORAL | Status: DC | PRN

## 2019-10-05 MED ORDER — PROPOFOL 10 MG/ML IV BOLUS
INTRAVENOUS | Status: DC | PRN
  Administered 2019-10-05: 30 mg via INTRAVENOUS
  Administered 2019-10-05: 50 mg via INTRAVENOUS
  Administered 2019-10-05: 20 mg via INTRAVENOUS

## 2019-10-05 MED ORDER — MIDAZOLAM HCL 2 MG/2ML IJ SOLN
2.0000 mg | Freq: Once | INTRAMUSCULAR | Status: AC
  Administered 2019-10-05: 2 mg via INTRAVENOUS

## 2019-10-05 MED ORDER — MIDAZOLAM HCL 2 MG/2ML IJ SOLN
INTRAMUSCULAR | Status: AC
  Filled 2019-10-05: qty 2

## 2019-10-05 MED ORDER — PROPOFOL 10 MG/ML IV BOLUS
INTRAVENOUS | Status: AC
  Filled 2019-10-05: qty 20

## 2019-10-05 MED ORDER — LACTATED RINGERS IV SOLN
Freq: Once | INTRAVENOUS | Status: AC
  Administered 2019-10-05: 1000 mL via INTRAVENOUS

## 2019-10-05 MED ORDER — HYDROMORPHONE HCL 1 MG/ML IJ SOLN
INTRAMUSCULAR | Status: AC
  Filled 2019-10-05: qty 0.5

## 2019-10-05 MED ORDER — MENTHOL 3 MG MT LOZG: 1.0000 | LOZENGE | OROMUCOSAL | Status: DC | PRN

## 2019-10-05 MED ORDER — ONDANSETRON HCL 4 MG/2ML IJ SOLN
4.0000 mg | Freq: Once | INTRAMUSCULAR | Status: AC
  Administered 2019-10-05: 4 mg via INTRAVENOUS
  Filled 2019-10-05: qty 2

## 2019-10-05 MED ORDER — VITAMIN C 500 MG PO TABS
1000.0000 mg | ORAL_TABLET | Freq: Every day | ORAL | Status: DC
  Administered 2019-10-05 – 2019-10-06 (×2): 1000 mg via ORAL
  Filled 2019-10-05 (×2): qty 2

## 2019-10-05 MED ORDER — SODIUM CHLORIDE 0.9 % IR SOLN
Status: DC | PRN
  Administered 2019-10-05: 3000 mL

## 2019-10-05 MED ORDER — HYDROMORPHONE HCL 1 MG/ML IJ SOLN: 0.2500 mg | INTRAMUSCULAR | Status: DC | PRN

## 2019-10-05 MED ORDER — 0.9 % SODIUM CHLORIDE (POUR BTL) OPTIME
TOPICAL | Status: DC | PRN
  Administered 2019-10-05: 1000 mL

## 2019-10-05 MED ORDER — DOCUSATE SODIUM 100 MG PO CAPS
100.0000 mg | ORAL_CAPSULE | Freq: Two times a day (BID) | ORAL | Status: DC
  Administered 2019-10-05 – 2019-10-06 (×2): 100 mg via ORAL
  Filled 2019-10-05 (×2): qty 1

## 2019-10-05 MED ORDER — BENAZEPRIL HCL 10 MG PO TABS
20.0000 mg | ORAL_TABLET | Freq: Every day | ORAL | Status: DC
  Administered 2019-10-05 – 2019-10-06 (×2): 20 mg via ORAL
  Filled 2019-10-05 (×2): qty 2

## 2019-10-05 MED ORDER — HYDROMORPHONE HCL 1 MG/ML IJ SOLN
0.2500 mg | INTRAMUSCULAR | Status: DC | PRN
  Administered 2019-10-05 (×3): 0.5 mg via INTRAVENOUS
  Filled 2019-10-05 (×2): qty 0.5

## 2019-10-05 MED ORDER — ACETAMINOPHEN 500 MG PO TABS
1000.0000 mg | ORAL_TABLET | Freq: Four times a day (QID) | ORAL | Status: AC
  Administered 2019-10-05 – 2019-10-06 (×3): 1000 mg via ORAL
  Filled 2019-10-05 (×3): qty 2

## 2019-10-05 MED ORDER — CHLORHEXIDINE GLUCONATE 4 % EX LIQD: 60.0000 mL | Freq: Once | CUTANEOUS | Status: DC

## 2019-10-05 MED ORDER — FENTANYL CITRATE (PF) 100 MCG/2ML IJ SOLN
INTRAMUSCULAR | Status: DC | PRN
  Administered 2019-10-05 (×2): 50 ug via INTRAVENOUS

## 2019-10-05 MED ORDER — LIDOCAINE HCL (PF) 1 % IJ SOLN
INTRAMUSCULAR | Status: DC | PRN
  Administered 2019-10-05: 2 mL

## 2019-10-05 MED ORDER — TRANEXAMIC ACID-NACL 1000-0.7 MG/100ML-% IV SOLN
INTRAVENOUS | Status: AC
  Filled 2019-10-05: qty 100

## 2019-10-05 MED ORDER — BUPIVACAINE LIPOSOME 1.3 % IJ SUSP
INTRAMUSCULAR | Status: AC
  Filled 2019-10-05: qty 20

## 2019-10-05 MED ORDER — LIDOCAINE HCL (PF) 1 % IJ SOLN
INTRAMUSCULAR | Status: AC
  Filled 2019-10-05: qty 2

## 2019-10-05 SURGICAL SUPPLY — 70 items
BANDAGE ELASTIC 4 VELCRO NS (GAUZE/BANDAGES/DRESSINGS) ×6 IMPLANT
BANDAGE ELASTIC 6 VELCRO NS (GAUZE/BANDAGES/DRESSINGS) ×3 IMPLANT
BANDAGE ESMARK 6X9 LF (GAUZE/BANDAGES/DRESSINGS) ×1 IMPLANT
BLADE HEX COATED 2.75 (ELECTRODE) ×3 IMPLANT
BLADE SAGITTAL 25.0X1.27X90 (BLADE) ×2 IMPLANT
BLADE SAGITTAL 25.0X1.27X90MM (BLADE) ×1
BNDG CMPR 9X6 STRL LF SNTH (GAUZE/BANDAGES/DRESSINGS) ×1
BNDG ESMARK 6X9 LF (GAUZE/BANDAGES/DRESSINGS) ×3
CEMENT HV SMART SET (Cement) ×6 IMPLANT
CLOTH BEACON ORANGE TIMEOUT ST (SAFETY) ×3 IMPLANT
COOLER CRYO CUFF IC AND MOTOR (MISCELLANEOUS) ×3 IMPLANT
COVER LIGHT HANDLE STERIS (MISCELLANEOUS) ×6 IMPLANT
COVER WAND RF STERILE (DRAPES) ×3 IMPLANT
CUFF CRYO KNEE18X23 MED (MISCELLANEOUS) ×2 IMPLANT
CUFF TOURN SGL QUICK 34 (TOURNIQUET CUFF) ×3
CUFF TRNQT CYL 34X4.125X (TOURNIQUET CUFF) ×1 IMPLANT
DECANTER SPIKE VIAL GLASS SM (MISCELLANEOUS) ×3 IMPLANT
DRAPE BACK TABLE (DRAPES) ×3 IMPLANT
DRAPE EXTREMITY T 121X128X90 (DISPOSABLE) ×3 IMPLANT
DRSG MEPILEX BORDER 4X12 (GAUZE/BANDAGES/DRESSINGS) ×3 IMPLANT
DURAPREP 26ML APPLICATOR (WOUND CARE) ×6 IMPLANT
ELECT REM PT RETURN 9FT ADLT (ELECTROSURGICAL) ×3
ELECTRODE REM PT RTRN 9FT ADLT (ELECTROSURGICAL) ×1 IMPLANT
FEMUR CMTD LEFT SZ 5 (Knees) IMPLANT
FEMUR LEFT SZ 5 (Knees) ×3 IMPLANT
GLOVE BIO SURGEON STRL SZ7 (GLOVE) ×6 IMPLANT
GLOVE BIOGEL PI IND STRL 7.0 (GLOVE) ×2 IMPLANT
GLOVE BIOGEL PI INDICATOR 7.0 (GLOVE) ×8
GLOVE ECLIPSE 6.5 STRL STRAW (GLOVE) ×2 IMPLANT
GLOVE SKINSENSE NS SZ8.0 LF (GLOVE) ×4
GLOVE SKINSENSE STRL SZ8.0 LF (GLOVE) ×2 IMPLANT
GLOVE SS N UNI LF 8.5 STRL (GLOVE) ×3 IMPLANT
GOWN STRL REUS W/TWL LRG LVL3 (GOWN DISPOSABLE) ×9 IMPLANT
GOWN STRL REUS W/TWL XL LVL3 (GOWN DISPOSABLE) ×3 IMPLANT
HANDPIECE INTERPULSE COAX TIP (DISPOSABLE) ×3
HOOD W/PEELAWAY (MISCELLANEOUS) ×12 IMPLANT
INSERT CROSS LINKED SZ 4 10MM (Knees) ×2 IMPLANT
INST SET MAJOR BONE (KITS) ×3 IMPLANT
IV NS IRRIG 3000ML ARTHROMATIC (IV SOLUTION) ×3 IMPLANT
KIT BLADEGUARD II DBL (SET/KITS/TRAYS/PACK) ×3 IMPLANT
KIT TURNOVER KIT A (KITS) ×3 IMPLANT
MANIFOLD NEPTUNE II (INSTRUMENTS) ×3 IMPLANT
MARKER SKIN DUAL TIP RULER LAB (MISCELLANEOUS) ×3 IMPLANT
NDL HYPO 18GX1.5 BLUNT FILL (NEEDLE) ×1 IMPLANT
NDL HYPO 21X1.5 SAFETY (NEEDLE) ×1 IMPLANT
NEEDLE HYPO 18GX1.5 BLUNT FILL (NEEDLE) ×3 IMPLANT
NEEDLE HYPO 21X1.5 SAFETY (NEEDLE) ×3 IMPLANT
NS IRRIG 1000ML POUR BTL (IV SOLUTION) ×3 IMPLANT
PACK TOTAL JOINT (CUSTOM PROCEDURE TRAY) ×3 IMPLANT
PAD ARMBOARD 7.5X6 YLW CONV (MISCELLANEOUS) ×3 IMPLANT
PATELLA DOME PFC 38MM (Knees) ×2 IMPLANT
PILLOW KNEE EXTENSION 0 DEG (MISCELLANEOUS) ×3 IMPLANT
PIN THREADED HEADED SIGMA (PIN) ×2 IMPLANT
PIN/DRILL PACK ORTHO 1/8X3.0 (PIN) ×2 IMPLANT
SAW OSC TIP CART 19.5X105X1.3 (SAW) ×3 IMPLANT
SET BASIN LINEN APH (SET/KITS/TRAYS/PACK) ×3 IMPLANT
SET HNDPC FAN SPRY TIP SCT (DISPOSABLE) ×1 IMPLANT
STAPLER VISISTAT 35W (STAPLE) ×3 IMPLANT
SUT BRALON NAB BRD #1 30IN (SUTURE) ×3 IMPLANT
SUT MNCRL 0 VIOLET CTX 36 (SUTURE) ×1 IMPLANT
SUT MON AB 0 CT1 (SUTURE) ×3 IMPLANT
SUT MONOCRYL 0 CTX 36 (SUTURE) ×2
SYR 20ML LL LF (SYRINGE) ×9 IMPLANT
SYR BULB IRRIGATION 50ML (SYRINGE) ×3 IMPLANT
TOWEL OR 17X26 4PK STRL BLUE (TOWEL DISPOSABLE) ×3 IMPLANT
TOWER CARTRIDGE SMART MIX (DISPOSABLE) ×3 IMPLANT
TRAY FOLEY MTR SLVR 16FR STAT (SET/KITS/TRAYS/PACK) ×3 IMPLANT
TRAY TIBIAL MOD SZ 4.0 (Knees) ×2 IMPLANT
WATER STERILE IRR 1000ML POUR (IV SOLUTION) ×6 IMPLANT
YANKAUER SUCT 12FT TUBE ARGYLE (SUCTIONS) ×3 IMPLANT

## 2019-10-05 NOTE — Anesthesia Procedure Notes (Signed)
Spinal  Patient location during procedure: OR Start time: 10/05/2019 10:30 AM Staffing Resident/CRNA: Ollen Bowl, CRNA Preanesthetic Checklist Completed: patient identified, site marked, surgical consent, pre-op evaluation, timeout performed, IV checked, risks and benefits discussed and monitors and equipment checked Spinal Block Patient position: sitting Prep: Betadine Patient monitoring: heart rate, cardiac monitor, continuous pulse ox and blood pressure Approach: midline Location: L3-4 Injection technique: single-shot Needle Needle type: Pencan  Needle gauge: 24 G Needle length: 10 cm Assessment Sensory level: T8 Additional Notes ATTEMPTS:1 TRAY ZO:[id number] TRAY EXPIRATION DATE:10/17/2020

## 2019-10-05 NOTE — Brief Op Note (Signed)
10/05/2019  12:30 PM  PATIENT:  Melvin Turner  63 y.o. 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  Femoral cut 11 mm Tibial cut 10 mm from the lateral side Patella was cut from 33mm to 13 mm  Sigma fixed bearing posterior stabilized DePuy total knee Size 4 tibia size 38 patella size 5 femur  SURGEON:  Surgeon(s) and Role:    Vickki Hearing, MD - Primary   Surgical dictation for left total knee  Preop diagnosis osteoarthritis left knee  Postop diagnosis osteoarthritis left knee  Surgeon Dr. Romeo Apple [phone]   The patient was identified in the preop holding area and the surgical site was confirmed as the left knee. Chart review and update were completed. The patient was taken to the operating room for spinal anesthesia. After successful spinal anesthesia Foley catheter was inserted. The patient was placed supine on the operating table.   the left leg was prepped with DuraPrep and draped sterilely. Timeout was completed. The limb was then exsanguinated a  6 inch Esmarch. The tourniquet was elevated to 300 mmHg.   A midline incision was made and taken down to the extensor mechanism followed by medial arthrotomy. The patella was everted. A synovectomy was performed as needed. The osteophytes were resected.  Anterior cruciate ligament and PCL and medial and lateral meniscus were resected.   a 3/8 inch drill bit was used to enter the femoral canal which was suctioned and irrigated until the fluid was clear. The distal femoral cut was set for 11 millimeter resection with a 5   Left Valgus angle. This cut was  completed and checked for flatness.   the tibia was subluxated forward and the external alignment guide was placed. We removed 10 mm of bone from the higher  lateral  side. We set the guide for neutral varus valgus cut related to the  Mechanical axis of the tibia and for slope matching the patient's anatomy. Rotational alignment was set using the tibial tubercle, tibial spine and second metatarsal. The cutting block was pinned and the proximal tibia was resected.     the femur was then measured to a size 5.  The cutting block was placed to match the epicondyles and the 4 distal cuts were made.    spacer blocks were placed starting with a 10 mm insert to confirm equal flexion-extension gaps. A size  10  mm insert balanced the gaps.   We placed the femoral notch cutting guide size 5  and resected the notch.   Trial implants were placed using appropriate size femur , appropriate size tibial baseplate which was measured after the proximal tibia resection. Tibial rotation was set patella tracking was normal   The tibia was then punched per manufacture technique making sure to avoid internal rotation.   The patella measured a size 24   We resected down to a size 13 using a size 38/9 button.   Final range of motion check was performed with the appropriate size trials as mentioned above. Satisfactory reduction and motion were obtained.   Trial implants were removed. The bone was irrigated and dried and the cement was mixed on the back table  exparel was injected in the soft tissues and posterior capsule of the knee  These implants were then cemented in place. Excess cement was removed. The cement was allowed to cure. Second irrigation was performed.    FInal range of motion check and stability check was completed  The wound was irrigated third time Hemovac drain was placed, extensor mechanism was closed with #1 Nurolon followed by 0 Monocryl and staples to reapproximate the skin edges and  subcutaneous tissue.   Sterile dressing was applied  The patient was taken recovery in stable condition    PHYSICIAN ASSISTANT:   ASSISTANTS: cynthia wrenn debbi dallas   ANESTHESIA:   spinal and knee block   EBL:  minimal   BLOOD ADMINISTERED:none  DRAINS: none   LOCAL MEDICATIONS USED:  OTHER exparel 20 cc full strength   SPECIMEN:  No Specimen  DISPOSITION OF SPECIMEN:  N/A  COUNTS:  YES  TOURNIQUET:   Total Tourniquet Time Documented: Thigh (Left) - 79 minutes Total: Thigh (Left) - 79 minutes   DICTATION: .Viviann Spare Dictation  PLAN OF CARE: Admit to inpatient   PATIENT DISPOSITION:  PACU - hemodynamically stable.   Delay start of Pharmacological VTE agent (>24hrs) due to surgical blood loss or risk of bleeding: yes

## 2019-10-05 NOTE — Interval H&P Note (Signed)
History and Physical Interval Note:  10/05/2019 9:49 AM  Melvin Turner  has presented today for surgery, with the diagnosis of left knee osteoarthritis.  The various methods of treatment have been discussed with the patient and family. After consideration of risks, benefits and other options for treatment, the patient has consented to  Procedure(s): TOTAL KNEE ARTHROPLASTY (Left) as a surgical intervention.  The patient's history has been reviewed, patient examined, no change in status, stable for surgery.  I have reviewed the patient's chart and labs.  Questions were answered to the patient's satisfaction.     Arther Abbott

## 2019-10-05 NOTE — Anesthesia Procedure Notes (Addendum)
Anesthesia Regional Block: Adductor canal block   Pre-Anesthetic Checklist: ,, timeout performed, Correct Patient, Correct Site, Correct Laterality, Correct Procedure, Correct Position, site marked, Risks and benefits discussed,  Surgical consent,  Pre-op evaluation,  At surgeon's request and post-op pain management  Laterality: Left  Prep: chloraprep, prepped with other, sterile gloves       Needles:   Needle Type: Echogenic Stimulator Needle     Needle Length: 10cm  Needle Gauge: 20     Additional Needles:   Procedures:,,,, ultrasound used (permanent image in chart),,,,  Narrative:  Start time: 10/05/2019 9:53 AM End time: 10/05/2019 10:03 AM  Performed by: Personally  Anesthesiologist: Denese Killings, MD  Additional Notes:  local infiltration with 2 ml of 1% lidocaine and Ropivacaine 0.5% with dexamethasone 8 mg(total volume 30 ml) for block,

## 2019-10-05 NOTE — Transfer of Care (Signed)
Immediate Anesthesia Transfer of Care Note  Patient: Melvin Turner  Procedure(s) Performed: LEFT TOTAL KNEE ARTHROPLASTY (Left Knee)  Patient Location: PACU  Anesthesia Type:General and Spinal  Level of Consciousness: awake, alert  and oriented  Airway & Oxygen Therapy: Patient Spontanous Breathing  Post-op Assessment: Report given to RN  Post vital signs: Reviewed and stable  Last Vitals:  Vitals Value Taken Time  BP    Temp    Pulse 63 10/05/19 1237  Resp 15 10/05/19 1237  SpO2 97 % 10/05/19 1237  Vitals shown include unvalidated device data.  Last Pain:  Vitals:   10/05/19 0847  TempSrc: Oral  PainSc: 8       Patients Stated Pain Goal: 9 (56/31/49 7026)  Complications: No apparent anesthesia complications

## 2019-10-05 NOTE — Evaluation (Signed)
Physical Therapy Evaluation Patient Details Name: Melvin Turner MRN: [id number] DOB: June 22, 1956 Today's Date: 10/05/2019   LEFT KNEE ROM: 3 - 72 degrees AMBULATION DISTANCE: 22 feet using RW with Min Guard/Supervision    History of Present Illness  Melvin Turner is a 63 y/o male, s/p Left TKA 10/05/19 with the diagnosis of OA left knee  Clinical Impression  Patient limited for left knee end range flexion/extension due to increased pain, limited for left heel to toe stepping due to increasing knee pain, able to ambulate up to doorway and back, put back to bed with left heel resting on bone foam and patient's spouse present at bedside.  Patient will benefit from continued physical therapy in hospital and recommended venue below to increase strength, balance, endurance for safe ADLs and gait.    Follow Up Recommendations Home health PT;Supervision for mobility/OOB;Supervision - Intermittent    Equipment Recommendations  Rolling walker with 5" wheels    Recommendations for Other Services       Precautions / Restrictions Precautions Precautions: Fall Restrictions Weight Bearing Restrictions: Yes LLE Weight Bearing: Weight bearing as tolerated      Mobility  Bed Mobility Overal bed mobility: Modified Independent             General bed mobility comments: increased time  Transfers Overall transfer level: Needs assistance Equipment used: Rolling walker (2 wheeled) Transfers: Sit to/from [facility] Sit to Stand: Min guard;Min assist Stand pivot transfers: Min guard       General transfer comment: diffiuclty for sit to stands due to pain/weakness LLE  Ambulation/Gait Ambulation/Gait assistance: Supervision;Min guard Gait Distance (Feet): 22 Feet Assistive device: Rolling walker (2 wheeled) Gait Pattern/deviations: Decreased step length - left;Decreased stance time - left;Decreased stride length Gait velocity: decreased   General Gait Details:  slightly labored cadence with limited weightbearing on LLE due to increased knee pain  Stairs            Wheelchair Mobility    Modified Rankin (Stroke Patients Only)       Balance Overall balance assessment: Needs assistance Sitting-balance support: Feet supported;No upper extremity supported Sitting balance-Leahy Scale: Good Sitting balance - Comments: seated at EOB   Standing balance support: During functional activity;Bilateral upper extremity supported Standing balance-Leahy Scale: Fair Standing balance comment: using RW                             Pertinent Vitals/Pain Pain Assessment: 0-10 Pain Score: 9  Pain Location: left knee Pain Descriptors / Indicators: Sore;Aching Pain Intervention(s): Limited activity within patient's tolerance;Monitored during session;Patient requesting pain meds-RN notified    Home Living Family/patient expects to be discharged to:: Private residence   Available Help at Discharge: Family;Available PRN/intermittently Type of Home: House Home Access: Stairs to enter Entrance Stairs-Rails: None Entrance Stairs-Number of Steps: 1 Home Layout: One level Home Equipment: Cane - single point      Prior Function Level of Independence: Independent         Comments: Hydrographic surveyor, drives     Hand Dominance   Dominant Hand: Right    Extremity/Trunk Assessment   Upper Extremity Assessment Upper Extremity Assessment: Overall WFL for tasks assessed    Lower Extremity Assessment Lower Extremity Assessment: Overall WFL for tasks assessed;LLE deficits/detail LLE Deficits / Details: grossly -4/5 LLE Sensation: WNL LLE Coordination: WNL    Cervical / Trunk Assessment Cervical / Trunk Assessment: Normal  Communication  Communication: No difficulties  Cognition Arousal/Alertness: Awake/alert Behavior During Therapy: WFL for tasks assessed/performed Overall Cognitive Status: Within Functional Limits for tasks  assessed                                        General Comments      Exercises Total Joint Exercises Ankle Circles/Pumps: Supine;5 reps;Strengthening;AROM;Left Quad Sets: Supine;5 reps;Left;Strengthening;AROM Short Arc Quad: Supine;5 reps;Left;Strengthening;AROM Heel Slides: Supine;5 reps;Left;Strengthening;AROM Goniometric ROM: left knee ROM: 3 - 72 degrees   Assessment/Plan    PT Assessment Patient needs continued PT services  PT Problem List Decreased strength;Decreased range of motion;Decreased activity tolerance;Decreased balance;Decreased mobility       PT Treatment Interventions Gait training;Stair training;Functional mobility training;Therapeutic activities;Therapeutic exercise;Patient/family education    PT Goals (Current goals can be found in the Care Plan section)  Acute Rehab PT Goals Patient Stated Goal: return home with family to assist PT Goal Formulation: With patient/family Time For Goal Achievement: 10/08/19 Potential to Achieve Goals: Good    Frequency BID   Barriers to discharge        Co-evaluation               AM-PAC PT "6 Clicks" Mobility  Outcome Measure Help needed turning from your back to your side while in a flat bed without using bedrails?: None Help needed moving from lying on your back to sitting on the side of a flat bed without using bedrails?: None Help needed moving to and from a bed to a chair (including a wheelchair)?: A Little Help needed standing up from a chair using your arms (e.g., wheelchair or bedside chair)?: A Little Help needed to walk in hospital room?: A Little Help needed climbing 3-5 steps with a railing? : A Lot 6 Click Score: 19    End of Session   Activity Tolerance: Patient tolerated treatment well;Patient limited by fatigue;Patient limited by pain Patient left: in bed;with call bell/phone within reach;with family/visitor present Nurse Communication: Mobility status PT Visit Diagnosis:  Unsteadiness on feet (R26.81);Other abnormalities of gait and mobility (R26.89);Muscle weakness (generalized) (M62.81)    Time: 5188-4166 PT Time Calculation (min) (ACUTE ONLY): 28 min   Charges:   PT Evaluation $PT Eval Moderate Complexity: 1 Mod PT Treatments $Therapeutic Activity: 23-37 mins        4:55 PM, 10/05/19 Ocie Bob, MPT Physical Therapist with [facility] [phone] office [phone] mobile phone

## 2019-10-05 NOTE — Anesthesia Procedure Notes (Signed)
Procedure Name: LMA Insertion Date/Time: 10/05/2019 11:38 AM Performed by: Ollen Bowl, CRNA Pre-anesthesia Checklist: Patient identified, Patient being monitored, Emergency Drugs available, Timeout performed and Suction available Patient Re-evaluated:Patient Re-evaluated prior to induction Oxygen Delivery Method: Circle System Utilized Preoxygenation: Pre-oxygenation with 100% oxygen Induction Type: IV induction Ventilation: Mask ventilation without difficulty LMA: LMA inserted LMA Size: 4.0 Number of attempts: 1 Placement Confirmation: positive ETCO2 and breath sounds checked- equal and bilateral

## 2019-10-05 NOTE — Op Note (Signed)
10/05/2019  12:30 PM  PATIENT:  Melvin Turner  63 y.o. 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  Femoral cut 11 mm Tibial cut 10 mm from the lateral side Patella was cut from 33mm to 13 mm  Sigma fixed bearing posterior stabilized DePuy total knee Size 4 tibia size 38 patella size 5 femur  SURGEON:  Surgeon(s) and Role:    Vickki Hearing, MD - Primary   Surgical dictation for left total knee  Preop diagnosis osteoarthritis left knee  Postop diagnosis osteoarthritis left knee  Surgeon Dr. Romeo Apple [phone]   The patient was identified in the preop holding area and the surgical site was confirmed as the left knee. Chart review and update were completed. The patient was taken to the operating room for spinal anesthesia. After successful spinal anesthesia Foley catheter was inserted. The patient was placed supine on the operating table.   the left leg was prepped with DuraPrep and draped sterilely. Timeout was completed. The limb was then exsanguinated a  6 inch Esmarch. The tourniquet was elevated to 300 mmHg.   A midline incision was made and taken down to the extensor mechanism followed by medial arthrotomy. The patella was everted. A synovectomy was performed as needed. The osteophytes were resected.  Anterior cruciate ligament and PCL and medial and lateral meniscus were resected.   a 3/8 inch drill bit was used to enter the femoral canal which was suctioned and irrigated until the fluid was clear. The distal femoral cut was set for 11 millimeter resection with a 5   Left Valgus angle. This cut was  completed and checked for flatness.   the tibia was subluxated forward and the external alignment guide was placed. We removed 10 mm of bone from the higher  lateral  side. We set the guide for neutral varus valgus cut related to the  Mechanical axis of the tibia and for slope matching the patient's anatomy. Rotational alignment was set using the tibial tubercle, tibial spine and second metatarsal. The cutting block was pinned and the proximal tibia was resected.     the femur was then measured to a size 5.  The cutting block was placed to match the epicondyles and the 4 distal cuts were made.    spacer blocks were placed starting with a 10 mm insert to confirm equal flexion-extension gaps. A size  10  mm insert balanced the gaps.   We placed the femoral notch cutting guide size 5  and resected the notch.   Trial implants were placed using appropriate size femur , appropriate size tibial baseplate which was measured after the proximal tibia resection. Tibial rotation was set patella tracking was normal   The tibia was then punched per manufacture technique making sure to avoid internal rotation.   The patella measured a size 24   We resected down to a size 13 using a size 38/9 button.   Final range of motion check was performed with the appropriate size trials as mentioned above. Satisfactory reduction and motion were obtained.   Trial implants were removed. The bone was irrigated and dried and the cement was mixed on the back table  exparel was injected in the soft tissues and posterior capsule of the knee  These implants were then cemented in place. Excess cement was removed. The cement was allowed to cure. Second irrigation was performed.    FInal range of motion check and stability check was completed  The wound was irrigated third time Hemovac drain was placed, extensor mechanism was closed with #1 Nurolon followed by 0 Monocryl and staples to reapproximate the skin edges and  subcutaneous tissue.   Sterile dressing was applied  The patient was taken recovery in stable condition    PHYSICIAN ASSISTANT:   ASSISTANTS: cynthia wrenn debbi dallas   ANESTHESIA:   spinal and knee block   EBL:  minimal   BLOOD ADMINISTERED:none  DRAINS: none   LOCAL MEDICATIONS USED:  OTHER exparel 20 cc full strength   SPECIMEN:  No Specimen  DISPOSITION OF SPECIMEN:  N/A  COUNTS:  YES  TOURNIQUET:   Total Tourniquet Time Documented: Thigh (Left) - 79 minutes Total: Thigh (Left) - 79 minutes   DICTATION: .Viviann Spare Dictation  PLAN OF CARE: Admit to inpatient   PATIENT DISPOSITION:  PACU - hemodynamically stable.   Delay start of Pharmacological VTE agent (>24hrs) due to surgical blood loss or risk of bleeding: yes

## 2019-10-05 NOTE — Plan of Care (Signed)
  Problem: Acute Rehab PT Goals(only PT should resolve) Goal: Pt Will Go Supine/Side To Sit Outcome: Progressing Flowsheets (Taken 10/05/2019 1656) Pt will go Supine/Side to Sit: Independently Goal: Patient Will Transfer Sit To/From Stand Outcome: Progressing Flowsheets (Taken 10/05/2019 1656) Patient will transfer sit to/from stand: with modified independence Goal: Pt Will Transfer Bed To Chair/Chair To Bed Outcome: Progressing Flowsheets (Taken 10/05/2019 1656) Pt will Transfer Bed to Chair/Chair to Bed: with modified independence Goal: Pt Will Ambulate Outcome: Progressing Flowsheets (Taken 10/05/2019 1656) Pt will Ambulate:  > 125 feet  with modified independence  with rolling walker   4:57 PM, 10/05/19 [name], MPT Physical Therapist with [facility] 336 [phone] office [phone] mobile phone

## 2019-10-05 NOTE — Anesthesia Postprocedure Evaluation (Signed)
Anesthesia Post Note  Patient: Melvin Turner  Procedure(s) Performed: LEFT TOTAL KNEE ARTHROPLASTY (Left Knee)  Patient location during evaluation: PACU Anesthesia Type: Combined General/Spinal Level of consciousness: awake and alert and patient cooperative Pain management: satisfactory to patient Vital Signs Assessment: post-procedure vital signs reviewed and stable Respiratory status: spontaneous breathing and patient connected to nasal cannula oxygen Cardiovascular status: stable Postop Assessment: no apparent nausea or vomiting and spinal receding Anesthetic complications: no     Last Vitals:  Vitals:   10/05/19 0847 10/05/19 1237  BP: 138/86 126/85  Pulse: 75 69  Resp: 20 (!) 23  Temp: 36.7 C (!) 36.4 C  SpO2: 96% 95%    Last Pain:  Vitals:   10/05/19 1237  TempSrc:   PainSc: 4                  Emmali Karow

## 2019-10-05 NOTE — Anesthesia Preprocedure Evaluation (Addendum)
Anesthesia Evaluation  Patient identified by MRN, date of birth, ID band Patient awake    Reviewed: Allergy & Precautions, NPO status , Patient's Chart, lab work & pertinent test results  Airway Mallampati: II  TM Distance: >3 FB Neck ROM: Full    Dental  (+) Dental Advisory Given   Pulmonary former smoker,    Pulmonary exam normal breath sounds clear to auscultation       Cardiovascular Exercise Tolerance: Good hypertension, Pt. on medications Normal cardiovascular exam Rhythm:Regular Rate:Normal     Neuro/Psych negative neurological ROS  negative psych ROS   GI/Hepatic Neg liver ROS, GERD  Medicated and Controlled,  Endo/Other  negative endocrine ROS  Renal/GU negative Renal ROS  negative genitourinary   Musculoskeletal  (+) Arthritis , Osteoarthritis,    Abdominal   Peds  Hematology   Anesthesia Other Findings   Reproductive/Obstetrics                            Anesthesia Physical Anesthesia Plan  ASA: II  Anesthesia Plan: General/Spinal   Post-op Pain Management:  [facility] for Post-op pain   Induction:   PONV Risk Score and Plan:   Airway Management Planned:   Additional Equipment:   Intra-op Plan:   Post-operative Plan:   Informed Consent: I have reviewed the patients History and Physical, chart, labs and discussed the procedure including the risks, benefits and alternatives for the proposed anesthesia with the patient or authorized representative who has indicated his/her understanding and acceptance.     Dental advisory given  Plan Discussed with: CRNA  Anesthesia Plan Comments: (Spinal and left adductor block)       Anesthesia Quick Evaluation

## 2019-10-06 ENCOUNTER — Other Ambulatory Visit: Payer: Self-pay | Admitting: Orthopedic Surgery

## 2019-10-06 ENCOUNTER — Telehealth: Payer: Self-pay | Admitting: Orthopedic Surgery

## 2019-10-06 ENCOUNTER — Encounter (HOSPITAL_COMMUNITY): Payer: Self-pay | Admitting: *Deleted

## 2019-10-06 ENCOUNTER — Telehealth: Payer: Self-pay | Admitting: Radiology

## 2019-10-06 ENCOUNTER — Ambulatory Visit: Payer: No Typology Code available for payment source | Admitting: Orthopedic Surgery

## 2019-10-06 DIAGNOSIS — M1712 Unilateral primary osteoarthritis, left knee: Secondary | ICD-10-CM

## 2019-10-06 DIAGNOSIS — Z96652 Presence of left artificial knee joint: Secondary | ICD-10-CM

## 2019-10-06 LAB — BASIC METABOLIC PANEL
Anion gap: 8 (ref 5–15)
BUN: 14 mg/dL (ref 8–23)
CO2: 24 mmol/L (ref 22–32)
Calcium: 8.9 mg/dL (ref 8.9–10.3)
Chloride: 106 mmol/L (ref 98–111)
Creatinine, Ser: 0.71 mg/dL (ref 0.61–1.24)
GFR calc Af Amer: >60 mL/min (ref >60)
GFR calc non Af Amer: >60 mL/min (ref >60)
Glucose, Bld: 166 mg/dL — ABNORMAL HIGH (ref 70–99)
Potassium: 3.9 mmol/L (ref 3.5–5.1)
Sodium: 138 mmol/L (ref 135–145)

## 2019-10-06 LAB — CBC
HCT: 41.4 % (ref 39.0–52.0)
Hemoglobin: 13.3 g/dL (ref 13.0–17.0)
MCH: 30.5 pg (ref 26.0–34.0)
MCHC: 32.1 g/dL (ref 30.0–36.0)
MCV: 95 fL (ref 80.0–100.0)
Platelets: 272 10*3/uL (ref 150–400)
RBC: 4.36 MIL/uL (ref 4.22–5.81)
RDW: 12.9 % (ref 11.5–15.5)
WBC: 19.9 10*3/uL — ABNORMAL HIGH (ref 4.0–10.5)
nRBC: 0 % (ref 0.0–0.2)

## 2019-10-06 MED ORDER — METHOCARBAMOL 500 MG PO TABS: 500.0000 mg | ORAL_TABLET | Freq: Four times a day (QID) | ORAL | 1 refills | Status: DC | PRN

## 2019-10-06 MED ORDER — DOCUSATE SODIUM 100 MG PO CAPS: 100.0000 mg | ORAL_CAPSULE | Freq: Two times a day (BID) | ORAL | 0 refills | Status: DC

## 2019-10-06 MED ORDER — OXYCODONE HCL 5 MG PO CAPS: 5.0000 mg | ORAL_CAPSULE | Freq: Four times a day (QID) | ORAL | 0 refills | Status: DC | PRN

## 2019-10-06 MED ORDER — POLYETHYLENE GLYCOL 3350 17 G PO PACK: 17.0000 g | PACK | Freq: Every day | ORAL | 0 refills | Status: DC

## 2019-10-06 MED ORDER — GABAPENTIN 300 MG PO CAPS: 300.0000 mg | ORAL_CAPSULE | Freq: Three times a day (TID) | ORAL | 0 refills | Status: DC

## 2019-10-06 MED ORDER — OXYCODONE HCL 10 MG PO TABS: ORAL_TABLET | ORAL | 0 refills | Status: DC

## 2019-10-06 NOTE — Progress Notes (Signed)
Patient ID: Melvin Turner, male   DOB: 1956-04-17, 63 y.o.   MRN: [id number] BP (!) 146/76   Pulse 68   Temp 98 F (36.7 C)   Resp 20   SpO2 96%   Postop day 1 status post left total knee patient is doing well pain is under control  Patient should be able to go home today as he did well yesterday.  Will recheck at noon time and if things are going well he can go home after 2 PM today

## 2019-10-06 NOTE — Progress Notes (Signed)
Nsg Discharge Note  Admit Date:  10/05/2019 Discharge date: 10/06/2019   Harrie Jeans to be D/C'd home  per MD order.  AVS completed.  Copy for chart, and copy for patient signed, and dated. Patient/caregiver able to verbalize understanding.  Discharge Medication: Allergies as of 10/06/2019      Reactions   Poison Ivy Extract [poison Ivy Extract] Hives, Rash      Medication List    STOP taking these medications   HYDROcodone-acetaminophen 10-325 MG tablet Commonly known as: NORCO     TAKE these medications   amLODipine 5 MG tablet Commonly known as: NORVASC   baclofen 10 MG tablet Commonly known as: LIORESAL Take 10 mg by mouth 3 (three) times daily as needed.   benazepril 20 MG tablet Commonly known as: LOTENSIN Take 20 mg by mouth daily.   docusate sodium 100 MG capsule Commonly known as: COLACE Take 1 capsule (100 mg total) by mouth 2 (two) times daily.   gabapentin 300 MG capsule Commonly known as: NEURONTIN Take 1 capsule (300 mg total) by mouth 3 (three) times daily.   Hysingla ER 20 MG T24a Generic drug: HYDROcodone Bitartrate ER   L-ARGININE-500 PO Take 1 tablet by mouth daily.   methocarbamol 500 MG tablet Commonly known as: ROBAXIN Take 1 tablet (500 mg total) by mouth every 6 (six) hours as needed for muscle spasms.   multivitamin with minerals Tabs tablet Take 1 tablet by mouth daily.   omeprazole 20 MG capsule Commonly known as: PRILOSEC   oxycodone 5 MG capsule Commonly known as: OXY-IR Take 1 capsule (5 mg total) by mouth every 6 (six) hours as needed for up to 5 days.   polyethylene glycol 17 g packet Commonly known as: MIRALAX / GLYCOLAX Take 17 g by mouth daily.   tamsulosin 0.4 MG Caps capsule Commonly known as: FLOMAX Take 0.4 mg by mouth.   Vitamin C CR 1000 MG Tbcr Take 1 tablet by mouth daily.            Durable Medical Equipment  (From admission, onward)         Start     Ordered   10/06/19 0851  For home use  only DME Walker rolling  Once    Question:  Patient needs a walker to treat with the following condition  Answer:  Total knee replacement status   10/06/19 0851          Discharge Assessment: Vitals:   10/06/19 0155 10/06/19 0500  BP: (!) 149/75 (!) 146/76  Pulse: 69 68  Resp: 20 20  Temp: 97.6 F (36.4 C) 98 F (36.7 C)  SpO2: 96% 96%   Skin clean, dry and intact without evidence of skin break down, no evidence of skin tears noted. IV catheter discontinued intact. Site without signs and symptoms of complications - no redness or edema noted at insertion site, patient denies c/o pain - only slight tenderness at site.  Dressing with slight pressure applied.  D/c Instructions-Education: Discharge instructions given to patient/family with verbalized understanding. D/c education completed with patient/family including follow up instructions, medication list, d/c activities limitations if indicated, with other d/c instructions as indicated by MD - patient able to verbalize understanding, all questions fully answered. Patient instructed to return to ED, call 911, or call MD for any changes in condition.  Patient escorted via [place], and D/C home via private auto.  Venita Sheffield, RN 10/06/2019 1:58 PM

## 2019-10-06 NOTE — Telephone Encounter (Signed)
I have called his wife to advise.

## 2019-10-06 NOTE — Telephone Encounter (Signed)
OK 

## 2019-10-06 NOTE — Progress Notes (Signed)
Physical Therapy Treatment Patient Details Name: Melvin Turner MRN: [id number] DOB: 08-05-1956 Today's Date: 10/06/2019   LEFT KNEE ROM:  0 - 105 degrees AMBULATION DISTANCE: 150 feet using RW with Mod Indep/Supervison    History of Present Illness Melvin Turner is a 63 y/o male, s/p Left TKA 10/05/19 with the diagnosis of OA left knee    PT Comments    Patient demonstrates increased endurance/distance for gait training without loss of balance, increased left knee flexion during self stretching while seated at bedside and good return for going up/down steps in stairwell using bilateral side rails.  Patient tolerated sitting up in chair with LLE dangling after therapy.  Patient will benefit from continued physical therapy in hospital and recommended venue below to increase strength, balance, endurance for safe ADLs and gait.   Follow Up Recommendations  Home health PT;Supervision for mobility/OOB;Supervision - Intermittent     Equipment Recommendations  Rolling walker with 5" wheels    Recommendations for Other Services       Precautions / Restrictions Precautions Precautions: Fall Restrictions Weight Bearing Restrictions: Yes LLE Weight Bearing: Weight bearing as tolerated    Mobility  Bed Mobility Overal bed mobility: Modified Independent             General bed mobility comments: HOB raised  Transfers Overall transfer level: Needs assistance Equipment used: Rolling walker (2 wheeled) Transfers: Sit to/from [employer] Transfers Sit to Stand: Supervision Stand pivot transfers: Modified independent (Device/Increase time)       General transfer comment: increased time, slightly labored, mild diffiuclty for sit to stands due to LLE weakness  Ambulation/Gait Ambulation/Gait assistance: Supervision;Modified independent (Device/Increase time) Gait Distance (Feet): 150 Feet Assistive device: Rolling walker (2 wheeled) Gait Pattern/deviations: Decreased step  length - left;Decreased stride length;Decreased stance time - left Gait velocity: decreased   General Gait Details: increased endurance/distance for ambulation with fair/good return for left heel to toe stepping without loss of balance   Stairs Stairs: Yes Stairs assistance: Modified independent (Device/Increase time);Supervision Stair Management: Two rails;Step to pattern Number of Stairs: 3 General stair comments: demonstrates good return for going up/down 3 steps using bilateral siderails without loss of balance   Wheelchair Mobility    Modified Rankin (Stroke Patients Only)       Balance Overall balance assessment: Needs assistance Sitting-balance support: Feet supported;No upper extremity supported Sitting balance-Leahy Scale: Good Sitting balance - Comments: seated at EOB   Standing balance support: During functional activity;Bilateral upper extremity supported Standing balance-Leahy Scale: Fair Standing balance comment: fair/good using RW                            Cognition Arousal/Alertness: Awake/alert Behavior During Therapy: WFL for tasks assessed/performed Overall Cognitive Status: Within Functional Limits for tasks assessed                                        Exercises Total Joint Exercises Ankle Circles/Pumps: Supine;Strengthening;AROM;Left;10 reps Quad Sets: Supine;Left;Strengthening;AROM;10 reps Short Arc Quad: Supine;Left;Strengthening;AROM;10 reps Heel Slides: Supine;Left;Strengthening;AROM;10 reps Goniometric ROM: left knee: 0 - 105 degrees    General Comments        Pertinent Vitals/Pain Pain Assessment: 0-10 Pain Score: 6  Pain Location: left knee Pain Descriptors / Indicators: Sore;Aching Pain Intervention(s): Limited activity within patient's tolerance;Monitored during session    Home Living  Prior Function            PT Goals (current goals can now be found in the care  plan section) Acute Rehab PT Goals Patient Stated Goal: return home with family to assist PT Goal Formulation: With patient/family Time For Goal Achievement: 10/08/19 Potential to Achieve Goals: Good Progress towards PT goals: Progressing toward goals    Frequency    BID      PT Plan Current plan remains appropriate    Co-evaluation              AM-PAC PT "6 Clicks" Mobility   Outcome Measure  Help needed turning from your back to your side while in a flat bed without using bedrails?: None Help needed moving from lying on your back to sitting on the side of a flat bed without using bedrails?: None Help needed moving to and from a bed to a chair (including a wheelchair)?: None Help needed standing up from a chair using your arms (e.g., wheelchair or bedside chair)?: A Little Help needed to walk in hospital room?: A Little Help needed climbing 3-5 steps with a railing? : A Little 6 Click Score: 21    End of Session   Activity Tolerance: Patient tolerated treatment well;Patient limited by fatigue Patient left: with call bell/phone within reach;in chair Nurse Communication: Mobility status PT Visit Diagnosis: Unsteadiness on feet (R26.81);Other abnormalities of gait and mobility (R26.89);Muscle weakness (generalized) (M62.81)     Time: 8657-8469 PT Time Calculation (min) (ACUTE ONLY): 26 min  Charges:  $Gait Training: 8-22 mins $Therapeutic Exercise: 8-22 mins                     9:51 AM, 10/06/19 [name], MPT Physical Therapist with [facility] [phone] office [phone] mobile phone

## 2019-10-06 NOTE — Telephone Encounter (Signed)
Patients wife is asking to skip the home therapy and have patient go for outpatient therapy s/p TKR, is this okay with you?

## 2019-10-06 NOTE — Discharge Summary (Signed)
Physician Discharge Summary  Patient ID: Melvin Turner MRN: [id number] DOB/AGE: 04/13/1956 63 y.o.  Admit date: 10/05/2019 Discharge date: 10/06/2019  Admission Diagnoses: Osteoarthritis left knee  Discharge Diagnoses: Same  Discharged Condition: stable  Procedure: Left total knee  Fixed-bearing Sigma DePuy total knee with 5 femur 4 tibia 38 patella  Hospital Course:  Surgery was done on the day of admission 05 October 2019 patient tolerated procedure well.  He had a nerve block preoperatively and then had spinal anesthesia.  His findings included osteoarthritis flexion contracture mild varus deformity of the left knee  On postop day 1 he was afebrile he was stable he was walking well he had 72 degrees of knee flexion and was allowed to be discharged home   Discharge Exam: BP (!) 146/76   Pulse 68   Temp 98 F (36.7 C)   Resp 20   SpO2 96%      Disposition: Discharge disposition: 01-Home or Self Care       Discharge Instructions    Call MD / Call 911   Complete by: As directed    If you experience chest pain or shortness of breath, CALL 911 and be transported to the hospital emergency room.  If you develope a fever above 101 F, pus (white drainage) or increased drainage or redness at the wound, or calf pain, call your surgeon's office.   Constipation Prevention   Complete by: As directed    Drink plenty of fluids.  Prune juice may be helpful.  You may use a stool softener, such as Colace (over the counter) 100 mg twice a day.  Use MiraLax (over the counter) for constipation as needed.   Diet - low sodium heart healthy   Complete by: As directed    Discharge instructions   Complete by: As directed    Blue bone foam 30 minutes 4 times a day  CPM 6 hours a day start at 70 degrees increase 10 degrees/day up to 110 degrees return after 2 weeks  Wear stockings both legs 2 weeks  Use Cryo/Cuff 6 times a day for 30 minutes more if needed to control swelling  Do  the exercises prescribed in the hospital  Change dressing Monday apply gauze and tape  No shower for 2 weeks   Driving restrictions   Complete by: As directed    No driving for 2 weeks   Increase activity slowly as tolerated   Complete by: As directed      Allergies as of 10/06/2019      Reactions   Poison Ivy Extract [poison Ivy Extract] Hives, Rash      Medication List    STOP taking these medications   HYDROcodone-acetaminophen 10-325 MG tablet Commonly known as: NORCO     TAKE these medications   amLODipine 5 MG tablet Commonly known as: NORVASC   baclofen 10 MG tablet Commonly known as: LIORESAL Take 10 mg by mouth 3 (three) times daily as needed.   benazepril 20 MG tablet Commonly known as: LOTENSIN Take 20 mg by mouth daily.   docusate sodium 100 MG capsule Commonly known as: COLACE Take 1 capsule (100 mg total) by mouth 2 (two) times daily.   gabapentin 300 MG capsule Commonly known as: NEURONTIN Take 1 capsule (300 mg total) by mouth 3 (three) times daily.   Hysingla ER 20 MG T24a Generic drug: HYDROcodone Bitartrate ER   L-ARGININE-500 PO Take 1 tablet by mouth daily.   methocarbamol 500 MG tablet Commonly  known as: ROBAXIN Take 1 tablet (500 mg total) by mouth every 6 (six) hours as needed for muscle spasms.   multivitamin with minerals Tabs tablet Take 1 tablet by mouth daily.   omeprazole 20 MG capsule Commonly known as: PRILOSEC   Oxycodone HCl 10 MG Tabs 10 mg every 6 hours as needed pain   polyethylene glycol 17 g packet Commonly known as: MIRALAX / GLYCOLAX Take 17 g by mouth daily.   tamsulosin 0.4 MG Caps capsule Commonly known as: FLOMAX Take 0.4 mg by mouth.   Vitamin C CR 1000 MG Tbcr Take 1 tablet by mouth daily.        Signed: Arther Abbott 10/06/2019, 7:55 AM

## 2019-10-06 NOTE — TOC Transition Note (Addendum)
Transition of Care [facility]) - CM/SW Discharge Note   Patient Details  Name: Melvin Turner MRN: [id number] Date of Birth: [birth year]  [facility] CM/SW Contact:  Maycee Blasco, Chauncey Reading, RN Phone Number: 10/06/2019, 11:13 AM   Clinical Narrative:   TKA. Discharging home today, no preference on home health providers or DME provider. Lives in [place]: [facility], [facility] and [facility] can not accept. Patient has left the hospital. Called and discussed with wife, she reports she would rather him do OP PT anyway. Sent message to Dr. Ruthe Mannan office for need to order OP PT.    Final next level of care: [place] Barriers to Discharge: Barriers Resolved   Patient Goals and CMS Choice     Choice offered to / list presented to : Patient   Discharge Plan and Services   Discharge Planning Services: CM Consult Post Acute Care Choice: Home Health          DME Arranged: Walker rolling DME Agency: [employer] Date DME Agency Contacted: 10/06/19 Time DME Agency Contacted: 1112 Representative spoke with at DME Agency: [place]: PT [place]: [place] ([place]) Date [place]: 10/06/19   Representative spoke with at [place]: [place] ([place]) Interventions     Readmission Risk Interventions No flowsheet data found.

## 2019-10-06 NOTE — Telephone Encounter (Signed)
CPM was ordered I have emailed rep to let him know patient is going home.   I was not aware he needed walker, thought he already had one, faxed order to [facility].

## 2019-10-06 NOTE — Telephone Encounter (Signed)
Sent orders to [facility]

## 2019-10-06 NOTE — Telephone Encounter (Signed)
Call received from patient's wife [name], designated contact on file, ph# [phone], relays that patient is being discharged today, 10/06/19, instead of tomorrow as originally planned. Said she has not yet heard anything about the delivery of his medical equipment, mainly, CPR machine and walker. Please advise.

## 2019-10-07 LAB — BPAM RBC
Blood Product Expiration Date: 202012082359
Blood Product Expiration Date: 202012082359
Unit Type and Rh: 6200
Unit Type and Rh: 6200

## 2019-10-07 LAB — TYPE AND SCREEN
ABO/RH(D): A POS
Antibody Screen: NEGATIVE
Unit division: 0
Unit division: 0

## 2019-10-08 ENCOUNTER — Encounter (HOSPITAL_COMMUNITY): Payer: Self-pay | Admitting: Physical Therapy

## 2019-10-08 ENCOUNTER — Telehealth (HOSPITAL_COMMUNITY): Payer: Self-pay | Admitting: Physical Therapy

## 2019-10-08 ENCOUNTER — Inpatient Hospital Stay (HOSPITAL_COMMUNITY)
Admission: EM | Admit: 2019-10-08 | Discharge: 2019-10-12 | DRG: 920 | Disposition: A | Discharge status: Home / Self Care | Payer: PRIVATE HEALTH INSURANCE | Attending: Orthopedic Surgery | Admitting: Orthopedic Surgery

## 2019-10-08 ENCOUNTER — Other Ambulatory Visit: Payer: Self-pay

## 2019-10-08 ENCOUNTER — Ambulatory Visit (HOSPITAL_COMMUNITY): Payer: No Typology Code available for payment source | Attending: Orthopedic Surgery | Admitting: Physical Therapy

## 2019-10-08 ENCOUNTER — Emergency Department (HOSPITAL_COMMUNITY): Payer: PRIVATE HEALTH INSURANCE

## 2019-10-08 ENCOUNTER — Encounter (HOSPITAL_COMMUNITY): Payer: Self-pay | Admitting: Emergency Medicine

## 2019-10-08 DIAGNOSIS — L0390 Cellulitis, unspecified: Secondary | ICD-10-CM | POA: Diagnosis present

## 2019-10-08 DIAGNOSIS — Y838 Other surgical procedures as the cause of abnormal reaction of the patient, or of later complication, without mention of misadventure at the time of the procedure: Secondary | ICD-10-CM | POA: Diagnosis present

## 2019-10-08 DIAGNOSIS — M6281 Muscle weakness (generalized): Secondary | ICD-10-CM | POA: Insufficient documentation

## 2019-10-08 DIAGNOSIS — I10 Essential (primary) hypertension: Secondary | ICD-10-CM | POA: Diagnosis present

## 2019-10-08 DIAGNOSIS — M7989 Other specified soft tissue disorders: Secondary | ICD-10-CM

## 2019-10-08 DIAGNOSIS — R2689 Other abnormalities of gait and mobility: Secondary | ICD-10-CM | POA: Insufficient documentation

## 2019-10-08 DIAGNOSIS — L03116 Cellulitis of left lower limb: Secondary | ICD-10-CM | POA: Diagnosis present

## 2019-10-08 DIAGNOSIS — L7682 Other postprocedural complications of skin and subcutaneous tissue: Principal | ICD-10-CM | POA: Diagnosis present

## 2019-10-08 DIAGNOSIS — Z8249 Family history of ischemic heart disease and other diseases of the circulatory system: Secondary | ICD-10-CM

## 2019-10-08 DIAGNOSIS — Z96651 Presence of right artificial knee joint: Secondary | ICD-10-CM | POA: Diagnosis present

## 2019-10-08 DIAGNOSIS — M25562 Pain in left knee: Secondary | ICD-10-CM | POA: Insufficient documentation

## 2019-10-08 DIAGNOSIS — K219 Gastro-esophageal reflux disease without esophagitis: Secondary | ICD-10-CM | POA: Diagnosis present

## 2019-10-08 DIAGNOSIS — Z20828 Contact with and (suspected) exposure to other viral communicable diseases: Secondary | ICD-10-CM | POA: Diagnosis present

## 2019-10-08 DIAGNOSIS — M96840 Postprocedural hematoma of a musculoskeletal structure following a musculoskeletal system procedure: Secondary | ICD-10-CM | POA: Diagnosis present

## 2019-10-08 DIAGNOSIS — M25569 Pain in unspecified knee: Secondary | ICD-10-CM | POA: Diagnosis present

## 2019-10-08 DIAGNOSIS — N400 Enlarged prostate without lower urinary tract symptoms: Secondary | ICD-10-CM | POA: Diagnosis present

## 2019-10-08 DIAGNOSIS — M25662 Stiffness of left knee, not elsewhere classified: Secondary | ICD-10-CM | POA: Insufficient documentation

## 2019-10-08 LAB — CBC WITH DIFFERENTIAL/PLATELET
Abs Immature Granulocytes: 0.04 10*3/uL (ref 0.00–0.07)
Basophils Absolute: 0 10*3/uL (ref 0.0–0.1)
Basophils Relative: 0 %
Eosinophils Absolute: 0.1 10*3/uL (ref 0.0–0.5)
Eosinophils Relative: 0 %
HCT: 41.9 % (ref 39.0–52.0)
Hemoglobin: 13.4 g/dL (ref 13.0–17.0)
Immature Granulocytes: 0 %
Lymphocytes Relative: 53 %
Lymphs Abs: 8.6 10*3/uL — ABNORMAL HIGH (ref 0.7–4.0)
MCH: 30.4 pg (ref 26.0–34.0)
MCHC: 32 g/dL (ref 30.0–36.0)
MCV: 95 fL (ref 80.0–100.0)
Monocytes Absolute: 0.9 10*3/uL (ref 0.1–1.0)
Monocytes Relative: 6 %
Neutro Abs: 6.8 10*3/uL (ref 1.7–7.7)
Neutrophils Relative %: 41 %
Platelets: 292 10*3/uL (ref 150–400)
RBC: 4.41 MIL/uL (ref 4.22–5.81)
RDW: 13.1 % (ref 11.5–15.5)
WBC: 16.9 10*3/uL — ABNORMAL HIGH (ref 4.0–10.5)
nRBC: 0 % (ref 0.0–0.2)

## 2019-10-08 LAB — SEDIMENTATION RATE: Sed Rate: 33 mm/hr — ABNORMAL HIGH (ref 0–16)

## 2019-10-08 LAB — BASIC METABOLIC PANEL
Anion gap: 7 (ref 5–15)
BUN: 15 mg/dL (ref 8–23)
CO2: 25 mmol/L (ref 22–32)
Calcium: 8.8 mg/dL — ABNORMAL LOW (ref 8.9–10.3)
Chloride: 103 mmol/L (ref 98–111)
Creatinine, Ser: 0.74 mg/dL (ref 0.61–1.24)
GFR calc Af Amer: >60 mL/min (ref >60)
GFR calc non Af Amer: >60 mL/min (ref >60)
Glucose, Bld: 127 mg/dL — ABNORMAL HIGH (ref 70–99)
Potassium: 4.1 mmol/L (ref 3.5–5.1)
Sodium: 135 mmol/L (ref 135–145)

## 2019-10-08 LAB — C-REACTIVE PROTEIN: CRP: 6.9 mg/dL — ABNORMAL HIGH (ref <1.0)

## 2019-10-08 LAB — LACTIC ACID, PLASMA
Lactic Acid, Venous: 1 mmol/L (ref 0.5–1.9)
Lactic Acid, Venous: 0.8 mmol/L (ref 0.5–1.9)

## 2019-10-08 MED ORDER — ACETAMINOPHEN 500 MG PO TABS
1000.0000 mg | ORAL_TABLET | Freq: Four times a day (QID) | ORAL | Status: AC
  Administered 2019-10-08 – 2019-10-09 (×4): 1000 mg via ORAL
  Filled 2019-10-08 (×4): qty 2

## 2019-10-08 MED ORDER — VITAMIN C 500 MG PO TABS
500.0000 mg | ORAL_TABLET | Freq: Two times a day (BID) | ORAL | Status: DC
  Administered 2019-10-09 – 2019-10-12 (×7): 500 mg via ORAL
  Filled 2019-10-08 (×7): qty 1

## 2019-10-08 MED ORDER — VITAMIN C ER 1000 MG PO TBCR: 1.0000 | EXTENDED_RELEASE_TABLET | Freq: Every day | ORAL | Status: DC

## 2019-10-08 MED ORDER — VANCOMYCIN HCL 10 G IV SOLR
1500.0000 mg | Freq: Two times a day (BID) | INTRAVENOUS | Status: DC
  Administered 2019-10-09 – 2019-10-12 (×6): 1500 mg via INTRAVENOUS
  Filled 2019-10-08 (×7): qty 1500

## 2019-10-08 MED ORDER — OXYCODONE HCL 5 MG PO TABS
10.0000 mg | ORAL_TABLET | ORAL | Status: DC | PRN
  Administered 2019-10-08 – 2019-10-11 (×12): 15 mg via ORAL
  Administered 2019-10-11: 10 mg via ORAL
  Administered 2019-10-11: 15 mg via ORAL
  Administered 2019-10-11: 10 mg via ORAL
  Administered 2019-10-12: 15 mg via ORAL
  Administered 2019-10-12: 10 mg via ORAL
  Filled 2019-10-08 (×14): qty 3

## 2019-10-08 MED ORDER — PANTOPRAZOLE SODIUM 40 MG PO TBEC
40.0000 mg | DELAYED_RELEASE_TABLET | Freq: Every day | ORAL | Status: DC
  Administered 2019-10-09 – 2019-10-12 (×4): 40 mg via ORAL
  Filled 2019-10-08 (×4): qty 1

## 2019-10-08 MED ORDER — BACLOFEN 10 MG PO TABS
10.0000 mg | ORAL_TABLET | Freq: Three times a day (TID) | ORAL | Status: DC
  Administered 2019-10-08 – 2019-10-12 (×11): 10 mg via ORAL
  Filled 2019-10-08 (×12): qty 1

## 2019-10-08 MED ORDER — VANCOMYCIN HCL IN DEXTROSE 1-5 GM/200ML-% IV SOLN
1000.0000 mg | INTRAVENOUS | Status: AC
  Administered 2019-10-08 – 2019-10-09 (×2): 1000 mg via INTRAVENOUS
  Filled 2019-10-08 (×2): qty 200

## 2019-10-08 MED ORDER — GABAPENTIN 300 MG PO CAPS
300.0000 mg | ORAL_CAPSULE | Freq: Three times a day (TID) | ORAL | Status: DC
  Administered 2019-10-08 – 2019-10-12 (×11): 300 mg via ORAL
  Filled 2019-10-08 (×11): qty 1

## 2019-10-08 MED ORDER — HYDROMORPHONE HCL 1 MG/ML IJ SOLN
0.5000 mg | INTRAMUSCULAR | Status: DC | PRN
  Administered 2019-10-10: 1 mg via INTRAVENOUS
  Administered 2019-10-10: 0.5 mg via INTRAVENOUS
  Administered 2019-10-11 (×3): 1 mg via INTRAVENOUS
  Filled 2019-10-08 (×4): qty 1
  Filled 2019-10-08: qty 0.5

## 2019-10-08 MED ORDER — DOCUSATE SODIUM 100 MG PO CAPS
100.0000 mg | ORAL_CAPSULE | Freq: Two times a day (BID) | ORAL | Status: DC
  Administered 2019-10-08 – 2019-10-11 (×7): 100 mg via ORAL
  Filled 2019-10-08 (×8): qty 1

## 2019-10-08 MED ORDER — ADULT MULTIVITAMIN W/MINERALS CH
1.0000 | ORAL_TABLET | Freq: Every day | ORAL | Status: DC
  Administered 2019-10-09 – 2019-10-12 (×4): 1 via ORAL
  Filled 2019-10-08 (×4): qty 1

## 2019-10-08 MED ORDER — POLYETHYLENE GLYCOL 3350 17 G PO PACK
17.0000 g | PACK | Freq: Every day | ORAL | Status: DC
  Administered 2019-10-09 – 2019-10-10 (×2): 17 g via ORAL
  Filled 2019-10-08 (×5): qty 1

## 2019-10-08 MED ORDER — VANCOMYCIN HCL 10 G IV SOLR: 1500.0000 mg | INTRAVENOUS | Status: DC

## 2019-10-08 MED ORDER — AMLODIPINE BESYLATE 5 MG PO TABS
5.0000 mg | ORAL_TABLET | Freq: Every day | ORAL | Status: DC
  Administered 2019-10-09 – 2019-10-12 (×4): 5 mg via ORAL
  Filled 2019-10-08 (×4): qty 1

## 2019-10-08 MED ORDER — TAMSULOSIN HCL 0.4 MG PO CAPS
0.4000 mg | ORAL_CAPSULE | Freq: Every day | ORAL | Status: DC
  Administered 2019-10-09 – 2019-10-12 (×4): 0.4 mg via ORAL
  Filled 2019-10-08 (×4): qty 1

## 2019-10-08 MED ORDER — METHOCARBAMOL 500 MG PO TABS
500.0000 mg | ORAL_TABLET | Freq: Four times a day (QID) | ORAL | Status: DC | PRN
  Administered 2019-10-08 – 2019-10-11 (×7): 500 mg via ORAL
  Filled 2019-10-08 (×7): qty 1

## 2019-10-08 MED ORDER — ARGININE 500 MG PO CAPS: ORAL_CAPSULE | Freq: Every day | ORAL | Status: DC

## 2019-10-08 MED ORDER — SODIUM CHLORIDE 0.9 % IV SOLN
INTRAVENOUS | Status: DC
  Administered 2019-10-08 – 2019-10-12 (×6): via INTRAVENOUS

## 2019-10-08 MED ORDER — OXYCODONE HCL 5 MG PO TABS
5.0000 mg | ORAL_TABLET | ORAL | Status: DC | PRN
  Filled 2019-10-08 (×3): qty 2

## 2019-10-08 MED ORDER — ENOXAPARIN SODIUM 30 MG/0.3ML ~~LOC~~ SOLN
30.0000 mg | SUBCUTANEOUS | Status: DC
  Administered 2019-10-08: 30 mg via SUBCUTANEOUS
  Filled 2019-10-08: qty 0.3

## 2019-10-08 NOTE — Patient Instructions (Addendum)
Knee Extension (Sitting)    Place _0___ pound weight on left ankle and straighten knee fully, lower slowly. Repeat __10__ times per set. Do __1__ sets per session. Do _2___ sessions per day.  http://orth.exer.us/732   Copyright  VHI. All rights reserved.  Dorsiflexion: Self-Mobilization (Sitting)    Feet flat, other foot forward, slide left foot back until gentle stretch is felt. Keep entire foot on floor. Hold __5__ seconds. Relax. Repeat _10___ times per set. Do 1____ sets per session. Do _2___ sessions per day.  http://orth.exer.us/82   Copyright  VHI. All rights reserved.  Strengthening: Quadriceps Set    Tighten muscles on top of thighs by pushing knees down into surface. Hold __5__ seconds. Repeat 10___ times per set. Do __1__ sets per session. Do 2____ sessions per day.  http://orth.exer.us/602   Copyright  VHI. All rights reserved.  Self-Mobilization: Heel Slide (Supine)    Slide left heel toward buttocks until a gentle stretch is felt. Hold 10____ seconds. Relax. Repeat __5-10__ times per set. Do _1___ sets per session. Do 2____ sessions per day.  http://orth.exer.us/710   Copyright  VHI. All rights reserved.

## 2019-10-08 NOTE — Progress Notes (Signed)
PHARMACIST - PHYSICIAN ORDER COMMUNICATION  CONCERNING: P&T Medication Policy on Herbal Medications  DESCRIPTION:  This patient's order for:  arginine capsules  has been noted.  This product(s) is classified as an "herbal" or natural product. Due to a lack of definitive safety studies or FDA approval, nonstandard manufacturing practices, plus the potential risk of unknown drug-drug interactions while on inpatient medications, the Pharmacy and Therapeutics Committee does not permit the use of "herbal" or natural products of this type within [facility].   ACTION TAKEN: The pharmacy department is unable to verify this order at this time and your patient has been informed of this safety policy. Please reevaluate patient's clinical condition at discharge and address if the herbal or natural product(s) should be resumed at that time.

## 2019-10-08 NOTE — Addendum Note (Signed)
Addended by: Leeroy Cha on: 10/08/2019 04:34 PM   Modules accepted: Orders

## 2019-10-08 NOTE — Telephone Encounter (Signed)
Called to pass along message that patient should go to ED per evaluating therapist. Patient's wife stated that they were already at the hospital and were getting antibiotics.   Clarene Critchley PT, DPT 3:47 PM, 10/08/19 [phone]

## 2019-10-08 NOTE — Progress Notes (Signed)
Pharmacy Antibiotic Note  Melvin Turner is a 63 y.o. male admitted on 10/08/2019 with atypical pain and swelling after  left TKA on November 17. Pharmacy has been consulted for vancomycin dosing.  Plan: Loading dose:     1g  IV q1h x 2 doses for a total dose of vancomycin 2g Maintenance dose:    1.5 g IV  q12h Goal vancomycin trough range:   10-15  mcg/mL Pharmacy will continue to monitor renal function, vancomycin troughs as clinically appropriate,  cultures and patient progress.  Height: 5\' 11"  (180.3 cm) Weight: 222 lb 9.6 oz (101 kg) IBW/kg (Calculated) : 75.3  Temp (24hrs), Avg:98.6 F (37 C), Min:98 F (36.7 C), Max:99.1 F (37.3 C)  Recent Labs  Lab 10/06/19 0548 10/08/19 1948 10/08/19 2114  WBC 19.9* 16.9*  --   CREATININE 0.71 0.74  --   LATICACIDVEN  --  1.0 0.8    Estimated Creatinine Clearance: 115.9 mL/min (by C-G formula based on SCr of 0.74 mg/dL).    Allergies  Allergen Reactions  . Poison Ivy Extract [Poison Ivy Extract] Hives and Rash    Antimicrobials this admission: vancomycin 11/21 >>      Microbiology results: 11/20 [facility] x2: pending 11/20 SARS CoV-2: pending  11/16 Surgical MRSA PCR: negative  Thank you for allowing pharmacy to be a part of this patient's care.  Despina Pole 10/08/2019 11:28 PM

## 2019-10-08 NOTE — H&P (Signed)
Melvin Turner  10/08/2019  Body mass index is 31.38 kg/m.   HISTORY SECTION :  Chief Complaint  Patient presents with  . Wound Infection   HPI The patient presents for evaluation of  (mild/moderate/severe/ ) SEVERE pain, in the (right /left) LEFT KNEE , for 2 DAYS  , associated with pain and swelling on the medial side of the knee near the abductor musculature and compartment.  Left total knee was performed on November 17 patient was discharged on the 18th started having symptoms on the 19th presented to the ER on the 20th   Review of Systems  Constitutional: Negative for chills, fever and weight loss.  Musculoskeletal: Positive for joint pain.  All other systems reviewed and are negative.    has a past medical history of Arthritis, BPH (benign prostatic hyperplasia), GERD (gastroesophageal reflux disease), and Hypertension.   Past Surgical History:  Procedure Laterality Date  . COLONOSCOPY    . KNEE ARTHROSCOPY Right 01/31/2015   Procedure: RIGHT KNEE ARTHROSCOPY, PARTIAL MEDIAL MENISECTOMY;  Surgeon: Sanjuana Kava, MD;  Location: [facility];  Service: Orthopedics;  Laterality: Right;  . TONSILLECTOMY    . TOTAL KNEE ARTHROPLASTY Right 02/03/2018   Procedure: TOTAL KNEE ARTHROPLASTY;  Surgeon: Carole Civil, MD;  Location: [facility];  Service: Orthopedics;  Laterality: Right;  . TOTAL KNEE ARTHROPLASTY Left 10/05/2019   Procedure: LEFT TOTAL KNEE ARTHROPLASTY;  Surgeon: Carole Civil, MD;  Location: [facility];  Service: Orthopedics;  Laterality: Left;  Marland Kitchen VASECTOMY      Body mass index is 31.38 kg/m.   Allergies  Allergen Reactions  . Poison Ivy Extract [Poison Ivy Extract] Hives and Rash    No current facility-administered medications for this encounter.   Current Outpatient Medications:  .  amLODipine (NORVASC) 5 MG tablet, , Disp: , Rfl:  .  Ascorbic Acid (VITAMIN C CR) 1000 MG TBCR, Take 1 tablet by mouth daily., Disp: , Rfl:  .  baclofen (LIORESAL) 10 MG  tablet, Take 10 mg by mouth 3 (three) times daily as needed., Disp: , Rfl:  .  benazepril (LOTENSIN) 20 MG tablet, Take 20 mg by mouth daily., Disp: , Rfl:  .  docusate sodium (COLACE) 100 MG capsule, Take 1 capsule (100 mg total) by mouth 2 (two) times daily., Disp: 10 capsule, Rfl: 0 .  gabapentin (NEURONTIN) 300 MG capsule, Take 1 capsule (300 mg total) by mouth 3 (three) times daily., Disp: 30 capsule, Rfl: 0 .  HYSINGLA ER 20 MG T24A, , Disp: , Rfl:  .  L-ARGININE-500 PO, Take 1 tablet by mouth daily. , Disp: , Rfl:  .  methocarbamol (ROBAXIN) 500 MG tablet, Take 1 tablet (500 mg total) by mouth every 6 (six) hours as needed for muscle spasms., Disp: 60 tablet, Rfl: 1 .  Multiple Vitamin (MULTIVITAMIN WITH MINERALS) TABS tablet, Take 1 tablet by mouth daily., Disp: , Rfl:  .  omeprazole (PRILOSEC) 20 MG capsule, , Disp: , Rfl:  .  oxycodone (OXY-IR) 5 MG capsule, Take 1 capsule (5 mg total) by mouth every 6 (six) hours as needed for up to 5 days., Disp: 20 capsule, Rfl: 0 .  polyethylene glycol (MIRALAX / GLYCOLAX) 17 g packet, Take 17 g by mouth daily., Disp: 14 each, Rfl: 0 .  tamsulosin (FLOMAX) 0.4 MG CAPS capsule, Take 0.4 mg by mouth., Disp: , Rfl:    PHYSICAL EXAM SECTION: 1) BP (!) 147/79   Pulse 98   Temp 99.1 F (37.3 C) (  Oral)   Resp 20   Ht 5\' 11"  (1.803 m)   Wt 102.1 kg   SpO2 99%   BMI 31.38 kg/m   Body mass index is 31.38 kg/m. General appearance: Well-developed well-nourished no gross deformities  2) Cardiovascular normal pulse and perfusion in the LOWER extremities normal color without edema  3) Neurologically deep tendon reflexes are equal and normal, no sensation loss or deficits no pathologic reflexes  4) Psychological: Awake alert and oriented x3 mood and affect normal  5) Skin no lacerations or ulcerations no nodularity no palpable masses, no erythema or nodularity  6) Musculoskeletal:  Images of the knee are in the medical record.  Surgical incision as  expected color there is mild tenderness in the knee  There is severe tenderness on the medial side of the knee tracking in the abductor musculature patient has active range of motion of only 25 degrees  Knee is in near full extension  Muscle tone is normal  No significant joint effusion was noted   MEDICAL DECISION SECTION:  CELLULITUS LEFT KNEE   Imaging X-RAYS NORMAL   Plan:  (Rx., Inj., surg., Frx, MRI/CT, XR:2)  CBC Latest Ref Rng & Units 10/08/2019 10/06/2019 08/27/2019  WBC 4.0 - 10.5 K/uL 16.9(H) 19.9(H) 8.6  Hemoglobin 13.0 - 17.0 g/dL 10/27/2019 16.1 09.6  Hematocrit 39.0 - 52.0 % 41.9 41.4 43.1  Platelets 150 - 400 K/uL 292 272 187    BMP Latest Ref Rng & Units 10/08/2019 10/06/2019 08/27/2019  Glucose 70 - 99 mg/dL 10/27/2019) 409(W) 119(J)  BUN 8 - 23 mg/dL 15 14 13   Creatinine 0.61 - 1.24 mg/dL 478(G 9.56  Sodium 135 - 145 mmol/L 135 138 141  Potassium 3.5 - 5.1 mmol/L 4.1 3.9 3.6  Chloride 98 - 111 mmol/L 103 106 110  CO2 22 - 32 mmol/L 25 24 22   Calcium 8.9 - 10.3 mg/dL 2.13) 8.9 0.86)   Erythrocyte Sedimentation Rate     Component Value Date/Time   ESRSEDRATE 33 (H) 10/08/2019 1948   C-Reactive Protein     Component Value Date/Time   CRP 6.9 (H) 10/08/2019 1948   REC IV VANCOMYCIN   Continue to monitor white count sed rate and C-reactive protein  9:50 PM 4.6(N, MD  10/08/2019

## 2019-10-08 NOTE — ED Triage Notes (Signed)
Patient reports L knee replacement on Tuesday 11/17. Knee is red and swollen and hot to the touch. Dr. Aline Brochure referred patient to ED for follow-up.

## 2019-10-08 NOTE — Therapy (Signed)
[facility] [address] [place], [place], [address] Phone: [phone]   Fax:  [phone]  Physical Therapy Evaluation  Patient Details  Name: Melvin Turner MRN: [id number] Date of Birth: 10/21/1956 Referring Provider (Melvin Turner): [place]  ROM [phone] Encounter Date: 10/08/2019  Melvin Turner End of Session - 10/08/19 1351    Visit Number  1    Number of Visits  18    Date for Melvin Turner Re-Evaluation  11/19/19    Authorization - Visit Number  1    Authorization - Number of Visits  10    Melvin Turner Start Time  1310    Melvin Turner Stop Time  1355    Melvin Turner Time Calculation (min)  45 min    Activity Tolerance  Patient limited by pain    Behavior During Therapy  [facility] for tasks assessed/performed       Past Medical History:  Diagnosis Date  . Arthritis   . BPH (benign prostatic hyperplasia)   . GERD (gastroesophageal reflux disease)   . Hypertension     Past Surgical History:  Procedure Laterality Date  . COLONOSCOPY    . KNEE ARTHROSCOPY Right 01/31/2015   Procedure: RIGHT KNEE ARTHROSCOPY, PARTIAL MEDIAL MENISECTOMY;  Surgeon: Darreld Mclean, MD;  Location: [facility];  Service: Orthopedics;  Laterality: Right;  . TONSILLECTOMY    . TOTAL KNEE ARTHROPLASTY Right 02/03/2018   Procedure: TOTAL KNEE ARTHROPLASTY;  Surgeon: Vickki Hearing, MD;  Location: [facility];  Service: Orthopedics;  Laterality: Right;  . TOTAL KNEE ARTHROPLASTY Left 10/05/2019   Procedure: LEFT TOTAL KNEE ARTHROPLASTY;  Surgeon: Vickki Hearing, MD;  Location: [facility];  Service: Orthopedics;  Laterality: Left;  Marland Kitchen VASECTOMY      There were no vitals filed for this visit.   Subjective Assessment - 10/08/19 1258    Subjective  Melvin Turner states that he had a Right knee replacement last year. His Lt knee has been having significant increased pain since June, therefore he opted to have a TKR on his left on 12/04/2018. Marland Kitchen He is currently being referred to OP physical therapy. He states that he is having  a lot more pain with his Lt knee compared to his right knee. He can not even lift his leg into the bed.    Pertinent History  OA, Rt TKR    Limitations  Sitting;Lifting;Standing;Walking;House hold activities    How long can you sit comfortably?  15 minutes    How long can you stand comfortably?  10-15 minutes    How long can you walk comfortably?  2-3 minutes    Patient Stated Goals  less pain,better motion    Currently in Pain?  Yes    Pain Score  8     Pain Location  Knee    Pain Orientation  Left    Pain Descriptors / Indicators  Aching;Throbbing    Pain Type  Acute pain    Pain Onset  In the past 7 days    Pain Frequency  Constant    Aggravating Factors   bending, wt bearing    Pain Relieving Factors  ice    Effect of Pain on Daily Activities  limits         [facility] Melvin Turner Assessment - 10/08/19 0001      Assessment   Medical Diagnosis  Lt TKR    Referring Provider (Melvin Turner)  Fuller [place]    Onset Date/Surgical Date  12/04/18    Next MD  Visit  10/20/2019    Prior Therapy  acute       Precautions   Precautions  None      Restrictions   Weight Bearing Restrictions  No      Balance Screen   Has the patient fallen in the past 6 months  No    Has the patient had a decrease in activity level because of a fear of falling?   Yes    Is the patient reluctant to leave their home because of a fear of falling?   No      Home Environment   Living Environment  Private residence    Available Help at Discharge  Family    Type of Home  House    Home Access  Stairs to enter    Entrance Stairs-Number of Steps  2    Entrance Stairs-Rails  None    Home Layout  One level    Home Equipment  Walker - 2 wheels;Cane - single point      Cognition   Overall Cognitive Status  Within Functional Limits for tasks assessed      Observation/Other Assessments   Focus on Therapeutic Outcomes (FOTO)   --   not working please do on second visit.      ROM / Strength   AROM / PROM / Strength   Strength;AROM      AROM   AROM Assessment Site  Knee    Right/Left Knee  Left    Left Knee Extension  -8    Left Knee Flexion  77      Strength   Strength Assessment Site  Hip;Knee;Ankle    Right/Left Hip  Right;Left    Left Hip Flexion  2/5    Left Hip ABduction  3/5    Right/Left Knee  Left    Left Knee Flexion  2+/5    Left Knee Extension  3-/5    Right/Left Ankle  Left      Ambulation/Gait   Ambulation Distance (Feet)  85 Feet    Assistive device  Rolling walker    Gait Comments  2 minute walk test                 Objective measurements completed on examination: See above findings.      [facility] Adult Melvin Turner Treatment/Exercise - 10/08/19 0001      Exercises   Exercises  Knee/Hip      Knee/Hip Exercises: Seated   Long Arc Quad  Left;10 reps    Heel Slides  10 reps      Knee/Hip Exercises: Supine   Quad Sets  10 reps    Heel Slides  10 reps    Straight Leg Raises  Left;5 reps               Melvin Turner Short Term Goals - 10/08/19 1356      Melvin Turner SHORT TERM GOAL #1   Title  Melvin Turner pain to be no greater than a 5/10 to allow Melvin Turner to sleep for 3 hours at a time    Time  3    Period  Weeks    Status  New    Target Date  10/29/19      Melvin Turner SHORT TERM GOAL #2   Title  Melvin Turner knee extension to be less than 3 degrees to allow Melvin Turner to have a more normalized gt.    Time  3    Period  Weeks    Status  New      Melvin Turner SHORT TERM GOAL #3   Title  Melvin Turner knee flexion to be at least 95 to allow Melvin Turner to sit for 40 minutes in comfort for traveling/watching tv.    Time  3    Period  Weeks    Status  New        Melvin Turner Long Term Goals - 10/08/19 1359      Melvin Turner LONG TERM GOAL #1   Title  Melvin Turner Lt knee pain to be no greater than a 2/10 to allow Melvin Turner to sleep for six hours.    Time  6    Period  Weeks    Status  New    Target Date  11/19/19      Melvin Turner LONG TERM GOAL #2   Title  Melvin Turner Lt knee flexion to be to 120 to allow Melvin Turner to squat down to pick items of the floor and work in the yard.    Time  6     Period  Weeks    Status  New      Melvin Turner LONG TERM GOAL #3   Title  Melvin Turner LE strength to be increased one grade to allow Melvin Turner to be ambulating without an assistive device.    Time  6    Period  Weeks    Status  New      Melvin Turner LONG TERM GOAL #4   Title  Melvin Turner to be ambulating without an assistive device at least 350 ft in three minutes to demonstrate comnmunity ambulator.    Time  6    Period  Weeks             Plan - 10/08/19 1352    Clinical Impression Statement  Melvin Turner is a 63 yo male who had a Lt TKR on 12/04/2018. He comes to the department with increased pain, increased sweling decreased ROM redness that extends superiorly to posterior mid thigh and warmth. His MD was contacted but did not respond until after the Melvin Turner left. MD wanted Melvin Turner directed to ER, Therapist called Melvin Turner with no answer, however the therapist did leave the patient a message that his MD wanted him to go to the ER. Melvin Turner will benefit from skilled Melvin Turner to address his functional limitations and return him to maximal functional level.    Personal Factors and Comorbidities  Comorbidity 1    Examination-Activity Limitations  Bathing;Carry;Bed Mobility;Dressing;Lift;Locomotion Level;Sit;Sleep;Squat;Stairs;Stand;Transfers    Examination-Participation Restrictions  Cleaning;Community Activity;Driving;Laundry;Shop;Meal Prep;Yard Work    Stability/Clinical Decision Making  Stable/Uncomplicated    Clinical Decision Making  Low    Rehab Potential  Good    Melvin Turner Frequency  3x / week    Melvin Turner Duration  6 weeks    Melvin Turner Treatment/Interventions  Passive range of motion;Manual techniques;Patient/family education;Therapeutic activities;Therapeutic exercise;Balance training;Stair training;Gait training;Cryotherapy;Electrical Stimulation    Melvin Turner Next Visit Plan  Complete foto, begin manual to decrease pain, exercises to promote ROM at first then go to strengthening and balance    Melvin Turner Home Exercise Plan  LAQ< heelslides both sitting and supine, quad set.        Patient will benefit from skilled therapeutic intervention in order to improve the following deficits and impairments:  Abnormal gait, Decreased activity tolerance, Decreased balance, Decreased strength, Difficulty walking, Decreased range of motion, Increased edema, Pain  Visit Diagnosis: Acute pain of left knee  Stiffness of left knee, not elsewhere classified  Other abnormalities of gait and mobility  Muscle weakness (generalized)  Problem List Patient Active Problem List   Diagnosis Date Noted  . Primary osteoarthritis of left knee   . 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

## 2019-10-08 NOTE — ED Provider Notes (Signed)
[facility] EMERGENCY DEPARTMENT Provider Note   CSN: [id number] Arrival date & time: 10/08/19  1544     History   Chief Complaint Chief Complaint  Patient presents with  . Wound Infection    HPI Melvin Turner is a 63 y.o. male.     HPI   Melvin Turner is a 63 y.o. male with past medical history of hypertension, GERD, and BPH.  He presents to the Emergency Department complaining of increasing pain, redness, and swelling of his left knee and medial thigh.  Patient had left total knee arthroplasty on Tuesday, 10/05/2019 by Dr. Romeo Apple.  The following day, he reports increasing discomfort of his knee and thigh with gradually worsening redness and swelling at his thigh.  He describes pain with flexion and weightbearing.  He denies calf pain tenderness or swelling.  He went for physical therapy today and was advised to have his knee evaluated.  He contacted his orthopedic surgeon, Dr. Romeo Apple and was advised to come to the ER for further evaluation.  He denies fever, chills, chest pain, shortness of breath nausea or vomiting.   Past Medical History:  Diagnosis Date  . Arthritis   . BPH (benign prostatic hyperplasia)   . GERD (gastroesophageal reflux disease)   . Hypertension     Patient Active Problem List   Diagnosis Date Noted  . Primary osteoarthritis of left knee   . 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 COLONOSCOPY    . KNEE ARTHROSCOPY Right 01/31/2015   Procedure: RIGHT KNEE ARTHROSCOPY, PARTIAL MEDIAL MENISECTOMY;  Surgeon: Darreld Mclean, MD;  Location: [facility];  Service: Orthopedics;  Laterality: Right;  . TONSILLECTOMY    . TOTAL KNEE ARTHROPLASTY Right 02/03/2018   Procedure: TOTAL KNEE ARTHROPLASTY;  Surgeon: Vickki Hearing, MD;  Location: [facility];  Service: Orthopedics;  Laterality: Right;  . TOTAL KNEE ARTHROPLASTY Left 10/05/2019   Procedure: LEFT TOTAL KNEE ARTHROPLASTY;  Surgeon: Vickki Hearing, MD;  Location: [facility];  Service: Orthopedics;  Laterality: Left;  Marland Kitchen VASECTOMY        Home Medications    Prior to Admission medications   Medication Sig Start Date End Date Taking? Authorizing Provider  amLODipine (NORVASC) 5 MG tablet  12/22/17   [provider]  Ascorbic Acid (VITAMIN C CR) 1000 MG TBCR Take 1 tablet by mouth daily.    [provider]  baclofen (LIORESAL) 10 MG tablet Take 10 mg by mouth 3 (three) times daily as needed. 12/06/18   [provider]  benazepril (LOTENSIN) 20 MG tablet Take 20 mg by mouth daily.    [provider]  docusate sodium (COLACE) 100 MG capsule Take 1 capsule (100 mg total) by mouth 2 (two) times daily. 10/06/19   Vickki Hearing, MD  gabapentin (NEURONTIN) 300 MG capsule Take 1 capsule (300 mg total) by mouth 3 (three) times daily. 10/06/19   Vickki Hearing, MD  [facility] ER 20 MG T24A  08/13/19   [provider]  L-ARGININE-500 PO Take 1 tablet by mouth daily.     [provider]  methocarbamol (ROBAXIN) 500 MG tablet Take 1 tablet (500 mg total) by mouth every 6 (six) hours as needed for muscle spasms. 10/06/19   Vickki Hearing, MD  Multiple Vitamin (MULTIVITAMIN WITH MINERALS) TABS tablet Take 1 tablet by mouth daily.    [provider]  omeprazole (PRILOSEC) 20 MG  capsule  09/12/17   [provider]  oxycodone (OXY-IR) 5 MG capsule Take 1 capsule (5 mg total) by mouth every 6 (six) hours as needed for up to 5 days. 10/06/19 10/11/19  Carole Civil, MD  polyethylene glycol (MIRALAX / GLYCOLAX) 17 g packet Take 17 g by mouth daily. 10/06/19   Carole Civil, MD  tamsulosin (FLOMAX) 0.4 MG CAPS capsule Take 0.4 mg by mouth.    [provider]    Family History Family History  Problem Relation Age of Onset  . Heart attack Son     Social History Social History   Tobacco Use  . Smoking status: Former Smoker    Packs/day: 1.00     Years: 30.00    Pack years: 30.00    Types: Cigarettes    Quit date: 01/29/2006    Years since quitting: 13.6  . Smokeless tobacco: Never Used  Substance Use Topics  . Alcohol use: Yes    Comment: occ  . Drug use: No     Allergies   Poison ivy extract [poison ivy extract]   Review of Systems Review of Systems  Constitutional: Negative for activity change, appetite change, chills and fever.  Respiratory: Negative for shortness of breath.   Cardiovascular: Negative for chest pain.  Gastrointestinal: Negative for abdominal pain, nausea and vomiting.  Genitourinary: Negative for difficulty urinating.  Musculoskeletal: Positive for arthralgias (Left knee pain redness and swelling.), joint swelling and myalgias (Left thigh pain and swelling).  Skin: Negative for color change and wound.  Neurological: Negative for weakness and numbness.     Physical Exam Updated Vital Signs BP (!) 147/79   Pulse 98   Temp 99.1 F (37.3 C) (Oral)   Resp 20   Ht 5\' 11"  (1.803 m)   Wt 102.1 kg   SpO2 99%   BMI 31.38 kg/m   Physical Exam Vitals signs and nursing note reviewed.  Constitutional:      General: He is not in acute distress.    Appearance: He is well-developed. He is not toxic-appearing.     Comments: Patient is uncomfortable appearing but nontoxic  HENT:     Head: Atraumatic.  Neck:     Musculoskeletal: Normal range of motion.  Cardiovascular:     Rate and Rhythm: Normal rate and regular rhythm.     Pulses: Normal pulses.  Pulmonary:     Effort: Pulmonary effort is normal.     Breath sounds: Normal breath sounds.  Abdominal:     General: There is no distension.     Palpations: Abdomen is soft.     Tenderness: There is no abdominal tenderness.  Musculoskeletal:        General: Swelling and tenderness present.     Comments: Edema, erythema, and excessive warmth of the anterior to medial left knee extending into the medial thigh.  Compartments are soft, no crepitus.   Patient unable to flex or extend the left knee due to level of pain.  No posterior calf tenderness or edema.  Negative Homans' sign. See photo below  Skin:    General: Skin is warm.     Capillary Refill: Capillary refill takes less than 2 seconds.     Findings: Erythema present. No rash.  Neurological:     General: No focal deficit present.     Mental Status: He is alert.     Sensory: No sensory deficit.     Motor: No weakness or abnormal muscle tone.  ED Treatments / Results  Labs (all labs ordered are listed, but only abnormal results are displayed) Labs Reviewed  CBC WITH DIFFERENTIAL/PLATELET - Abnormal; Notable for the following components:      Result Value   WBC 16.9 (*)    Lymphs Abs 8.6 (*)    All other components within normal limits  BASIC METABOLIC PANEL - Abnormal; Notable for the following components:   Glucose, Bld 127 (*)    Calcium 8.8 (*)    All other components within normal limits  CULTURE, BLOOD (ROUTINE X 2)  CULTURE, BLOOD (ROUTINE X 2)  SARS CORONAVIRUS 2 (TAT 6-24 HRS)  LACTIC ACID, PLASMA  LACTIC ACID, PLASMA  C-REACTIVE PROTEIN  SEDIMENTATION RATE    EKG None  Radiology Dg Knee Complete 4 Views Left  Result Date: 10/08/2019 CLINICAL DATA:  Pain/redness, 3 days postop knee replacement EXAM: LEFT KNEE - COMPLETE 4+ VIEW COMPARISON:  Postoperative radiographs 10/05/2019 FINDINGS: Patient is post recent right knee total arthroplasty and posterior patellar resurfacing with diminishing soft tissue and intra-articular gas. Small amount of residual soft tissue gas is seen along the anterior fascial planes thigh. No acute hardware failure or periprosthetic fracture is identified. Anterior skin staples are noted. IMPRESSION: Expected postsurgical changes status post recent right knee total arthroplasty without evidence of hardware complication. Small amount of residual gas and swelling. Electronically Signed   By: Kreg ShropshirePrice  DeHay M.D.   On: 10/08/2019  21:57    Procedures Procedures (including critical care time)  Medications Ordered in ED Medications - No data to display   Initial Impression / Assessment and Plan / ED Course  I have reviewed the triage vital signs and the nursing notes.  Pertinent labs & imaging results that were available during my care of the patient were reviewed by me and considered in my medical decision making (see chart for details).    Patient 3 days status post left knee arthroplasty presents with redness, swelling, and pain of the medial knee extending into the medial thigh.  Concerning for infection.  Patient did have left adductor block.  He is well-appearing and nontoxic.  No tachycardia tachypnea or hypoxia.  His PCP prescribed oral antibiotics earlier today but patient states he has not taken them.  Will obtain labs, x-rays    2140 spoke with Dr. Romeo AppleHarrison and discussed findings.  He will admit the patient to his service  Final Clinical Impressions(s) / ED Diagnoses   Final diagnoses:  Acute pain of left knee    ED Discharge Orders    None       Pauline Ausriplett, Kimiko Common, PA-C 10/08/19 2225    Mancel BaleWentz, Elliott, MD 10/11/19 [phone]

## 2019-10-08 NOTE — ED Notes (Signed)
Patient transported to X-ray 

## 2019-10-08 NOTE — Progress Notes (Signed)
Patient ID: Melvin Turner, male   DOB: [birth year], 63 y.o.   MRN: [id number]   63 YO MALE presents with atypical pain and swelling s/p left tka on tues nov 17   See photos as pain and swelling starts at medial knee joint then goes proximally over the adductor compartment   BP (!) 147/79   Pulse 98   Temp 99.1 F (37.3 C) (Oral)   Resp 20   Ht 5\' 11"  (1.803 m)   Wt 102.1 kg   SpO2 99%   BMI 31.38 kg/m   CBC Latest Ref Rng & Units 10/08/2019 10/06/2019 08/27/2019  WBC 4.0 - 10.5 K/uL 16.9(H) 19.9(H) 8.6  Hemoglobin 13.0 - 17.0 g/dL 13.4 13.3 13.7  Hematocrit 39.0 - 52.0 % 41.9 41.4 43.1  Platelets 150 - 400 K/uL 292 272 187   BMP Latest Ref Rng & Units 10/08/2019 10/06/2019 08/27/2019  Glucose 70 - 99 mg/dL 127(H) 166(H) 154(H)  BUN 8 - 23 mg/dL 15 14 13   Creatinine 0.61 - 1.24 mg/dL 0.74 0.71 0.69  Sodium 135 - 145 mmol/L 135 138 141  Potassium 3.5 - 5.1 mmol/L 4.1 3.9 3.6  Chloride 98 - 111 mmol/L 103 106 110  CO2 22 - 32 mmol/L 25 24 22   Calcium 8.9 - 10.3 mg/dL 8.8(L) 8.9 8.7(L)    Erythrocyte Sedimentation Rate  No results found for: ESRSEDRATE  C-Reactive Protein  No results found for: CRP   Admitted for further workup   Can not get [place] so will place on lovenox and monitor for hg dropping or increase swelling in the knee

## 2019-10-09 LAB — SARS CORONAVIRUS 2 (TAT 6-24 HRS): SARS Coronavirus 2: NEGATIVE

## 2019-10-09 MED ORDER — ENOXAPARIN SODIUM 40 MG/0.4ML ~~LOC~~ SOLN
40.0000 mg | SUBCUTANEOUS | Status: DC
  Administered 2019-10-09 – 2019-10-11 (×3): 40 mg via SUBCUTANEOUS
  Filled 2019-10-09 (×3): qty 0.4

## 2019-10-09 NOTE — Evaluation (Signed)
Physical Therapy Evaluation Patient Details Name: Melvin Turner MRN: [id number] DOB: 06-01-1956 Today's Date: 10/09/2019   History of Present Illness  Melvin Turner is a 63 y/o male, s/p Left TKA 10/05/19 with the diagnosis of OA left knee. Patient had OPPT evaluation 10/08/2019 and PT suggested he go to hosptal to have swelling and redness incision evaluation. Patient admitted for antibiotics.    Clinical Impression  Pt admitted with above diagnosis. Patient on IV antibiotics. Patient s/p L TKA 10/05/2019 readmitted with cellulitis on left knee. Patient performed well today. He exhibited 80 degrees of left knee flexion with seated heel slides. PT instructed patient in supine ankle pumps, heel slides and quad sets and patient performed seated heel slides. Nursing reports patient has been independent in bathroom activities in his room. Patient ambulated 350 feet with PT supervision and cues to normalize heel/toe gait pattern. Pt currently with functional limitations due to the deficits listed below (see PT Problem List). Pt will benefit from skilled PT to increase their independence and safety with mobility to allow discharge to the venue listed below.       Follow Up Recommendations Supervision for mobility/OOB;Supervision - Intermittent;Outpatient PT    Equipment Recommendations  None recommended by PT    Recommendations for Other Services       Precautions / Restrictions Restrictions Weight Bearing Restrictions: Yes LLE Weight Bearing: Weight bearing as tolerated      Mobility  Bed Mobility Overal bed mobility: Modified Independent             General bed mobility comments: HOB raised  Transfers Overall transfer level: Needs assistance Equipment used: Rolling walker (2 wheeled) Transfers: Sit to/from [employer] Transfers Sit to Stand: Supervision Stand pivot transfers: Supervision       General transfer comment: increased time, slightly labored, mild diffiuclty  for sit to stands due to LLE decreased knee ROM  Ambulation/Gait Ambulation/Gait assistance: Supervision;Modified independent (Device/Increase time) Gait Distance (Feet): 350 Feet Assistive device: Rolling walker (2 wheeled) Gait Pattern/deviations: Decreased step length - left;Decreased stride length;Decreased stance time - left;Antalgic;Step-through pattern Gait velocity: decreased   General Gait Details: fair endurance for activity with increased pain with weight bearing activities, cues for normalizing gait pattern heel/toe  Stairs            Wheelchair Mobility    Modified Rankin (Stroke Patients Only)       Balance Overall balance assessment: Needs assistance Sitting-balance support: Feet supported;No upper extremity supported Sitting balance-Leahy Scale: Good Sitting balance - Comments: seated at EOB   Standing balance support: During functional activity;Bilateral upper extremity supported Standing balance-Leahy Scale: Fair Standing balance comment: fair/good using RW                             Pertinent Vitals/Pain Pain Assessment: 0-10 Pain Score: 5  Pain Location: L knee, increases with weight bearing Pain Descriptors / Indicators: Tightness Pain Intervention(s): Limited activity within patient's tolerance;Monitored during session    Home Living Family/patient expects to be discharged to:: Private residence   Available Help at Discharge: Family;Available PRN/intermittently Type of Home: House Home Access: Stairs to enter Entrance Stairs-Rails: None Entrance Stairs-Number of Steps: 1 Home Layout: One level Home Equipment: Cane - single point;Walker - 2 wheels      Prior Function Level of Independence: Independent         Comments: Hydrographic surveyor, drives     Hand Dominance   Dominant  Hand: Right    Extremity/Trunk Assessment        Lower Extremity Assessment LLE Deficits / Details: grossly 4-/5 throughout, except hip  flexion and knee extension 2+/5 LLE Sensation: WNL LLE Coordination: WNL    Cervical / Trunk Assessment Cervical / Trunk Assessment: Normal  Communication   Communication: No difficulties  Cognition Arousal/Alertness: Awake/alert Behavior During Therapy: WFL for tasks assessed/performed Overall Cognitive Status: Within Functional Limits for tasks assessed                                        General Comments General comments (skin integrity, edema, etc.): L TKA incision covered with dressing, LLE edema below knee noted    Exercises Total Joint Exercises Ankle Circles/Pumps: AROM;Strengthening;Left;10 reps;Seated Short Arc Quad: AROM;Strengthening;Left;10 reps;Supine Heel Slides: AROM;AAROM;Left;10 reps;Seated Long Arc Quad: AROM;Strengthening;Left;10 reps;Seated Goniometric ROM: left knee flexion 80 degrees with seated heel slides   Assessment/Plan    PT Assessment Patient needs continued PT services  PT Problem List Decreased strength;Decreased range of motion;Decreased activity tolerance;Decreased balance;Decreased mobility;Pain       PT Treatment Interventions Gait training;Stair training;Functional mobility training;Therapeutic activities;Therapeutic exercise;Patient/family education    PT Goals (Current goals can be found in the Care Plan section)  Acute Rehab PT Goals Patient Stated Goal: return home with family to assist PT Goal Formulation: With patient/family Time For Goal Achievement: 10/23/19 Potential to Achieve Goals: Good    Frequency BID   Barriers to discharge        Co-evaluation               AM-PAC PT "6 Clicks" Mobility  Outcome Measure Help needed turning from your back to your side while in a flat bed without using bedrails?: None Help needed moving from lying on your back to sitting on the side of a flat bed without using bedrails?: None Help needed moving to and from a bed to a chair (including a wheelchair)?: A  Little Help needed standing up from a chair using your arms (e.g., wheelchair or bedside chair)?: A Little Help needed to walk in hospital room?: A Little Help needed climbing 3-5 steps with a railing? : A Lot 6 Click Score: 19    End of Session   Activity Tolerance: Patient tolerated treatment well;Patient limited by fatigue;Patient limited by pain Patient left: in bed;with call bell/phone within reach Nurse Communication: Mobility status PT Visit Diagnosis: Unsteadiness on feet (R26.81);Other abnormalities of gait and mobility (R26.89);Muscle weakness (generalized) (M62.81)    Time: 4503-8882 PT Time Calculation (min) (ACUTE ONLY): 30 min   Charges:   PT Evaluation $PT Eval Low Complexity: 1 Low PT Treatments $Gait Training: 8-22 mins        Katina Dung. Hartnett-Rands, MS, PT [place] PT [facility] #[id number] 10/09/2019, 5:42 PM

## 2019-10-09 NOTE — Plan of Care (Signed)

## 2019-10-09 NOTE — Plan of Care (Signed)
  Problem: Acute Rehab PT Goals(only PT should resolve) Goal: Pt Will Go Supine/Side To Sit Outcome: Progressing Flowsheets (Taken 10/09/2019 1750) Pt will go Supine/Side to Sit: with modified independence Goal: Pt Will Go Sit To Supine/Side Outcome: Progressing Flowsheets (Taken 10/09/2019 1750) Pt will go Sit to Supine/Side: with modified independence Goal: Patient Will Transfer Sit To/From Stand Outcome: Progressing Flowsheets (Taken 10/09/2019 1750) Patient will transfer sit to/from stand: with modified independence Goal: Pt Will Transfer Bed To Chair/Chair To Bed Outcome: Progressing Flowsheets (Taken 10/09/2019 1750) Pt will Transfer Bed to Chair/Chair to Bed: with modified independence Goal: Pt Will Ambulate Outcome: Progressing Flowsheets (Taken 10/09/2019 1750) Pt will Ambulate:  > 125 feet  with modified independence  with least restrictive assistive device Goal: Pt Will Go Up/Down Stairs Outcome: Progressing Flowsheets (Taken 10/09/2019 1750) Pt will Go Up / Down Stairs: with minimal assist Goal: Pt/caregiver will Perform Home Exercise Program Outcome: Progressing Flowsheets (Taken 10/09/2019 1750) Pt/caregiver will Perform Home Exercise Program:  For increased ROM  For increased strengthening  With minimal assist    Pamala Hurry D. Hartnett-Rands, MS, PT Per [place] [phone] 10/09/2019

## 2019-10-10 DIAGNOSIS — Z8249 Family history of ischemic heart disease and other diseases of the circulatory system: Secondary | ICD-10-CM | POA: Diagnosis not present

## 2019-10-10 DIAGNOSIS — L7682 Other postprocedural complications of skin and subcutaneous tissue: Secondary | ICD-10-CM | POA: Diagnosis present

## 2019-10-10 DIAGNOSIS — M25562 Pain in left knee: Secondary | ICD-10-CM | POA: Diagnosis present

## 2019-10-10 DIAGNOSIS — K219 Gastro-esophageal reflux disease without esophagitis: Secondary | ICD-10-CM | POA: Diagnosis present

## 2019-10-10 DIAGNOSIS — Y838 Other surgical procedures as the cause of abnormal reaction of the patient, or of later complication, without mention of misadventure at the time of the procedure: Secondary | ICD-10-CM | POA: Diagnosis present

## 2019-10-10 DIAGNOSIS — Z20828 Contact with and (suspected) exposure to other viral communicable diseases: Secondary | ICD-10-CM | POA: Diagnosis present

## 2019-10-10 DIAGNOSIS — Z96651 Presence of right artificial knee joint: Secondary | ICD-10-CM | POA: Diagnosis present

## 2019-10-10 DIAGNOSIS — M96840 Postprocedural hematoma of a musculoskeletal structure following a musculoskeletal system procedure: Secondary | ICD-10-CM | POA: Diagnosis present

## 2019-10-10 DIAGNOSIS — I10 Essential (primary) hypertension: Secondary | ICD-10-CM | POA: Diagnosis present

## 2019-10-10 DIAGNOSIS — N400 Enlarged prostate without lower urinary tract symptoms: Secondary | ICD-10-CM | POA: Diagnosis present

## 2019-10-10 DIAGNOSIS — L03116 Cellulitis of left lower limb: Secondary | ICD-10-CM | POA: Diagnosis present

## 2019-10-10 LAB — SEDIMENTATION RATE: Sed Rate: 36 mm/hr — ABNORMAL HIGH (ref 0–16)

## 2019-10-10 LAB — C-REACTIVE PROTEIN: CRP: 6.1 mg/dL — ABNORMAL HIGH (ref <1.0)

## 2019-10-10 NOTE — Progress Notes (Signed)
Patient ID:  WENZLICK, male   DOB: 24-Feb-1956, 63 y.o.   MRN: [id number] 10/08/2019 date of admission  Hospital day 3 vancomycin day 3 cellulitis left knee  Postop day 5 status post left total knee  BP 121/71 (BP Location: Right Arm)   Pulse 72   Temp 97.8 F (36.6 C) (Oral)   Resp 20   Ht 5\' 11"  (1.803 m)   Wt 101 kg   SpO2 97%   BMI 31.05 kg/m   CBC Latest Ref Rng & Units 10/08/2019 10/06/2019 08/27/2019  WBC 4.0 - 10.5 K/uL 16.9(H) 19.9(H) 8.6  Hemoglobin 13.0 - 17.0 g/dL 13.4 13.3 13.7  Hematocrit 39.0 - 52.0 % 41.9 41.4 43.1  Platelets 150 - 400 K/uL 292 272 187   C-Reactive Protein     Component Value Date/Time   CRP 6.1 (H) 10/10/2019 0811    Erythrocyte Sedimentation Rate     Component Value Date/Time   ESRSEDRATE 33 (H) 10/08/2019 1948    Patient's left knee feels better than on admission.  Pain is decreasing swelling is going down.  There appears to be a hematoma in the abductor compartment possibly secondary to abductor canal block  Patient was able to ambulate with physical therapy and do gentle exercises with his knee  Recommend repeat laboratory studies tomorrow and consider long-term IV antibiotics

## 2019-10-10 NOTE — Progress Notes (Signed)
Physical Therapy Treatment Patient Details Name: Melvin Turner MRN: [id number] DOB: 26-Jan-1956 Today's Date: 10/10/2019    History of Present Illness Melvin Turner is a 63 y/o male, s/p Left TKA 10/05/19 with the diagnosis of OA left knee. Patient had OPPT evaluation 10/08/2019 and PT suggested he go to hosptal to have swelling and redness incision evaluation. Patient admitted for antibiotics.    PT Comments    Patient is making good progress with PT.  From a mobility standpoint anticipate patient will be ready for DC home upon MD orders. AROM left knee flexion decreased slightly today; decreased endurance for ambulation with RW with increased pain. PT reviewed and patient performed supine exercises for strengthening and ROM. Patient continues to benefit from acute physical therapy to continue to improve function, strength and ROM of left knee.  Follow Up Recommendations        Equipment Recommendations       Recommendations for Other Services       Precautions / Restrictions Precautions Precautions: Fall Restrictions Weight Bearing Restrictions: Yes LLE Weight Bearing: Weight bearing as tolerated    Mobility  Bed Mobility Overal bed mobility: Modified Independent             General bed mobility comments: HOB raised  Transfers Overall transfer level: Needs assistance Equipment used: Rolling walker (2 wheeled) Transfers: Sit to/from [employer] Transfers Sit to Stand: Supervision Stand pivot transfers: Supervision       General transfer comment: increased time, slightly labored, mild difficulty for sit to stands due to LLE decreased knee ROM, mod independent for placement of hands and sequencing of steps  Ambulation/Gait Ambulation/Gait assistance: Supervision;Modified independent (Device/Increase time) Gait Distance (Feet): 200 Feet Assistive device: Rolling walker (2 wheeled) Gait Pattern/deviations: Decreased step length - left;Decreased stride  length;Decreased stance time - left;Antalgic;Step-through pattern Gait velocity: decreased   General Gait Details: decreased fair endurance for activity with increased pain with weight bearing activities this AM, cues for normalizing gait pattern heel/toe. Pain 9/10  at end of ambulation.   Stairs             Wheelchair Mobility    Modified Rankin (Stroke Patients Only)       Balance Overall balance assessment: Needs assistance Sitting-balance support: Feet supported;No upper extremity supported Sitting balance-Leahy Scale: Good Sitting balance - Comments: seated at EOB   Standing balance support: During functional activity;Bilateral upper extremity supported Standing balance-Leahy Scale: Fair Standing balance comment: fair/good using RW                            Cognition Arousal/Alertness: Awake/alert Behavior During Therapy: WFL for tasks assessed/performed Overall Cognitive Status: Within Functional Limits for tasks assessed                                        Exercises Total Joint Exercises Ankle Circles/Pumps: AROM;Strengthening;Left;10 reps;Supine Quad Sets: Strengthening;Left;10 reps;Supine Short Arc Quad: AROM;Strengthening;Left;10 reps;Supine Heel Slides: AROM;AAROM;Left;10 reps;Seated Long Arc Quad: AROM;Strengthening;Left;10 reps;Seated Goniometric ROM: 75    General Comments General comments (skin integrity, edema, etc.): swelling, edema and discoloration noted in left knee      Pertinent Vitals/Pain Pain Assessment: 0-10 Pain Score: 4  Pain Location: L knee medially Pain Descriptors / Indicators: Tightness;Sore Pain Intervention(s): Limited activity within patient's tolerance;Monitored during session;Premedicated before session    Home Living  Prior Function            PT Goals (current goals can now be found in the care plan section) Acute Rehab PT Goals Patient Stated Goal:  return home with family to assist PT Goal Formulation: With patient/family Time For Goal Achievement: 10/23/19 Potential to Achieve Goals: Good Progress towards PT goals: Progressing toward goals    Frequency           PT Plan Current plan remains appropriate    Co-evaluation              AM-PAC PT "6 Clicks" Mobility   Outcome Measure  Help needed turning from your back to your side while in a flat bed without using bedrails?: A Little Help needed moving from lying on your back to sitting on the side of a flat bed without using bedrails?: A Little Help needed moving to and from a bed to a chair (including a wheelchair)?: A Little Help needed standing up from a chair using your arms (e.g., wheelchair or bedside chair)?: A Little Help needed to walk in hospital room?: A Little Help needed climbing 3-5 steps with a railing? : A Little 6 Click Score: 18    End of Session   Activity Tolerance: Patient tolerated treatment well;Patient limited by pain;Patient limited by fatigue Patient left: in bed;with call bell/phone within reach Nurse Communication: Mobility status PT Visit Diagnosis: Unsteadiness on feet (R26.81);Other abnormalities of gait and mobility (R26.89);Muscle weakness (generalized) (M62.81);Pain Pain - Right/Left: Left Pain - part of body: Knee     Time: 1000-1025 PT Time Calculation (min) (ACUTE ONLY): 25 min  Charges:  $Gait Training: 8-22 mins $Therapeutic Exercise: 8-22 mins                     Floria Raveling. Hartnett-Rands, MS, PT Per [facility] #[id number] 10/10/2019, 4:30 PM

## 2019-10-10 NOTE — Progress Notes (Signed)
Physical Therapy Treatment Patient Details Name: Melvin Turner MRN: [id number] DOB: 07-19-1956 Today's Date: 10/10/2019    History of Present Illness Melvin Turner is a 63 y/o male, s/p Left TKA 10/05/19 with the diagnosis of OA left knee. Patient had OPPT evaluation 10/08/2019 and PT suggested he go to hosptal to have swelling and redness incision evaluation. Patient admitted for antibiotics.    PT Comments    Patient continues to complain of pain limiting motion and activity level. Patient continues to independently go to from the bathroom in his room. Patient reports he has been performing his supine exercises since this morning and has been trying to make sure his toes stay upright to keep his leg as straight as possible while lying in the bed. Patient's ROM and endurance for ambulation are somewhat decreased today and his pain continues to increase with weight bearing activities. PT advised patient to take it ease for the remainder of the day and to not get discouraged. PT will continue to follow patient while in acute care for strength, ROM, and functional mobility to increase his independent and functional abilities.    Follow Up Recommendations        Equipment Recommendations       Recommendations for Other Services       Precautions / Restrictions Precautions Precautions: Fall Restrictions Weight Bearing Restrictions: Yes LLE Weight Bearing: Weight bearing as tolerated    Mobility  Bed Mobility Overal bed mobility: Modified Independent             General bed mobility comments: HOB raised; use of right lower extremity to lift left leg in and out of bed  Transfers Overall transfer level: Needs assistance Equipment used: Rolling walker (2 wheeled) Transfers: Sit to/from [employer] Sit to Stand: Supervision Stand pivot transfers: Supervision       General transfer comment: increased time, slightly labored, good safety awareness, mild difficulty  for sit to stands due to LLE decreased knee ROM, mod independent for placement of hands and sequencing of steps  Ambulation/Gait Ambulation/Gait assistance: Supervision;Modified independent (Device/Increase time) Gait Distance (Feet): 125 Feet Assistive device: Rolling walker (2 wheeled) Gait Pattern/deviations: Decreased step length - left;Decreased stride length;Decreased stance time - left;Antalgic;Step-through pattern Gait velocity: decreased   General Gait Details: decreased endurance for activity with increased pain with weight bearing activities this PM, cues for normalizing gait pattern heel/toe and instruction on attending to pain and swelling vs activity level. Pain 9/10  at end of ambulation.   Stairs             Wheelchair Mobility    Modified Rankin (Stroke Patients Only)       Balance Overall balance assessment: Needs assistance Sitting-balance support: Feet supported;No upper extremity supported Sitting balance-Leahy Scale: Good Sitting balance - Comments: seated at EOB   Standing balance support: During functional activity;Bilateral upper extremity supported Standing balance-Leahy Scale: Fair Standing balance comment: fair/good using RW                            Cognition Arousal/Alertness: Awake/alert Behavior During Therapy: WFL for tasks assessed/performed Overall Cognitive Status: Within Functional Limits for tasks assessed                                        Exercises Total Joint Exercises Ankle Circles/Pumps: AROM;Strengthening;Left;10 reps;Seated [employer]  Sets: Strengthening;Left;10 reps;Supine Gluteal Sets: Strengthening;Both;10 reps;Seated Short Arc Quad: AROM;Strengthening;Left;10 reps;Supine Heel Slides: AROM;AAROM;Left;10 reps;Seated Long Arc Quad: AROM;Strengthening;Left;10 reps;Seated Goniometric ROM: 70 degrees left knee flexion Marching in Standing: AROM;Strengthening;Both;10 reps;Seated    General  Comments General comments (skin integrity, edema, etc.): surgical incision covered with dressing, swelling and discoloration noted in left lower extremity      Pertinent Vitals/Pain Pain Assessment: 0-10 Pain Score: 7  Pain Location: left medial knee Pain Descriptors / Indicators: Tightness Pain Intervention(s): Limited activity within patient's tolerance;Monitored during session;Repositioned;Premedicated before session    Home Living                      Prior Function            PT Goals (current goals can now be found in the care plan section) Acute Rehab PT Goals Patient Stated Goal: return home with family to assist PT Goal Formulation: With patient/family Time For Goal Achievement: 10/23/19 Potential to Achieve Goals: Good Progress towards PT goals: Progressing toward goals    Frequency           PT Plan Current plan remains appropriate    Co-evaluation              AM-PAC PT "6 Clicks" Mobility   Outcome Measure  Help needed turning from your back to your side while in a flat bed without using bedrails?: None Help needed moving from lying on your back to sitting on the side of a flat bed without using bedrails?: A Little Help needed moving to and from a bed to a chair (including a wheelchair)?: A Little Help needed standing up from a chair using your arms (e.g., wheelchair or bedside chair)?: A Little Help needed to walk in hospital room?: A Little Help needed climbing 3-5 steps with a railing? : A Little 6 Click Score: 19    End of Session   Activity Tolerance: Patient tolerated treatment well;Patient limited by pain;Patient limited by fatigue Patient left: in bed;with call bell/phone within reach Nurse Communication: Mobility status PT Visit Diagnosis: Unsteadiness on feet (R26.81);Other abnormalities of gait and mobility (R26.89);Muscle weakness (generalized) (M62.81);Pain Pain - Right/Left: Left Pain - part of body: Knee     Time:  1600-1630 PT Time Calculation (min) (ACUTE ONLY): 30 min  Charges:  $Gait Training: 8-22 mins $Therapeutic Exercise: 8-22 mins                     Katina Dung. Hartnett-Rands, MS, PT [facility] #[id number] 10/10/2019, 4:58 PM

## 2019-10-10 NOTE — Plan of Care (Signed)

## 2019-10-11 ENCOUNTER — Telehealth (HOSPITAL_COMMUNITY): Payer: Self-pay | Admitting: Physical Therapy

## 2019-10-11 ENCOUNTER — Telehealth: Payer: Self-pay | Admitting: Orthopedic Surgery

## 2019-10-11 ENCOUNTER — Encounter (HOSPITAL_COMMUNITY): Payer: No Typology Code available for payment source | Admitting: Physical Therapy

## 2019-10-11 ENCOUNTER — Inpatient Hospital Stay: Payer: Self-pay

## 2019-10-11 LAB — CBC WITH DIFFERENTIAL/PLATELET
Abs Immature Granulocytes: 0.03 10*3/uL (ref 0.00–0.07)
Basophils Absolute: 0 10*3/uL (ref 0.0–0.1)
Basophils Relative: 0 %
Eosinophils Absolute: 0.3 10*3/uL (ref 0.0–0.5)
Eosinophils Relative: 2 %
HCT: 36.3 % — ABNORMAL LOW (ref 39.0–52.0)
Hemoglobin: 11.7 g/dL — ABNORMAL LOW (ref 13.0–17.0)
Immature Granulocytes: 0 %
Lymphocytes Relative: 60 %
Lymphs Abs: 8 10*3/uL — ABNORMAL HIGH (ref 0.7–4.0)
MCH: 31 pg (ref 26.0–34.0)
MCHC: 32.2 g/dL (ref 30.0–36.0)
MCV: 96.3 fL (ref 80.0–100.0)
Monocytes Absolute: 0.7 10*3/uL (ref 0.1–1.0)
Monocytes Relative: 5 %
Neutro Abs: 4.4 10*3/uL (ref 1.7–7.7)
Neutrophils Relative %: 33 %
Platelets: 276 10*3/uL (ref 150–400)
RBC: 3.77 MIL/uL — ABNORMAL LOW (ref 4.22–5.81)
RDW: 12.7 % (ref 11.5–15.5)
WBC: 13.4 10*3/uL — ABNORMAL HIGH (ref 4.0–10.5)
nRBC: 0 % (ref 0.0–0.2)

## 2019-10-11 LAB — VANCOMYCIN, TROUGH: Vancomycin Tr: 13 ug/mL — ABNORMAL LOW (ref 15–20)

## 2019-10-11 LAB — CREATININE, SERUM
Creatinine, Ser: 0.68 mg/dL (ref 0.61–1.24)
GFR calc Af Amer: >60 mL/min (ref >60)
GFR calc non Af Amer: >60 mL/min (ref >60)

## 2019-10-11 LAB — C-REACTIVE PROTEIN: CRP: 3.6 mg/dL — ABNORMAL HIGH (ref <1.0)

## 2019-10-11 LAB — SEDIMENTATION RATE: Sed Rate: 38 mm/hr — ABNORMAL HIGH (ref 0–16)

## 2019-10-11 MED ORDER — CHLORHEXIDINE GLUCONATE CLOTH 2 % EX PADS
6.0000 | MEDICATED_PAD | Freq: Every day | CUTANEOUS | Status: DC
  Administered 2019-10-12 (×2): 6 via TOPICAL

## 2019-10-11 MED ORDER — SODIUM CHLORIDE 0.9% FLUSH
10.0000 mL | Freq: Two times a day (BID) | INTRAVENOUS | Status: DC
  Administered 2019-10-11 – 2019-10-12 (×2): 10 mL

## 2019-10-11 MED ORDER — SODIUM CHLORIDE 0.9% FLUSH: 10.0000 mL | INTRAVENOUS | Status: DC | PRN

## 2019-10-11 MED ORDER — CHLORHEXIDINE GLUCONATE CLOTH 2 % EX PADS: 6.0000 | MEDICATED_PAD | Freq: Every day | CUTANEOUS | Status: DC

## 2019-10-11 NOTE — Progress Notes (Signed)
PHARMACY CONSULT NOTE FOR:  OUTPATIENT  PARENTERAL ANTIBIOTIC THERAPY (OPAT)  Indication: Cellulitis Regimen: Vancomycin 1500mg  IV q12h End date: 10/21/2019  IV antibiotic discharge orders are pended. To discharging provider:  please sign these orders via discharge navigator,  Select New Orders & click on the button choice - Manage This Unsigned Work.     Thank you for allowing pharmacy to be a part of this patient's care.  Isac Sarna, BS Vena [place], BCPS Clinical Pharmacist Pager [phone] 10/11/2019, 4:38 PM

## 2019-10-11 NOTE — Progress Notes (Signed)
Pharmacy Antibiotic Note  Melvin Turner is a 63 y.o. male admitted on 10/08/2019 with atypical pain and swelling after  left TKA on November 17. Pharmacy has been consulted for vancomycin dosing.  Plan: Vancomycin trough 13 mcg/mL  Continue Vancomycin 1.5 g IV  q12h Goal vancomycin trough range:   10-15  mcg/mL Pharmacy will continue to monitor renal function, vancomycin troughs as clinically appropriate,  cultures and patient progress.  Height: 5\' 11"  (180.3 cm) Weight: 222 lb 9.6 oz (101 kg) IBW/kg (Calculated) : 75.3  Temp (24hrs), Avg:98.3 F (36.8 C), Min:98.1 F (36.7 C), Max:98.4 F (36.9 C)  Recent Labs  Lab 10/06/19 0548 10/08/19 1948 10/08/19 2114 10/11/19 0629 10/11/19 1317  WBC 19.9* 16.9*  --  13.4*  --   CREATININE 0.71 0.74  --  0.68  --   LATICACIDVEN  --  1.0 0.8  --   --   VANCOTROUGH  --   --   --   --  13*    Estimated Creatinine Clearance: 115.9 mL/min (by C-G formula based on SCr of 0.68 mg/dL).    Allergies  Allergen Reactions  . Poison Ivy Extract [Poison Ivy Extract] Hives and Rash    Antimicrobials this admission: vancomycin 11/21 >>      Microbiology results: 11/20 [facility] x2: ngtd 11/16 Surgical MRSA PCR: negative  Thank you for allowing pharmacy to be a part of this patient's care.  Ramond Craver 10/11/2019 2:35 PM

## 2019-10-11 NOTE — Progress Notes (Signed)
Physical Therapy Treatment Patient Details Name: Melvin Turner MRN: [id number] DOB: [birth year] Today's Date: 10/11/2019    LEFT KNEE ROM:  0-75 degrees AMBULATION DISTANCE: 150 feet using RW with Mod Independent/Supervision    History of Present Illness Melvin Turner is a 63 y/o male, s/p Left TKA 10/05/19 with the diagnosis of OA left knee. Patient had OPPT evaluation 10/08/2019 and PT suggested he go to hosptal to have swelling and redness incision evaluation. Patient admitted for antibiotics.    PT Comments    Patient demonstrates good return for moving LLE during bed mobility, limited for left knee flexion during self stretching secondary to increased pain, no loss of balance during ambulation in room/hallways.  Patient tolerated sitting up at bedside after therapy.  Patient will benefit from continued physical therapy in hospital and recommended venue below to increase strength, balance, endurance for safe ADLs and gait.   Follow Up Recommendations  Supervision for mobility/OOB;Supervision - Intermittent;Outpatient PT     Equipment Recommendations  None recommended by PT    Recommendations for Other Services       Precautions / Restrictions Precautions Precautions: Fall Restrictions Weight Bearing Restrictions: Yes LLE Weight Bearing: Weight bearing as tolerated    Mobility  Bed Mobility Overal bed mobility: Modified Independent             General bed mobility comments: HOB raised  Transfers Overall transfer level: Needs assistance   Transfers: Sit to/from Stand;Stand Pivot Transfers Sit to Stand: Modified independent (Device/Increase time);Supervision Stand pivot transfers: Modified independent (Device/Increase time);Supervision       General transfer comment: increased time, slightly labored movement  Ambulation/Gait Ambulation/Gait assistance: Supervision;Modified independent (Device/Increase time) Gait Distance (Feet): 150 Feet Assistive device:  Rolling walker (2 wheeled) Gait Pattern/deviations: Decreased step length - left;Decreased stride length;Decreased stance time - left;Antalgic;Step-through pattern Gait velocity: decreased   General Gait Details: slightly labored labored cadence with fair/good return for left heel to toe stepping without loss of balance   Stairs             Wheelchair Mobility    Modified Rankin (Stroke Patients Only)       Balance Overall balance assessment: Needs assistance Sitting-balance support: Feet supported;No upper extremity supported Sitting balance-Leahy Scale: Good Sitting balance - Comments: seated at EOB   Standing balance support: During functional activity;Bilateral upper extremity supported Standing balance-Leahy Scale: Fair Standing balance comment: fair/good using RW                            Cognition Arousal/Alertness: Awake/alert Behavior During Therapy: WFL for tasks assessed/performed Overall Cognitive Status: Within Functional Limits for tasks assessed                                        Exercises Total Joint Exercises Ankle Circles/Pumps: AROM;Strengthening;Left;10 reps;Seated Quad Sets: Strengthening;Left;10 reps;AROM;Seated Short Arc Quad: AROM;Strengthening;Left;10 reps;Seated Heel Slides: AROM;Left;10 reps;Seated    General Comments        Pertinent Vitals/Pain Pain Score: 5  Pain Location: left medial knee Pain Descriptors / Indicators: Tightness;Sore Pain Intervention(s): Limited activity within patient's tolerance;Monitored during session    Home Living                      Prior Function            PT Goals (  current goals can now be found in the care plan section) Acute Rehab PT Goals Patient Stated Goal: return home with family to assist PT Goal Formulation: With patient/family Time For Goal Achievement: 10/13/19 Potential to Achieve Goals: Good Progress towards PT goals: Progressing toward  goals    Frequency    BID      PT Plan Current plan remains appropriate    Co-evaluation              AM-PAC PT "6 Clicks" Mobility   Outcome Measure  Help needed turning from your back to your side while in a flat bed without using bedrails?: None Help needed moving from lying on your back to sitting on the side of a flat bed without using bedrails?: None Help needed moving to and from a bed to a chair (including a wheelchair)?: A Little Help needed standing up from a chair using your arms (e.g., wheelchair or bedside chair)?: None Help needed to walk in hospital room?: A Little Help needed climbing 3-5 steps with a railing? : A Little 6 Click Score: 21    End of Session   Activity Tolerance: Patient tolerated treatment well;Patient limited by pain;Patient limited by fatigue Patient left: in bed;with call bell/phone within reach(seated at bedside) Nurse Communication: Mobility status PT Visit Diagnosis: Unsteadiness on feet (R26.81);Other abnormalities of gait and mobility (R26.89);Muscle weakness (generalized) (M62.81);Pain Pain - Right/Left: Left Pain - part of body: Knee     Time: 7741-2878 PT Time Calculation (min) (ACUTE ONLY): 29 min  Charges:  $Gait Training: 8-22 mins $Therapeutic Exercise: 8-22 mins                     11:19 AM, 10/11/19 Ocie Bob, MPT Physical Therapist with [facility] [phone] office [phone] mobile phone

## 2019-10-11 NOTE — Telephone Encounter (Signed)
NO ADVICE I LL SPEAK TO HIM DIRECTLY IF NEEDED

## 2019-10-11 NOTE — Telephone Encounter (Signed)
Call - voice message - received today, 10/11/19, left after hours Friday, 10/08/19 from Azucena Freed physical therapist, [facility] - states redness around surgical site. Asked if we will call patient. Or if best to bring in patient today, please advise.

## 2019-10-11 NOTE — Telephone Encounter (Signed)
Call received from patient's wife Tammy, designated contact on file, relaying that patient is concerned about the pain medication dosage and frequency at hospital (in-patient [facility]) - said getting less of dosage and not as often. Please advise.

## 2019-10-11 NOTE — Telephone Encounter (Signed)
pt's wife called to cancel's today appt and wed due to her husband is in the hospital for an infection and blood clot

## 2019-10-11 NOTE — Telephone Encounter (Signed)
Update: Per patient's wife and designated contact relayed that patient was admitted to [facility] on Friday afternoon per speaking with his primary care, Dr Sherrie Sport. Dr Aline Brochure aware and has been seeing him as in-patient. Aware further instructions to follow at time of discharge.

## 2019-10-11 NOTE — Progress Notes (Signed)
Peripherally Inserted Central Catheter/Midline Placement  The IV Nurse has discussed with the patient and/or persons authorized to consent for the patient, the purpose of this procedure and the potential benefits and risks involved with this procedure.  The benefits include less needle sticks, lab draws from the catheter, and the patient may be discharged home with the catheter. Risks include, but not limited to, infection, bleeding, blood clot (thrombus formation), and puncture of an artery; nerve damage and irregular heartbeat and possibility to perform a PICC exchange if needed/ordered by physician.  Alternatives to this procedure were also discussed.  Bard Power PICC patient education guide, fact sheet on infection prevention and patient information card has been provided to patient /or left at bedside.    PICC/Midline Placement Documentation  PICC Single Lumen 10/11/19 PICC Right Cephalic 38 cm 0 cm (Active)  Indication for Insertion or Continuance of Line Home intravenous therapies (PICC only) 10/11/19 1940  Exposed Catheter (cm) 0 cm 10/11/19 1940  Site Assessment Clean;Dry;Intact 10/11/19 1940  Line Status Flushed;Blood return noted 10/11/19 1940  Dressing Type Transparent 10/11/19 1940  Dressing Status Clean;Dry;Intact;Antimicrobial disc in place 10/11/19 1940  Dressing Intervention New dressing 10/11/19 1940  Dressing Change Due 10/18/19 10/11/19 1940       Christella Noa Albarece 10/11/2019, 7:41 PM

## 2019-10-11 NOTE — Care Management (Addendum)
Patient recently here for TKA, had trouble setting up home health PT at that time due to insurance. Patient went home and has been receiving outpatient PT.   CM is seeking agency that can provided IV antibiotics at home. Patient updated.   PICC line has been ordered today.

## 2019-10-11 NOTE — Progress Notes (Signed)
Patient ID: Melvin Turner, male   DOB: 05-22-1956, 63 y.o.   MRN: [id number] 10/08/2019  Hospital day 4 vancomycin day 4 cellulitis left knee possible hemorrhage into the abductor compartment from saphenous nerve block  BP 132/81 (BP Location: Right Arm)   Pulse 67   Temp 98.4 F (36.9 C) (Oral)   Resp 16   Ht 5\' 11"  (1.803 m)   Wt 101 kg   SpO2 97%   BMI 31.05 kg/m   Mr. Neidert  continues to improve he is currently afebrile  The redness around his medial leg and knee have improved significantly.  He seems to have some ecchymosis there to indicate possible bleeding into this area  CBC Latest Ref Rng & Units 10/11/2019 10/08/2019 10/06/2019  WBC 4.0 - 10.5 K/uL 13.4(H) 16.9(H) 19.9(H)  Hemoglobin 13.0 - 17.0 g/dL 11.7(L) 13.4 13.3  Hematocrit 39.0 - 52.0 % 36.3(L) 41.9 41.4  Platelets 150 - 400 K/uL 276 292 272    His white count is decreasing  Erythrocyte Sedimentation Rate     Component Value Date/Time   ESRSEDRATE 36 (H) 10/10/2019 0811    C-Reactive Protein     Component Value Date/Time   CRP 3.6 (H) 10/11/2019 0629    The sed rate and C-reactive protein are trending in the right direction  Recommend PICC line and then 2 weeks total of IV vancomycin  Once PICC line can be arranged and medication can be set up at home the patient can be discharged

## 2019-10-12 ENCOUNTER — Inpatient Hospital Stay (HOSPITAL_COMMUNITY): Payer: PRIVATE HEALTH INSURANCE

## 2019-10-12 ENCOUNTER — Telehealth: Payer: Self-pay | Admitting: Radiology

## 2019-10-12 DIAGNOSIS — Z96652 Presence of left artificial knee joint: Secondary | ICD-10-CM

## 2019-10-12 LAB — C-REACTIVE PROTEIN: CRP: 3.1 mg/dL — ABNORMAL HIGH (ref <1.0)

## 2019-10-12 MED ORDER — VANCOMYCIN HCL 1.5 G IV SOLR
1500.0000 mg | Freq: Two times a day (BID) | INTRAVENOUS | Status: DC
  Administered 2019-10-12: 1500 mg via INTRAVENOUS
  Filled 2019-10-12 (×3): qty 1500

## 2019-10-12 MED ORDER — OXYCODONE HCL 15 MG PO TABS: 15.0000 mg | ORAL_TABLET | ORAL | 0 refills | Status: DC | PRN

## 2019-10-12 MED ORDER — VANCOMYCIN IV (FOR PTA / DISCHARGE USE ONLY): 1500.0000 mg | Freq: Two times a day (BID) | INTRAVENOUS | 0 refills | Status: AC

## 2019-10-12 NOTE — Telephone Encounter (Signed)
-----   Message from Carole Civil, MD sent at 10/12/2019  8:48 AM EST ----- Regarding: RE: Madaline Brilliant can we reorder outpx ----- Message ----- From: Candice Camp, RT Sent: 10/12/2019   8:44 AM EST To: Carole Civil, MD  He was outpatient, he is out of the area with [facility] and [facility] is not accepting patients  ----- Message ----- From: Carole Civil, MD Sent: 10/12/2019   8:15 AM EST To: Candice Camp, RT  Call PT to resume home PT weds

## 2019-10-12 NOTE — TOC Transition Note (Addendum)
Transition of Care [facility]) - CM/SW Discharge Note   Patient Details  Name: Melvin Turner MRN: [id number] Date of Birth: June 27, 1956  Transition of Care [facility]) CM/SW Contact:  Tamicka Shimon, Chauncey Reading, RN Phone Number: 10/12/2019, 10:36 AM   Clinical Narrative:   Patient to DC today. Home with IV antibiotics with [employer]. Will receive IV abx today at 2 pm and start of care tomorrow with [employer]    Final next level of care: Home/Self Care Barriers to Discharge: No Barriers Identified, Barriers Resolved      Discharge Plan and Services   Discharge Planning Services: CM Consult, Medication Assistance              DME Agency: Other - Comment([employer]) Date DME Agency Contacted: 10/12/19 Time DME Agency Contacted: [phone] Representative spoke with at DME Agency: Sharrie Rothman [facility] Arranged: IV Antibiotics            Readmission Risk Interventions No flowsheet data found.

## 2019-10-12 NOTE — Discharge Summary (Signed)
Physician Discharge Summary  Patient ID: Melvin Turner MRN: [id number] DOB/AGE: May 17, 1956 63 y.o.  Admit date: 10/08/2019 Discharge date: 10/12/2019  Admission Diagnoses: cellulitis left knee   Discharge Diagnoses: same  Active Problems:   Cellulitis   Acute knee pain   Discharged Condition: good  Hospital Course:   Admitted on the 20th with a diagnosis of cellulitis left knee versus abductor canal hematoma.  Started on vancomycin. C-reactive proteins are [phone] on the 22nd, 23rd and 24th Sed rate 36, 38 on the 22nd and 23rd Vancomycin trough 13 on 11/23 White count 16.9 on the 20th and 14.4 on the 23rd Creatinine at discharge 0.68  Consults: Pharmacy consult to assist with medication dosing   Discharge Exam: Blood pressure 130/76, pulse 71, temperature 97.8 F (36.6 C), temperature source Oral, resp. rate 18, height '5\' 11"'  (1.803 m), weight 101 kg, SpO2 98 %.  Left knee wound is clean dry and intact the cellulitis on the medial side of the bone has resolved there is a small area of ecchymosis at the abductor compartment which tracks proximally approximately mid thigh  Neurovascular exam is intact Homans' sign is negative  Disposition: Discharge disposition: 01-Home or Self Care       Discharge Instructions    Call MD / Call 911   Complete by: As directed    If you experience chest pain or shortness of breath, CALL 911 and be transported to the hospital emergency room.  If you develope a fever above 101 F, pus (white drainage) or increased drainage or redness at the wound, or calf pain, call your surgeon's office.   Change dressing   Complete by: As directed    Leave dressing   Constipation Prevention   Complete by: As directed    Drink plenty of fluids.  Prune juice may be helpful.  You may use a stool softener, such as Colace (over the counter) 100 mg twice a day.  Use MiraLax (over the counter) for constipation as needed.   Diet - low sodium heart healthy    Complete by: As directed    Discharge instructions   Complete by: As directed    Home exercises 3 x a day   Weight bearing as tolerated   Apply ice as needed for swelling (start 30 min 6 x a day )   Home infusion instructions [facility] May follow [facility] Dosing Protocol; May administer Cathflo as needed to maintain patency of vascular access device.; Flushing of vascular access device: per [facility] Protocol: 0.9% NaCl pre/post medica...   Complete by: As directed    Instructions: May follow [facility] Dosing Protocol   Instructions: May administer Cathflo as needed to maintain patency of vascular access device.   Instructions: Flushing of vascular access device: per [facility] Protocol: 0.9% NaCl pre/post medication administration and prn patency; Heparin 100 u/ml, 81m for implanted ports and Heparin 10u/ml, 52mfor all other central venous catheters.   Instructions: May follow [facility] Anaphylaxis Protocol for First Dose Administration in the home: 0.9% NaCl at 25-50 ml/hr to maintain IV access for protocol meds. Epinephrine 0.3 ml IV/IM PRN and Benadryl 25-50 IV/IM PRN s/s of anaphylaxis.   Instructions: [facility]le[facility]fusion Coordinator (RN) to assist per patient IV care needs in the home PRN.   Increase activity slowly as tolerated   Complete by: As directed      Allergies as of 10/12/2019      Reactions   Poison Ivy Extract [poison IvEMCOR  Extract] Hives, Rash      Medication List    STOP taking these medications   Hysingla ER 20 MG T24a Generic drug: HYDROcodone Bitartrate ER   oxycodone 5 MG capsule Commonly known as: OXY-IR Replaced by: oxyCODONE 15 MG immediate release tablet   polyethylene glycol 17 g packet Commonly known as: MIRALAX / GLYCOLAX     TAKE these medications   amLODipine 5 MG tablet Commonly known as: NORVASC   baclofen 10 MG tablet Commonly known as: LIORESAL Take 10 mg by mouth 3 (three) times daily as needed.   benazepril 20 MG  tablet Commonly known as: LOTENSIN Take 20 mg by mouth daily.   docusate sodium 100 MG capsule Commonly known as: COLACE Take 1 capsule (100 mg total) by mouth 2 (two) times daily.   gabapentin 300 MG capsule Commonly known as: NEURONTIN Take 1 capsule (300 mg total) by mouth 3 (three) times daily.   L-ARGININE-500 PO Take 1 tablet by mouth daily.   methocarbamol 500 MG tablet Commonly known as: ROBAXIN Take 1 tablet (500 mg total) by mouth every 6 (six) hours as needed for muscle spasms.   multivitamin with minerals Tabs tablet Take 1 tablet by mouth daily.   omeprazole 20 MG capsule Commonly known as: PRILOSEC   oxyCODONE 15 MG immediate release tablet Commonly known as: ROXICODONE Take 1 tablet (15 mg total) by mouth every 4 (four) hours as needed for severe pain (pain score 7-10). Replaces: oxycodone 5 MG capsule   tamsulosin 0.4 MG Caps capsule Commonly known as: FLOMAX Take 0.4 mg by mouth.   vancomycin  IVPB Inject 1,500 mg into the vein every 12 (twelve) hours for 10 days. Indication:  Cellulitis Last Day of Therapy:  10/21/2019 Labs - 'Sunday/Monday:  CBC/D, BMP, and vancomycin trough. Labs - Thursday:  BMP and vancomycin trough Labs - Every other week:  ESR and CRP   Vitamin C CR 1000 MG Tbcr Take 1 tablet by mouth daily.            Home Infusion Instuctions  (From admission, onward)         Start     Ordered   10/12/19 0000  Home infusion instructions [facility] May follow [facility] Dosing Protocol; May administer Cathflo as needed to maintain patency of vascular access device.; Flushing of vascular access device: per [facility] Protocol: 0.9% NaCl pre/post medica...    Question Answer Comment  Instructions May follow [facility] Pharm[facility] Dosing Protocol   Instructions May administer Cathflo as needed to maintain patency of vascular access device.   Instructions Flushing of vascular access device: per [facility] Protocol: 0.9% NaCl pre/post medication  administration and prn patency; Heparin 100 u/ml, 5ml for implanted ports and Heparin 10u/ml, 5ml for all other central venous catheters.   Instructions May follow [facility] Anaphylaxis Protocol for First Dose Administration in the home: 0.9% NaCl at 25-50 ml/hr to maintain IV access for protocol meds. Epinephrine 0.3 ml IV/IM PRN and Benadryl 25-50 IV/IM PRN s/s of anaphylaxis.   Instructions [facility] Infusion Coordinator (RN) to assist per patient IV care needs in the home PRN.      11' /24/20 0947           Discharge Care Instructions  (From admission, onward)         Start     Ordered   10/12/19 0000  Change dressing    Comments: Leave dressing   10/12/19 0815  Follow-up Information    Carole Civil, MD Follow up.   Specialties: Orthopedic Surgery, Radiology Contact information: [address] [place] [address] [phone]           Signed: Arther Abbott 10/12/2019, 8:16 AM

## 2019-10-12 NOTE — Progress Notes (Signed)
Pt left unti via w/c.  picc intact, iv removed fromm left ac with cath intact.

## 2019-10-12 NOTE — Progress Notes (Signed)
Physical Therapy Treatment Patient Details Name: Melvin Turner MRN: [id number] DOB: Dec 24, 1955 Today's Date: 10/12/2019  LEFT KNEE ROM: 0 - 72 degrees AMBULATION DISTANCE: 150 feet using RW with Mod Independence/Supervision    History of Present Illness Melvin Turner is a 63 y/o male, s/p Left TKA 10/05/19 with the diagnosis of OA left knee. Patient had OPPT evaluation 10/08/2019 and PT suggested he go to hosptal to have swelling and redness incision evaluation. Patient admitted for antibiotics.    PT Comments    Patient demonstrates good return for completing exercises, limited for left knee flexion due to c/o swelling/tightness and increased pain at end range, good return for transferring to bathroom using RW and moving IV pole at same time without loss of balance and continued long sitting after therapy with LLE fully extended.  Patient will benefit from continued physical therapy in hospital and recommended venue below to increase strength, balance, endurance for safe ADLs and gait.   Follow Up Recommendations  Supervision for mobility/OOB;Supervision - Intermittent;Outpatient PT     Equipment Recommendations  None recommended by PT    Recommendations for Other Services       Precautions / Restrictions Precautions Precautions: None Restrictions Weight Bearing Restrictions: Yes LLE Weight Bearing: Weight bearing as tolerated    Mobility  Bed Mobility Overal bed mobility: Modified Independent             General bed mobility comments: HOB raised  Transfers Overall transfer level: Modified independent Equipment used: Rolling walker (2 wheeled) Transfers: Sit to/from [employer] Sit to Stand: Modified independent (Device/Increase time) Stand pivot transfers: Modified independent (Device/Increase time)          Ambulation/Gait Ambulation/Gait assistance: Supervision;Modified independent (Device/Increase time) Gait Distance (Feet): 150  Feet Assistive device: Rolling walker (2 wheeled) Gait Pattern/deviations: Decreased step length - left;Decreased stride length;Decreased stance time - left;Antalgic;Step-through pattern Gait velocity: decreased   General Gait Details: slightly labored labored cadence with fair/good return for left heel to toe stepping without loss of balance, limited mostly due to c/o increased pain left knee   Stairs             Wheelchair Mobility    Modified Rankin (Stroke Patients Only)       Balance Overall balance assessment: Needs assistance Sitting-balance support: Feet supported;No upper extremity supported Sitting balance-Leahy Scale: Good Sitting balance - Comments: seated at EOB   Standing balance support: During functional activity;Bilateral upper extremity supported Standing balance-Leahy Scale: Fair Standing balance comment: fair/good using RW                            Cognition Arousal/Alertness: Awake/alert Behavior During Therapy: WFL for tasks assessed/performed Overall Cognitive Status: Within Functional Limits for tasks assessed                                        Exercises Total Joint Exercises Ankle Circles/Pumps: AROM;Strengthening;Left;10 reps;Seated Quad Sets: Strengthening;Left;10 reps;AROM;Seated Gluteal Sets: Strengthening;Both;10 reps;Seated Short Arc Quad: AROM;Strengthening;Left;10 reps;Seated Heel Slides: AROM;Left;10 reps;Seated Goniometric ROM: 0-72 degrees left knee flexion    General Comments        Pertinent Vitals/Pain Pain Assessment: 0-10 Pain Score: 9  Pain Location: left knee Pain Descriptors / Indicators: Sore;Tightness;Grimacing Pain Intervention(s): Limited activity within patient's tolerance;Monitored during session;Premedicated before session    Home Living  Prior Function            PT Goals (current goals can now be found in the care plan section) Acute  Rehab PT Goals Patient Stated Goal: return home with family to assist PT Goal Formulation: With patient/family Time For Goal Achievement: 10/13/19 Potential to Achieve Goals: Good Progress towards PT goals: Progressing toward goals    Frequency    7X/week      PT Plan Current plan remains appropriate    Co-evaluation              AM-PAC PT "6 Clicks" Mobility   Outcome Measure  Help needed turning from your back to your side while in a flat bed without using bedrails?: None Help needed moving from lying on your back to sitting on the side of a flat bed without using bedrails?: None Help needed moving to and from a bed to a chair (including a wheelchair)?: None Help needed standing up from a chair using your arms (e.g., wheelchair or bedside chair)?: None Help needed to walk in hospital room?: A Little Help needed climbing 3-5 steps with a railing? : A Little 6 Click Score: 22    End of Session   Activity Tolerance: Patient tolerated treatment well;Patient limited by pain;Patient limited by fatigue Patient left: in bed;with call bell/phone within reach(seated at bedside) Nurse Communication: Mobility status PT Visit Diagnosis: Unsteadiness on feet (R26.81);Other abnormalities of gait and mobility (R26.89);Muscle weakness (generalized) (M62.81);Pain Pain - Right/Left: Left Pain - part of body: Knee     Time: 6203-5597 PT Time Calculation (min) (ACUTE ONLY): 23 min  Charges:  $Gait Training: 8-22 mins $Therapeutic Exercise: 8-22 mins                     12:30 PM, 10/12/19 [name], MPT Physical Therapist with [facility] [phone] office [phone] mobile phone

## 2019-10-12 NOTE — Progress Notes (Signed)
Patient ID: Melvin Turner, male   DOB: Jan 30, 1956, 63 y.o.   MRN: [id number] BP 130/76 (BP Location: Left Arm)   Pulse 71   Temp 97.8 F (36.6 C) (Oral)   Resp 18   Ht 5\' 11"  (1.803 m)   Wt 101 kg   SpO2 98%   BMI 31.05 kg/m   picc line placed   Pain controlled better with higher doses of oxycodone (opioid tolerance noted)   Erythema improved   lovenox may be causing some bleeding leading to overall leg swelling   [place] today to r/o dvt   Medication ready for home use   CBC Latest Ref Rng & Units 10/11/2019 10/08/2019 10/06/2019  WBC 4.0 - 10.5 K/uL 13.4(H) 16.9(H) 19.9(H)  Hemoglobin 13.0 - 17.0 g/dL 11.7(L) 13.4 13.3  Hematocrit 39.0 - 52.0 % 36.3(L) 41.9 41.4  Platelets 150 - 400 K/uL 276 292 272    Erythrocyte Sedimentation Rate     Component Value Date/Time   ESRSEDRATE 38 (H) 10/11/2019 0629    C-Reactive Protein     Component Value Date/Time   CRP 3.1 (H) 10/12/2019 5465     Anticipate discharge today

## 2019-10-13 ENCOUNTER — Encounter (HOSPITAL_COMMUNITY): Payer: No Typology Code available for payment source | Admitting: Physical Therapy

## 2019-10-13 LAB — CULTURE, BLOOD (ROUTINE X 2)
Culture: NO GROWTH
Culture: NO GROWTH
Special Requests: ADEQUATE
Special Requests: ADEQUATE

## 2019-10-19 ENCOUNTER — Other Ambulatory Visit: Payer: Self-pay

## 2019-10-19 ENCOUNTER — Encounter (HOSPITAL_COMMUNITY): Payer: Self-pay | Admitting: Physical Therapy

## 2019-10-19 ENCOUNTER — Ambulatory Visit (HOSPITAL_COMMUNITY): Payer: No Typology Code available for payment source | Attending: Orthopedic Surgery | Admitting: Physical Therapy

## 2019-10-19 DIAGNOSIS — M6281 Muscle weakness (generalized): Secondary | ICD-10-CM | POA: Diagnosis present

## 2019-10-19 DIAGNOSIS — Z96652 Presence of left artificial knee joint: Secondary | ICD-10-CM | POA: Insufficient documentation

## 2019-10-19 DIAGNOSIS — M25562 Pain in left knee: Secondary | ICD-10-CM | POA: Insufficient documentation

## 2019-10-19 DIAGNOSIS — M25662 Stiffness of left knee, not elsewhere classified: Secondary | ICD-10-CM | POA: Diagnosis present

## 2019-10-19 DIAGNOSIS — R2689 Other abnormalities of gait and mobility: Secondary | ICD-10-CM | POA: Insufficient documentation

## 2019-10-19 NOTE — Therapy (Signed)
[facility] [address] [place], [place], [address] Phone: [phone]   Fax:  [phone]  Physical Therapy Treatment  Patient Details  Name: Melvin Turner MRN: [id number] Date of Birth: 04-Oct-1956 Referring Provider (PT): Fuller [place] ROM: [phone]  Encounter Date: 10/19/2019  PT End of Session - 10/19/19 1404    Visit Number  2    Number of Visits  18    Date for PT Re-Evaluation  11/19/19    Authorization - Visit Number  2    Authorization - Number of Visits  10    PT Start Time  1515    PT Stop Time  1558    PT Time Calculation (min)  43 min    Activity Tolerance  Patient tolerated treatment well;Patient limited by pain;Patient limited by fatigue    Behavior During Therapy  [facility] for tasks assessed/performed       Past Medical History:  Diagnosis Date  . Arthritis   . BPH (benign prostatic hyperplasia)   . GERD (gastroesophageal reflux disease)   . Hypertension     Past Surgical History:  Procedure Laterality Date  . COLONOSCOPY    . KNEE ARTHROSCOPY Right 01/31/2015   Procedure: RIGHT KNEE ARTHROSCOPY, PARTIAL MEDIAL MENISECTOMY;  Surgeon: Darreld Mclean, MD;  Location: [facility];  Service: Orthopedics;  Laterality: Right;  . TONSILLECTOMY    . TOTAL KNEE ARTHROPLASTY Right 02/03/2018   Procedure: TOTAL KNEE ARTHROPLASTY;  Surgeon: Vickki Hearing, MD;  Location: [facility];  Service: Orthopedics;  Laterality: Right;  . TOTAL KNEE ARTHROPLASTY Left 10/05/2019   Procedure: LEFT TOTAL KNEE ARTHROPLASTY;  Surgeon: Vickki Hearing, MD;  Location: [facility];  Service: Orthopedics;  Laterality: Left;  Marland Kitchen VASECTOMY      There were no vitals filed for this visit.  Subjective Assessment - 10/19/19 1319    Subjective  Pt states that he was in the hospital for four days due to his infection.  He is still getting IV antibiotics.  He is still is a lot of pain; he is only sleeping about an hour at a time.    Pertinent History  OA, Rt  TKR    Limitations  Sitting;Lifting;Standing;Walking;House hold activities    How long can you sit comfortably?  15 minutes    How long can you stand comfortably?  10-15 minutes    How long can you walk comfortably?  2-3 minutes    Patient Stated Goals  less pain,better motion    Currently in Pain?  Yes    Pain Score  8     Pain Location  Knee    Pain Orientation  Left    Pain Type  Acute pain    Pain Onset  In the past 7 days    Pain Frequency  Constant    Aggravating Factors   weight bearing    Pain Relieving Factors  nost sure    Effect of Pain on Daily Activities  limits                       [facility] Adult PT Treatment/Exercise - 10/19/19 0001      Exercises   Exercises  Knee/Hip      Knee/Hip Exercises: Stretches   Knee: Self-Stretch to increase Flexion  3 reps;30 seconds    Gastroc Stretch Limitations  slant board 30" x 3       Knee/Hip Exercises: Standing   Heel Raises  15  reps      Knee/Hip Exercises: Seated   Long Arc Quad  Left;10 reps    Heel Slides  10 reps      Knee/Hip Exercises: Supine   Quad Sets  10 reps    Knee Extension Limitations  8    Knee Flexion Limitations  80      Manual Therapy   Manual Therapy  Edema management    Manual therapy comments  completed seperate from other skilled therapy    Edema Management  retro massage to decrease swelling improve pain.               PT Short Term Goals - 10/19/19 1338      PT SHORT TERM GOAL #1   Title  PT pain to be no greater than a 5/10 to allow pt to sleep for 3 hours at a time    Time  3    Period  Weeks    Status  On-going    Target Date  10/29/19      PT SHORT TERM GOAL #2   Title  Pt knee extension to be less than 3 degrees to allow pt to have a more normalized gt.    Time  3    Period  Weeks    Status  On-going      PT SHORT TERM GOAL #3   Title  PT knee flexion to be at least 95 to allow pt to sit for 40 minutes in comfort for traveling/watching tv.    Time  3     Period  Weeks    Status  On-going        PT Long Term Goals - 10/19/19 1339      PT LONG TERM GOAL #1   Title  Pt Lt knee pain to be no greater than a 2/10 to allow pt to sleep for six hours.    Time  6    Period  Weeks    Status  On-going      PT LONG TERM GOAL #2   Title  PT Lt knee flexion to be to 120 to allow pt to squat down to pick items of the floor and work in the yard.    Time  6    Period  Weeks    Status  On-going      PT LONG TERM GOAL #3   Title  Pt LE strength to be increased one grade to allow pt to be ambulating without an assistive device.    Time  6    Period  Weeks    Status  On-going      PT LONG TERM GOAL #4   Title  Pt to be ambulating without an assistive device at least 350 ft in three minutes to demonstrate comnmunity ambulator.    Time  6    Period  Weeks    Status  On-going            Plan - 10/19/19 1405    Clinical Impression Statement  PT continues to have a high amount of pain as well as significant decreased ROM.  Therapist emphasized the importance of pt working on his ROM at home.  Therapist initiated manual to assist in reducing edema and pain.    Personal Factors and Comorbidities  Comorbidity 1    Examination-Activity Limitations  Bathing;Carry;Bed Mobility;Dressing;Lift;Locomotion Level;Sit;Sleep;Squat;Stairs;Stand;Transfers    Examination-Participation Restrictions  Cleaning;Community Activity;Driving;Laundry;Shop;Meal Prep;Yard Work    Conservation officer, historic buildingstability/Clinical Decision Making  Stable/Uncomplicated    Rehab Potential  Good    PT Frequency  3x / week    PT Duration  6 weeks    PT Treatment/Interventions  Passive range of motion;Manual techniques;Patient/family education;Therapeutic activities;Therapeutic exercise;Balance training;Stair training;Gait training;Cryotherapy;Electrical Stimulation    PT Next Visit Plan  Complete foto, continue to focus on ROM begin gentle PROM    PT Home Exercise Plan  LAQ< heelslides both sitting and  supine, quad set.       Patient will benefit from skilled therapeutic intervention in order to improve the following deficits and impairments:  Abnormal gait, Decreased activity tolerance, Decreased balance, Decreased strength, Difficulty walking, Decreased range of motion, Increased edema, Pain  Visit Diagnosis: Acute pain of left knee  Stiffness of left knee, not elsewhere classified  Other abnormalities of gait and mobility  Muscle weakness (generalized)     Problem List Patient Active Problem List   Diagnosis Date Noted  . S/P total knee replacement, left 10/05/2019 10/19/2019  . Cellulitis 10/08/2019  . Acute knee pain 10/08/2019  . Primary osteoarthritis of left knee   . S/P total knee replacement, right 02/03/18     Rayetta Humphrey, PT CLT [phone] 10/19/2019, 2:10 PM  [place], [place], [address] Phone: [phone]   Fax:  [phone]  Name: Melvin Turner MRN: [id number] Date of Birth: 03-15-1956

## 2019-10-20 ENCOUNTER — Ambulatory Visit (INDEPENDENT_AMBULATORY_CARE_PROVIDER_SITE_OTHER): Payer: No Typology Code available for payment source | Admitting: Orthopedic Surgery

## 2019-10-20 ENCOUNTER — Encounter: Payer: Self-pay | Admitting: Orthopedic Surgery

## 2019-10-20 DIAGNOSIS — Z96652 Presence of left artificial knee joint: Secondary | ICD-10-CM

## 2019-10-20 MED ORDER — OXYCODONE HCL 15 MG PO TABS: 15.0000 mg | ORAL_TABLET | ORAL | 0 refills | Status: AC | PRN

## 2019-10-20 NOTE — Progress Notes (Addendum)
Chief Complaint  Patient presents with  . Routine Post Op    10/05/2019 left knee replacement improving will have PICC line removed Friday   . Medication Refill    oxycodone     63 year old male had a left total knee on November 17 did well had to be readmitted on 10/08/2019 with cellulitis left knee possible abductor canal hematoma.  He was placed on vancomycin IV PICC line was placed he was sent home he comes in for his follow-up visit  He is much improved with less pain swelling redness and ecchymosis on the medial side of the knee and now his knee flexion is up to 95 degrees he is walking with a walker walker anxious to get to a cane  His PICC line will be stopped on 4 December I will see him next week off his Vanco for several days  He is now on an oxycodone taper.  We had to go up to 15 mg to get him his pain under control as he had preop opioid tolerance and was seeing pain management  He was started on oxycodone taper with 15 mg and decrease to 5 mg each prescription subsequent to this 1  Follow-up in a week  Encounter Diagnosis  Name Primary?  . S/P total knee replacement, left 10/05/2019 Yes   10/26/2019 4:08 PM  October 18, 2019 at [facility] lab values were glucose 108 creatinine 0.66 GFR was 104 non-African-American BUN/creatinine ratio was 20 vancomycin trough was 7.2 sed rate was 28 and C-reactive protein was 46

## 2019-10-20 NOTE — Patient Instructions (Signed)
Remove bandage Friday  picc line out Friday dec 4   Continue therapy   Taper the oxycodone 15 mg this Rx, 10 mg next rx, 5 mg next rx

## 2019-10-21 ENCOUNTER — Ambulatory Visit (HOSPITAL_COMMUNITY): Payer: No Typology Code available for payment source | Admitting: Physical Therapy

## 2019-10-21 ENCOUNTER — Other Ambulatory Visit: Payer: Self-pay

## 2019-10-21 DIAGNOSIS — M25562 Pain in left knee: Secondary | ICD-10-CM

## 2019-10-21 DIAGNOSIS — M6281 Muscle weakness (generalized): Secondary | ICD-10-CM

## 2019-10-21 DIAGNOSIS — R2689 Other abnormalities of gait and mobility: Secondary | ICD-10-CM

## 2019-10-21 DIAGNOSIS — M25662 Stiffness of left knee, not elsewhere classified: Secondary | ICD-10-CM

## 2019-10-21 NOTE — Therapy (Signed)
[facility] [address] [place], [place], [address] Phone: [phone]   Fax:  [phone]  Physical Therapy Treatment  Patient Details  Name: Melvin Turner MRN: [id number] Date of Birth: 1955-12-01 Referring Provider (PT): Fuller Canada ROM [phone] Encounter Date: 10/21/2019  PT End of Session - 10/21/19 1528    Visit Number  3    Number of Visits  18    Date for PT Re-Evaluation  11/19/19    Authorization - Visit Number  3    Authorization - Number of Visits  10    PT Start Time  1445    PT Stop Time  1525    PT Time Calculation (min)  40 min    Activity Tolerance  Patient tolerated treatment well;Patient limited by pain;Patient limited by fatigue    Behavior During Therapy  [facility] for tasks assessed/performed       Past Medical History:  Diagnosis Date  . Arthritis   . BPH (benign prostatic hyperplasia)   . GERD (gastroesophageal reflux disease)   . Hypertension     Past Surgical History:  Procedure Laterality Date  . COLONOSCOPY    . KNEE ARTHROSCOPY Right 01/31/2015   Procedure: RIGHT KNEE ARTHROSCOPY, PARTIAL MEDIAL MENISECTOMY;  Surgeon: Darreld Mclean, MD;  Location: [facility];  Service: Orthopedics;  Laterality: Right;  . TONSILLECTOMY    . TOTAL KNEE ARTHROPLASTY Right 02/03/2018   Procedure: TOTAL KNEE ARTHROPLASTY;  Surgeon: Vickki Hearing, MD;  Location: [facility];  Service: Orthopedics;  Laterality: Right;  . TOTAL KNEE ARTHROPLASTY Left 10/05/2019   Procedure: LEFT TOTAL KNEE ARTHROPLASTY;  Surgeon: Vickki Hearing, MD;  Location: [facility];  Service: Orthopedics;  Laterality: Left;  Marland Kitchen VASECTOMY      There were no vitals filed for this visit.  Subjective Assessment - 10/21/19 1447    Subjective  Pt states that he is doing his exercises here and there.    Pertinent History  OA, Rt TKR    Limitations  Sitting;Lifting;Standing;Walking;House hold activities    How long can you sit comfortably?  15 minutes    How  long can you stand comfortably?  10-15 minutes    How long can you walk comfortably?  2-3 minutes    Patient Stated Goals  less pain,better motion    Currently in Pain?  Yes    Pain Score  5     Pain Location  Knee    Pain Orientation  Left    Pain Descriptors / Indicators  Aching    Pain Type  Chronic pain    Pain Onset  In the past 7 days    Pain Frequency  Intermittent    Aggravating Factors   weight bearing    Pain Relieving Factors  icing              [facility] Adult PT Treatment/Exercise - 10/21/19 0001      Exercises   Exercises  Knee/Hip      Knee/Hip Exercises: Stretches   Knee: Self-Stretch to increase Flexion  3 reps;30 seconds    Gastroc Stretch Limitations  slant board 30" x 3       Knee/Hip Exercises: Standing   Heel Raises  15 reps    Knee Flexion  10 reps    Terminal Knee Extension  10 reps    Functional Squat  10 reps    Rocker Board  2 minutes    SLS  x 3  with UE support      Knee/Hip Exercises: Seated   Long Arc Quad  Left;10 reps    Heel Slides  10 reps      Knee/Hip Exercises: Supine   Quad Sets  10 reps    Heel Slides  5 reps    Knee Extension Limitations  7   AROM   Knee Flexion  PROM   gentle   Knee Flexion Limitations  92   AROM   Other Supine Knee/Hip Exercises  gentle PROM       Manual Therapy   Manual Therapy  Edema management    Manual therapy comments  completed seperate from other skilled therapy    Edema Management  retro massage to decrease swelling improve pain.               PT Short Term Goals - 10/19/19 1338      PT SHORT TERM GOAL #1   Title  PT pain to be no greater than a 5/10 to allow pt to sleep for 3 hours at a time    Time  3    Period  Weeks    Status  On-going    Target Date  10/29/19      PT SHORT TERM GOAL #2   Title  Pt knee extension to be less than 3 degrees to allow pt to have a more normalized gt.    Time  3    Period  Weeks    Status  On-going      PT SHORT TERM GOAL #3   Title   PT knee flexion to be at least 95 to allow pt to sit for 40 minutes in comfort for traveling/watching tv.    Time  3    Period  Weeks    Status  On-going        PT Long Term Goals - 10/19/19 1339      PT LONG TERM GOAL #1   Title  Pt Lt knee pain to be no greater than a 2/10 to allow pt to sleep for six hours.    Time  6    Period  Weeks    Status  On-going      PT LONG TERM GOAL #2   Title  PT Lt knee flexion to be to 120 to allow pt to squat down to pick items of the floor and work in the yard.    Time  6    Period  Weeks    Status  On-going      PT LONG TERM GOAL #3   Title  Pt LE strength to be increased one grade to allow pt to be ambulating without an assistive device.    Time  6    Period  Weeks    Status  On-going      PT LONG TERM GOAL #4   Title  Pt to be ambulating without an assistive device at least 350 ft in three minutes to demonstrate comnmunity ambulator.    Time  6    Period  Weeks    Status  On-going            Plan - 10/21/19 1529    Clinical Impression Statement  PT with improved ROM today.  Therapist increased height of RW to allow better body mechanics when walking.  Gt trained with emphasis on heel toe gt.    Personal Factors and Comorbidities  Comorbidity 1    Examination-Activity  Limitations  Bathing;Carry;Bed Mobility;Dressing;Lift;Locomotion Level;Sit;Sleep;Squat;Stairs;Stand;Transfers    Examination-Participation Restrictions  Cleaning;Community Activity;Driving;Laundry;Shop;Meal Prep;Yard Work    Stability/Clinical Decision Making  Stable/Uncomplicated    Rehab Potential  Good    PT Frequency  3x / week    PT Duration  6 weeks    PT Treatment/Interventions  Passive range of motion;Manual techniques;Patient/family education;Therapeutic activities;Therapeutic exercise;Balance training;Stair training;Gait training;Cryotherapy;Electrical Stimulation    PT Next Visit Plan  continue to focus on ROM    PT Home Exercise Plan  LAQ< heelslides  both sitting and supine, quad set.       Patient will benefit from skilled therapeutic intervention in order to improve the following deficits and impairments:  Abnormal gait, Decreased activity tolerance, Decreased balance, Decreased strength, Difficulty walking, Decreased range of motion, Increased edema, Pain  Visit Diagnosis: Acute pain of left knee  Stiffness of left knee, not elsewhere classified  Other abnormalities of gait and mobility  Muscle weakness (generalized)     Problem List Patient Active Problem List   Diagnosis Date Noted  . S/P total knee replacement, left 10/05/2019 10/19/2019  . Cellulitis 10/08/2019  . Acute knee pain 10/08/2019  . Primary osteoarthritis of left knee   . S/P total knee replacement, right 02/03/18    Virgina Organynthia Niyana Chesbro, PT CLT [phone] 10/21/2019, 3:32 PM  [facility] [address] [place], [place], [address] Phone: [phone]   Fax:  [phone]  Name: Melvin Turner MRN: [id number] Date of Birth: March 03, 1956

## 2019-10-22 ENCOUNTER — Ambulatory Visit (HOSPITAL_COMMUNITY): Payer: No Typology Code available for payment source

## 2019-10-22 ENCOUNTER — Encounter (HOSPITAL_COMMUNITY): Payer: Self-pay

## 2019-10-22 DIAGNOSIS — M25562 Pain in left knee: Secondary | ICD-10-CM

## 2019-10-22 DIAGNOSIS — M25662 Stiffness of left knee, not elsewhere classified: Secondary | ICD-10-CM

## 2019-10-22 DIAGNOSIS — M6281 Muscle weakness (generalized): Secondary | ICD-10-CM

## 2019-10-22 DIAGNOSIS — R2689 Other abnormalities of gait and mobility: Secondary | ICD-10-CM

## 2019-10-22 NOTE — Therapy (Addendum)
[facility] [address] [place], [place], [address] Phone: [phone]   Fax:  [phone]  Physical Therapy Treatment  Patient Details  Name: Melvin Turner MRN: [id number] Date of Birth: 15-Jul-1956 Referring Provider (PT): Fuller Canada   Encounter Date: 10/22/2019  PT End of Session - 10/22/19 1148    Visit Number  4    Number of Visits  18    Date for PT Re-Evaluation  11/19/19    Authorization Type  [employer] GPA Member    Authorization Time Period  11/20-->11/19/19    Authorization - Visit Number  4    Authorization - Number of Visits  10    PT Start Time  1130   6' on bike, not included wiht charges   PT Stop Time  1223    PT Time Calculation (min)  53 min    Activity Tolerance  Patient tolerated treatment well;Patient limited by pain;Patient limited by fatigue    Behavior During Therapy  [facility] for tasks assessed/performed       Past Medical History:  Diagnosis Date  . Arthritis   . BPH (benign prostatic hyperplasia)   . GERD (gastroesophageal reflux disease)   . Hypertension     Past Surgical History:  Procedure Laterality Date  . COLONOSCOPY    . KNEE ARTHROSCOPY Right 01/31/2015   Procedure: RIGHT KNEE ARTHROSCOPY, PARTIAL MEDIAL MENISECTOMY;  Surgeon: Darreld Mclean, MD;  Location: [facility];  Service: Orthopedics;  Laterality: Right;  . TONSILLECTOMY    . TOTAL KNEE ARTHROPLASTY Right 02/03/2018   Procedure: TOTAL KNEE ARTHROPLASTY;  Surgeon: Vickki Hearing, MD;  Location: [facility];  Service: Orthopedics;  Laterality: Right;  . TOTAL KNEE ARTHROPLASTY Left 10/05/2019   Procedure: LEFT TOTAL KNEE ARTHROPLASTY;  Surgeon: Vickki Hearing, MD;  Location: [facility];  Service: Orthopedics;  Laterality: Left;  Marland Kitchen VASECTOMY      There were no vitals filed for this visit.  Subjective Assessment - 10/22/19 1144    Subjective  Pt stated he is ready to take a real shower, supposed to remove the pic line from arm.  Reports they  removed the stiches from knee earlier this week.  Today's first day driving.    Pertinent History  OA, Rt TKR    Patient Stated Goals  less pain,better motion    Currently in Pain?  Yes    Pain Score  7     Pain Location  Knee    Pain Orientation  Left    Pain Descriptors / Indicators  Aching;Sore    Pain Type  Chronic pain    Pain Onset  In the past 7 days    Pain Frequency  Intermittent    Aggravating Factors   weight bearing    Pain Relieving Factors  icing    Effect of Pain on Daily Activities  limits                       [facility] Adult PT Treatment/Exercise - 10/22/19 0001      Exercises   Exercises  Knee/Hip      Knee/Hip Exercises: Stretches   Active Hamstring Stretch  2 reps;30 seconds    Active Hamstring Stretch Limitations  12in step    Knee: Self-Stretch to increase Flexion  5 reps;10 seconds    Knee: Self-Stretch Limitations  knee drives on 12in step    Gastroc Stretch Limitations  slant board 30" x 3  Knee/Hip Exercises: Standing   Heel Raises  15 reps    Heel Raises Limitations  slope    Terminal Knee Extension  Right;10 reps;Theraband    Theraband Level (Terminal Knee Extension)  Level 2 (Red)    Terminal Knee Extension Limitations  5" holds    Functional Squat  10 reps    Rocker Board  2 minutes    Rocker Board Limitations  lateral    SLS  x 3    Lt 16",  Rt 3x10" with 1 HHA     Knee/Hip Exercises: Seated   Heel Slides  10 reps    Sit to Sand  5 reps;without UE support   eccentric control, equal weight bearing     Knee/Hip Exercises: Supine   Quad Sets  10 reps    Short Arc Quad Sets  15 reps    Heel Slides  10 reps    Knee Extension  AROM    Knee Extension Limitations  6    Knee Flexion  AROM    Knee Flexion Limitations  90    Other Supine Knee/Hip Exercises  Gentle PROM 92 degrees      Manual Therapy   Manual Therapy  Edema management    Manual therapy comments  completed seperate from other skilled therapy    Edema  Management  retro massage to decrease swelling improve pain.               PT Short Term Goals - 10/19/19 1338      PT SHORT TERM GOAL #1   Title  PT pain to be no greater than a 5/10 to allow pt to sleep for 3 hours at a time    Time  3    Period  Weeks    Status  On-going    Target Date  10/29/19      PT SHORT TERM GOAL #2   Title  Pt knee extension to be less than 3 degrees to allow pt to have a more normalized gt.    Time  3    Period  Weeks    Status  On-going      PT SHORT TERM GOAL #3   Title  PT knee flexion to be at least 95 to allow pt to sit for 40 minutes in comfort for traveling/watching tv.    Time  3    Period  Weeks    Status  On-going        PT Long Term Goals - 10/19/19 1339      PT LONG TERM GOAL #1   Title  Pt Lt knee pain to be no greater than a 2/10 to allow pt to sleep for six hours.    Time  6    Period  Weeks    Status  On-going      PT LONG TERM GOAL #2   Title  PT Lt knee flexion to be to 120 to allow pt to squat down to pick items of the floor and work in the yard.    Time  6    Period  Weeks    Status  On-going      PT LONG TERM GOAL #3   Title  Pt LE strength to be increased one grade to allow pt to be ambulating without an assistive device.    Time  6    Period  Weeks    Status  On-going      PT LONG  TERM GOAL #4   Title  Pt to be ambulating without an assistive device at least 350 ft in three minutes to demonstrate comnmunity ambulator.    Time  6    Period  Weeks    Status  On-going            Plan - 10/22/19 1232    Clinical Impression Statement  ROM is progressing well.  Added rocking on bike, SAQ and STS to POC for flexoin, extension and gluteal strengthening.  AROM improved 6-90 (was 7-PROM92 last session).  Pain scale high thorugh session, minimal reports of increased pain through session.    Personal Factors and Comorbidities  Comorbidity 1    Examination-Activity Limitations  Bathing;Carry;Bed  Mobility;Dressing;Lift;Locomotion Level;Sit;Sleep;Squat;Stairs;Stand;Transfers    Examination-Participation Restrictions  Cleaning;Community Activity;Driving;Laundry;Shop;Meal Prep;Yard Work    Stability/Clinical Decision Making  Stable/Uncomplicated    Clinical Decision Making  Low    Rehab Potential  Good    PT Frequency  3x / week    PT Duration  6 weeks    PT Treatment/Interventions  Passive range of motion;Manual techniques;Patient/family education;Therapeutic activities;Therapeutic exercise;Balance training;Stair training;Gait training;Cryotherapy;Electrical Stimulation    PT Next Visit Plan  continue to focus on ROM    PT Home Exercise Plan  LAQ< heelslides both sitting and supine, quad set.       Patient will benefit from skilled therapeutic intervention in order to improve the following deficits and impairments:  Abnormal gait, Decreased activity tolerance, Decreased balance, Decreased strength, Difficulty walking, Decreased range of motion, Increased edema, Pain  Visit Diagnosis: Acute pain of left knee  Stiffness of left knee, not elsewhere classified  Other abnormalities of gait and mobility  Muscle weakness (generalized)     Problem List Patient Active Problem List   Diagnosis Date Noted  . S/P total knee replacement, left 10/05/2019 10/19/2019  . Cellulitis 10/08/2019  . Acute knee pain 10/08/2019  . Primary osteoarthritis of left knee   . S/P total knee replacement, right 02/03/18    Becky Saxasey Cockerham, LPTA; CBIS [phone]  Juel BurrowCockerham, Casey Jo 10/22/2019, 12:48 PM  [place] [facility] [address] [place], [place], [address] Phone: [phone]   Fax:  [phone]  Name: Melvin Turner MRN: [id number] Date of Birth: 04/29/1956

## 2019-10-25 ENCOUNTER — Other Ambulatory Visit: Payer: Self-pay

## 2019-10-25 ENCOUNTER — Ambulatory Visit (HOSPITAL_COMMUNITY): Payer: No Typology Code available for payment source | Admitting: Physical Therapy

## 2019-10-25 DIAGNOSIS — M25562 Pain in left knee: Secondary | ICD-10-CM

## 2019-10-25 DIAGNOSIS — M25662 Stiffness of left knee, not elsewhere classified: Secondary | ICD-10-CM

## 2019-10-25 DIAGNOSIS — R2689 Other abnormalities of gait and mobility: Secondary | ICD-10-CM

## 2019-10-25 NOTE — Therapy (Signed)
[facility] [address] [place], [place], [address] Phone: [phone]   Fax:  [phone]  Physical Therapy Treatment   # OF FEET WALKED: community ambulator with SPC ROM:  Flexion: 98            Extension: 6 from neutral  Patient Details  Name: Melvin CarinaGary F Lamoreaux MRN: [id number] Date of Birth: 1956/02/13 Referring Provider (PT): Fuller CanadaStanley Harrison   Encounter Date: 10/25/2019  PT End of Session - 10/25/19 1224    Visit Number  5    Number of Visits  18    Date for PT Re-Evaluation  11/19/19    Authorization Type  [employer] GPA Member    Authorization Time Period  11/20-->11/19/19    Authorization - Visit Number  5    Authorization - Number of Visits  10    PT Start Time  1140    PT Stop Time  1225    PT Time Calculation (min)  45 min    Activity Tolerance  Patient tolerated treatment well;Patient limited by pain;Patient limited by fatigue    Behavior During Therapy  [facility] for tasks assessed/performed       Past Medical History:  Diagnosis Date  . Arthritis   . BPH (benign prostatic hyperplasia)   . GERD (gastroesophageal reflux disease)   . Hypertension     Past Surgical History:  Procedure Laterality Date  . COLONOSCOPY    . KNEE ARTHROSCOPY Right 01/31/2015   Procedure: RIGHT KNEE ARTHROSCOPY, PARTIAL MEDIAL MENISECTOMY;  Surgeon: Darreld McleanWayne Keeling, MD;  Location: [facility];  Service: Orthopedics;  Laterality: Right;  . TONSILLECTOMY    . TOTAL KNEE ARTHROPLASTY Right 02/03/2018   Procedure: TOTAL KNEE ARTHROPLASTY;  Surgeon: Vickki HearingHarrison, Stanley E, MD;  Location: [facility];  Service: Orthopedics;  Laterality: Right;  . TOTAL KNEE ARTHROPLASTY Left 10/05/2019   Procedure: LEFT TOTAL KNEE ARTHROPLASTY;  Surgeon: Vickki HearingHarrison, Stanley E, MD;  Location: [facility];  Service: Orthopedics;  Laterality: Left;  Marland Kitchen. VASECTOMY      There were no vitals filed for this visit.  Subjective Assessment - 10/25/19 1146    Subjective  pt states his pain is 3/10.   [facility]                       [facility]/Ex[facility] - 10/25/19 0001      Ambulation/Gait   Gait Comments  no AD working on equal stride length, heel to toe      Exercises   Exercises  Knee/Hip      Knee/Hip Exercises: Stretches   Active Hamstring Stretch  30 seconds;3 reps    Active Hamstring Stretch Limitations  12in step    Knee: Self-Stretch to increase Flexion  5 reps;10 seconds    Knee: Self-Stretch Limitations  knee drives on 12in step    Gastroc Stretch Limitations  slant board 30" x 3       Knee/Hip Exercises: Standing   Knee Flexion  10 reps      Knee/Hip Exercises: Seated   Sit to Sand  5 reps;without UE support      Knee/Hip Exercises: Supine   Quad Sets  10 reps    Short Arc Quad Sets  15 reps    Heel Slides  10 reps    Knee Extension  AROM    Knee Extension Limitations  6    Knee Flexion  AROM    Knee Flexion Limitations  98    Other Supine Knee/Hip Exercises  Gentle PROM 92 degrees      Manual Therapy   Manual Therapy  Edema management;Myofascial release    Manual therapy comments  completed seperate from other skilled therapy    Edema Management  retro massage to decrease swelling improve pain.    Myofascial Release  to reduce adhesions perimeter of knee and over scar               PT Short Term Goals - 10/19/19 1338      PT SHORT TERM GOAL #1   Title  PT pain to be no greater than a 5/10 to allow pt to sleep for 3 hours at a time    Time  3    Period  Weeks    Status  On-going    Target Date  10/29/19      PT SHORT TERM GOAL #2   Title  Pt knee extension to be less than 3 degrees to allow pt to have a more normalized gt.    Time  3    Period  Weeks    Status  On-going      PT SHORT TERM GOAL #3   Title  PT knee flexion to be at least 95 to allow pt to sit for 40 minutes in comfort for traveling/watching tv.    Time  3    Period  Weeks    Status  On-going        PT Long Term Goals -  10/19/19 1339      PT LONG TERM GOAL #1   Title  Pt Lt knee pain to be no greater than a 2/10 to allow pt to sleep for six hours.    Time  6    Period  Weeks    Status  On-going      PT LONG TERM GOAL #2   Title  PT Lt knee flexion to be to 120 to allow pt to squat down to pick items of the floor and work in the yard.    Time  6    Period  Weeks    Status  On-going      PT LONG TERM GOAL #3   Title  Pt LE strength to be increased one grade to allow pt to be ambulating without an assistive device.    Time  6    Period  Weeks    Status  On-going      PT LONG TERM GOAL #4   Title  Pt to be ambulating without an assistive device at least 350 ft in three minutes to demonstrate comnmunity ambulator.    Time  6    Period  Weeks    Status  On-going            Plan - 10/25/19 1224    Clinical Impression Statement  ROM continues to progress along with increased flexion to 98 degrees today (90 at last visit) and extension holding at 6 degrees from neutral.  Primary focus continues on ROM, however did work on ambulation without AD today with cues for heel to toe and upright posturing.  Myofascial techniques added today to help reduce scar tissue perimeter of knee and over scar line.  Progressing towards goals with continued skilled need for therapy.    Personal Factors and Comorbidities  Comorbidity 1    Examination-Activity Limitations  Bathing;Carry;Bed Mobility;Dressing;Lift;Locomotion Level;Sit;Sleep;Squat;Stairs;Stand;Transfers    Examination-Participation Restrictions  Cleaning;Community Activity;Driving;Laundry;Shop;Meal Prep;[employer]  Work    Stability/Clinical Decision Making  Stable/Uncomplicated    Rehab Potential  Good    PT Frequency  3x / week    PT Duration  6 weeks    PT Treatment/Interventions  Passive range of motion;Manual techniques;Patient/family education;Therapeutic activities;Therapeutic exercise;Balance training;Stair training;Gait training;Cryotherapy;Electrical  Stimulation    PT Next Visit Plan  continue to focus on ROM, gait with LRAD and reducing scar tissue/adhesions.    PT Home Exercise Plan  LAQ< heelslides both sitting and supine, quad set.       Patient will benefit from skilled therapeutic intervention in order to improve the following deficits and impairments:  Abnormal gait, Decreased activity tolerance, Decreased balance, Decreased strength, Difficulty walking, Decreased range of motion, Increased edema, Pain  Visit Diagnosis: Stiffness of left knee, not elsewhere classified  Acute pain of left knee  Other abnormalities of gait and mobility     Problem List Patient Active Problem List   Diagnosis Date Noted  . S/P total knee replacement, left 10/05/2019 10/19/2019  . Cellulitis 10/08/2019  . Acute knee pain 10/08/2019  . Primary osteoarthritis of left knee   . S/P total knee replacement, right 02/03/18    Teena Irani, PTA/CLT [phone]  Teena Irani 10/25/2019, 12:35 PM  [place] [address] [place], [place], [address] Phone: [phone]   Fax:  [phone]  Name: ELIAZAR OLIVAR MRN: [id number] Date of Birth: 1956-11-05

## 2019-10-27 ENCOUNTER — Ambulatory Visit (INDEPENDENT_AMBULATORY_CARE_PROVIDER_SITE_OTHER): Payer: No Typology Code available for payment source | Admitting: Orthopedic Surgery

## 2019-10-27 ENCOUNTER — Other Ambulatory Visit: Payer: Self-pay

## 2019-10-27 ENCOUNTER — Ambulatory Visit (HOSPITAL_COMMUNITY): Payer: No Typology Code available for payment source | Admitting: Physical Therapy

## 2019-10-27 ENCOUNTER — Encounter: Payer: Self-pay | Admitting: Orthopedic Surgery

## 2019-10-27 DIAGNOSIS — M25662 Stiffness of left knee, not elsewhere classified: Secondary | ICD-10-CM

## 2019-10-27 DIAGNOSIS — M25562 Pain in left knee: Secondary | ICD-10-CM | POA: Diagnosis not present

## 2019-10-27 DIAGNOSIS — R2689 Other abnormalities of gait and mobility: Secondary | ICD-10-CM

## 2019-10-27 DIAGNOSIS — M6281 Muscle weakness (generalized): Secondary | ICD-10-CM

## 2019-10-27 DIAGNOSIS — Z96652 Presence of left artificial knee joint: Secondary | ICD-10-CM

## 2019-10-27 MED ORDER — HYDROCODONE-ACETAMINOPHEN 10-325 MG PO TABS: 1.0000 | ORAL_TABLET | Freq: Four times a day (QID) | ORAL | 0 refills | Status: DC | PRN

## 2019-10-27 NOTE — Progress Notes (Signed)
Chief Complaint  Patient presents with  . Routine Post Op    10/05/19 left knee replacement     Left total knee replacement complicated by hematoma or wound infection medial thigh after abductor canal hematoma  Patient finished his IV antibiotics he is doing well he said his pain is much better his knee flexion is 100 degrees he is walking with a cane he would like to decrease and stop his oxycodone  His knee looks great his wound looks good the swelling ecchymosis and redness that we treated on the medial thigh has resolved  Recommend Norco 08/20/2024 every 6 follow-up in 4 weeks  Encounter Diagnosis  Name Primary?  . S/P total knee replacement, left 10/05/2019 Yes   Meds ordered this encounter  Medications  . HYDROcodone-acetaminophen (NORCO) 10-325 MG tablet    Sig: Take 1 tablet by mouth every 6 (six) hours as needed for up to 7 days.    Dispense:  28 tablet    Refill:  0

## 2019-10-27 NOTE — Therapy (Signed)
[facility] [address] [place], [place], [address] Phone: [phone]   Fax:  [phone]  Physical Therapy Treatment # OF FEET WALKED: community ambulator with [place] ROM:  Flexion: 98            Extension: 6 from neutral Patient Details  Name: Melvin Turner MRN: [id number] Date of Birth: 1956-10-31 Referring Provider (PT): Arther Abbott   Encounter Date: 10/27/2019  PT End of Session - 10/27/19 1204    Visit Number  6    Number of Visits  18    Date for PT Re-Evaluation  11/19/19    Authorization Type  [employer] GPA Member    Authorization Time Period  11/20-->11/19/19    Authorization - Visit Number  5    Authorization - Number of Visits  10    PT Start Time  0277    PT Stop Time  1216    PT Time Calculation (min)  38 min    Activity Tolerance  Patient tolerated treatment well;Patient limited by pain;Patient limited by fatigue    Behavior During Therapy  [facility] for tasks assessed/performed       Past Medical History:  Diagnosis Date  . Arthritis   . BPH (benign prostatic hyperplasia)   . GERD (gastroesophageal reflux disease)   . Hypertension     Past Surgical History:  Procedure Laterality Date  . COLONOSCOPY    . KNEE ARTHROSCOPY Right 01/31/2015   Procedure: RIGHT KNEE ARTHROSCOPY, PARTIAL MEDIAL MENISECTOMY;  Surgeon: Sanjuana Kava, MD;  Location: [facility];  Service: Orthopedics;  Laterality: Right;  . TONSILLECTOMY    . TOTAL KNEE ARTHROPLASTY Right 02/03/2018   Procedure: TOTAL KNEE ARTHROPLASTY;  Surgeon: Carole Civil, MD;  Location: [facility];  Service: Orthopedics;  Laterality: Right;  . TOTAL KNEE ARTHROPLASTY Left 10/05/2019   Procedure: LEFT TOTAL KNEE ARTHROPLASTY;  Surgeon: Carole Civil, MD;  Location: [facility];  Service: Orthopedics;  Laterality: Left;  Marland Kitchen VASECTOMY      There were no vitals filed for this visit.  Subjective Assessment - 10/27/19 1203    Subjective  Pt states he is doing well and MD was  pleased with his progress.    Currently in Pain?  Yes    Pain Score  3     Pain Location  Leg    Pain Orientation  Left    Pain Descriptors / Indicators  Aching;Sore                       [facility] Adult PT Treatment/Exercise - 10/27/19 0001      Knee/Hip Exercises: Stretches   Active Hamstring Stretch  30 seconds;3 reps    Active Hamstring Stretch Limitations  12in step    Knee: Self-Stretch to increase Flexion  5 reps;10 seconds    Knee: Self-Stretch Limitations  knee drives on 41OI step    Gastroc Stretch Limitations  slant board 30" x 3       Knee/Hip Exercises: Standing   Heel Raises  15 reps    Knee Flexion  15 reps    Lateral Step Up  Left;Hand Hold: 1;10 reps;Step Height: 4"    Forward Step Up  Left;Hand Hold: 1;10 reps;Step Height: 4"    Functional Squat  10 reps    SLS  x 3    7" max of 5 on Lt LE   SLS with Vectors  Lt 5X5" with 1 HHA  Knee/Hip Exercises: Supine   Quad Sets  10 reps    Short Arc Quad Sets  15 reps    Heel Slides  10 reps    Knee Extension  AROM    Knee Extension Limitations  6    Knee Flexion  AROM    Knee Flexion Limitations  98    Other Supine Knee/Hip Exercises  Gentle PROM 92 degrees      Manual Therapy   Manual Therapy  Edema management;Myofascial release    Manual therapy comments  completed seperate from other skilled therapy    Edema Management  retro massage to decrease swelling improve pain.    Myofascial Release  to reduce adhesions perimeter of knee and over scar             PT Education - 10/27/19 1219    Education Details  educated to do more physical stretching rather than just using the CPM to improve AROM    Person(s) Educated  Patient    Methods  Explanation    Comprehension  Verbalized understanding       PT Short Term Goals - 10/19/19 1338      PT SHORT TERM GOAL #1   Title  PT pain to be no greater than a 5/10 to allow pt to sleep for 3 hours at a time    Time  3    Period  Weeks     Status  On-going    Target Date  10/29/19      PT SHORT TERM GOAL #2   Title  Pt knee extension to be less than 3 degrees to allow pt to have a more normalized gt.    Time  3    Period  Weeks    Status  On-going      PT SHORT TERM GOAL #3   Title  PT knee flexion to be at least 95 to allow pt to sit for 40 minutes in comfort for traveling/watching tv.    Time  3    Period  Weeks    Status  On-going        PT Long Term Goals - 10/19/19 1339      PT LONG TERM GOAL #1   Title  Pt Lt knee pain to be no greater than a 2/10 to allow pt to sleep for six hours.    Time  6    Period  Weeks    Status  On-going      PT LONG TERM GOAL #2   Title  PT Lt knee flexion to be to 120 to allow pt to squat down to pick items of the floor and work in the yard.    Time  6    Period  Weeks    Status  On-going      PT LONG TERM GOAL #3   Title  Pt LE strength to be increased one grade to allow pt to be ambulating without an assistive device.    Time  6    Period  Weeks    Status  On-going      PT LONG TERM GOAL #4   Title  Pt to be ambulating without an assistive device at least 350 ft in three minutes to demonstrate comnmunity ambulator.    Time  6    Period  Weeks    Status  On-going            Plan - 10/27/19 1205  Clinical Impression Statement  continued focus on ROM with addition of functional strengthening and static balance activities.  Max of 7" with SLS with addition of vector activitity to improve stabilizers.  Continued with myofascial techniques as well as retro massage to reduce edema and scar tissue. several "pops" obtained inferior knee along distal scar line.  Pt with difficulty getting flexion to 98 degrees today, extension still holding.    Personal Factors and Comorbidities  Comorbidity 1    Examination-Activity Limitations  Bathing;Carry;Bed Mobility;Dressing;Lift;Locomotion Level;Sit;Sleep;Squat;Stairs;Stand;Transfers    Examination-Participation Restrictions   Cleaning;Community Activity;Driving;Laundry;Shop;Meal Prep;Yard Work    Stability/Clinical Decision Making  Stable/Uncomplicated    Rehab Potential  Good    PT Frequency  3x / week    PT Duration  6 weeks    PT Treatment/Interventions  Passive range of motion;Manual techniques;Patient/family education;Therapeutic activities;Therapeutic exercise;Balance training;Stair training;Gait training;Cryotherapy;Electrical Stimulation    PT Next Visit Plan  continue to focus on ROM, gait with LRAD and reducing scar tissue/adhesions.    PT Home Exercise Plan  LAQ< heelslides both sitting and supine, quad set.       Patient will benefit from skilled therapeutic intervention in order to improve the following deficits and impairments:  Abnormal gait, Decreased activity tolerance, Decreased balance, Decreased strength, Difficulty walking, Decreased range of motion, Increased edema, Pain  Visit Diagnosis: Acute pain of left knee  Other abnormalities of gait and mobility  Stiffness of left knee, not elsewhere classified  Muscle weakness (generalized)     Problem List Patient Active Problem List   Diagnosis Date Noted  . S/P total knee replacement, left 10/05/2019 10/19/2019  . Cellulitis 10/08/2019  . Acute knee pain 10/08/2019  . Primary osteoarthritis of left knee   . S/P total knee replacement, right 02/03/18    Lurena NidaAmy B Zeyad Delaguila, PTA/CLT [phone]  Lurena NidaFrazier, Latif Nazareno B 10/27/2019, 12:20 PM  [facility] [address] [place], [place], [address] Phone: [phone]   Fax:  [phone]  Name: Laqueta CarinaGary F Nathaniel MRN: [id number] Date of Birth: Jan 13, 1956

## 2019-10-29 ENCOUNTER — Encounter (HOSPITAL_COMMUNITY): Payer: Self-pay | Admitting: Physical Therapy

## 2019-10-29 ENCOUNTER — Ambulatory Visit (HOSPITAL_COMMUNITY): Payer: No Typology Code available for payment source | Admitting: Physical Therapy

## 2019-10-29 ENCOUNTER — Other Ambulatory Visit: Payer: Self-pay

## 2019-10-29 DIAGNOSIS — M25662 Stiffness of left knee, not elsewhere classified: Secondary | ICD-10-CM

## 2019-10-29 DIAGNOSIS — M25562 Pain in left knee: Secondary | ICD-10-CM | POA: Diagnosis not present

## 2019-10-29 DIAGNOSIS — R2689 Other abnormalities of gait and mobility: Secondary | ICD-10-CM

## 2019-10-29 NOTE — Patient Instructions (Signed)
-  laying on your side - try a folded pillowcase or pillow or soft shirt under the side of your left knee and put a pillow or folded towel or pillowcases between your knees. you can also put pillow or towel roll behind back or/and in front in chest area -   deep breathing - 5-6 seconds in and then 5-6 seconds out - will be about 6 breaths a minute  try to do a few minutes worth of reps when in pain but also right before bed - know that you can stop to take a normal breath as needed.

## 2019-10-29 NOTE — Therapy (Signed)
[facility] [address] [place], [place], [address] Phone: [phone]   Fax:  [phone]  Physical Therapy Treatment  Patient Details  Name: Melvin Turner MRN: [id number] Date of Birth: [birth year] Referring Provider (PT): Fuller CanadaStanley Harrison   Encounter Date: 10/29/2019  PT End of Session - 10/29/19 1518    Visit Number  7    Number of Visits  18    Date for PT Re-Evaluation  11/19/19    Authorization Type  PHCS GPA Member    Authorization Time Period  11/20-->11/19/19    Authorization - Visit Number  6    Authorization - Number of Visits  10    PT Start Time  1514    PT Stop Time  1600    PT Time Calculation (min)  46 min    Activity Tolerance  Patient tolerated treatment well;Patient limited by pain;Patient limited by fatigue    Behavior During Therapy  [facility] for tasks assessed/performed       Past Medical History:  Diagnosis Date  . Arthritis   . BPH (benign prostatic hyperplasia)   . GERD (gastroesophageal reflux disease)   . Hypertension     Past Surgical History:  Procedure Laterality Date  . COLONOSCOPY    . KNEE ARTHROSCOPY Right 01/31/2015   Procedure: RIGHT KNEE ARTHROSCOPY, PARTIAL MEDIAL MENISECTOMY;  Surgeon: Darreld McleanWayne Keeling, MD;  Location: [facility];  Service: Orthopedics;  Laterality: Right;  . TONSILLECTOMY    . TOTAL KNEE ARTHROPLASTY Right 02/03/2018   Procedure: TOTAL KNEE ARTHROPLASTY;  Surgeon: Vickki HearingHarrison, Stanley E, MD;  Location: [facility];  Service: Orthopedics;  Laterality: Right;  . TOTAL KNEE ARTHROPLASTY Left 10/05/2019   Procedure: LEFT TOTAL KNEE ARTHROPLASTY;  Surgeon: Vickki HearingHarrison, Stanley E, MD;  Location: [facility];  Service: Orthopedics;  Laterality: Left;  Marland Kitchen. VASECTOMY      There were no vitals filed for this visit.  Subjective Assessment - 10/29/19 1514    Subjective  Been doing a lot of running around today getting in and out of a truck. States yesterday was a bad day as he was sore and didn't do too much.  States his knee is just bothering him today. States that he almost paid and left because he is so tired, he just doesn't have any energy. States that he hasn't been sleeping, he can't get comfortable as he usually lays on his left side and that is really painful.    Pertinent History  OA, Rt TKR    Currently in Pain?  Yes    Pain Score  4     Pain Location  Leg    Pain Orientation  Left         [facility] PT Assessment - 10/29/19 0001      Assessment   Medical Diagnosis  Lt TKR    Referring Provider (PT)  Fuller CanadaStanley Harrison    Onset Date/Surgical Date  12/04/18                   [facility] Treatment/Exercise - 10/29/19 0001      Knee/Hip Exercises: Aerobic   Stationary Bike  5 minutes at start of session for warm up - not billed for      Manual Therapy   Manual Therapy  Edema management;Soft tissue mobilization    Manual therapy comments  completed seperate from other skilled therapy    Edema Management  retro massage to decrease swelling improve pain.    Soft  tissue mobilization  STM and IASTM with percussion gun on left quad - for vibration - not tolerated well              PT Education - 10/29/19 1604    Education Details  on deep breathing - to decrease SNS response and help down regulate patient when in pain/with elevated BP. Discussed daily monitoring of BP. Educated patient in use of ice/elevation and his tens unit as needed for pain. Educated patient in sleeping position and use of towels/pilows to assist with comfort and improve sleep.    Person(s) Educated  Patient    Methods  Explanation;Demonstration;Handout    Comprehension  Verbalized understanding;Returned demonstration       PT Short Term Goals - 10/19/19 1338      PT SHORT TERM GOAL #1   Title  PT pain to be no greater than a 5/10 to allow pt to sleep for 3 hours at a time    Time  3    Period  Weeks    Status  On-going    Target Date  10/29/19      PT SHORT TERM GOAL #2   Title  Pt  knee extension to be less than 3 degrees to allow pt to have a more normalized gt.    Time  3    Period  Weeks    Status  On-going      PT SHORT TERM GOAL #3   Title  PT knee flexion to be at least 95 to allow pt to sit for 40 minutes in comfort for traveling/watching tv.    Time  3    Period  Weeks    Status  On-going        PT Long Term Goals - 10/19/19 1339      PT LONG TERM GOAL #1   Title  Pt Lt knee pain to be no greater than a 2/10 to allow pt to sleep for six hours.    Time  6    Period  Weeks    Status  On-going      PT LONG TERM GOAL #2   Title  PT Lt knee flexion to be to 120 to allow pt to squat down to pick items of the floor and work in the yard.    Time  6    Period  Weeks    Status  On-going      PT LONG TERM GOAL #3   Title  Pt LE strength to be increased one grade to allow pt to be ambulating without an assistive device.    Time  6    Period  Weeks    Status  On-going      PT LONG TERM GOAL #4   Title  Pt to be ambulating without an assistive device at least 350 ft in three minutes to demonstrate comnmunity ambulator.    Time  6    Period  Weeks    Status  On-going            Plan - 10/29/19 1519    Clinical Impression Statement  Patient in a lot of pain at start of today's session with notable limp noted with walk back to treatment area. Increased respiratory rate noted with small walk to exercise bike. Took vitals with patient supine and at rest. BP was 168/82 and HR was 79bpm, saO2 was 98%. Discussed with patient elevated blood pressure and monitoring it daily and talking to  MD if it remains high. Focus was on pain management but manual interventions did not help during today's session. Educated patient on left side lying position with pillows/towels to assist and patient able to stay comfortably in this position for 6 minutes (usually it is immediately painful). Also educated patient in use of log roll with transitioning from supine to sit  secondary to back pain where they administered the nerve block. Patient tolerated this well. Educated patient in use of 5.5 second breath work to help decrease SNS and decrease pain, BP and help assist patient with transition to sleep at night. Performed one minute of 5.5 second inhale/exhale and afterwards, patient reported he had less pain with leaving. Instructed patient to focus on improving sleep quality and monitoring blood pressure and how this in turn should help decrease pain symptoms. Will follow up with patient about sleep next session.    Personal Factors and Comorbidities  Comorbidity 1    Examination-Activity Limitations  Bathing;Carry;Bed Mobility;Dressing;Lift;Locomotion Level;Sit;Sleep;Squat;Stairs;Stand;Transfers    Examination-Participation Restrictions  Cleaning;Community Activity;Driving;Laundry;Shop;Meal Prep;Yard Work    Stability/Clinical Decision Making  Stable/Uncomplicated    Rehab Potential  Good    PT Frequency  3x / week    PT Duration  6 weeks    PT Treatment/Interventions  Passive range of motion;Manual techniques;Patient/family education;Therapeutic activities;Therapeutic exercise;Balance training;Stair training;Gait training;Cryotherapy;Electrical Stimulation    PT Next Visit Plan  continue to focus on ROM, gait with LRAD and reducing scar tissue/adhesions.    PT Home Exercise Plan  LAQ< heelslides both sitting and supine, quad set. 12/11 deep breathing, log rolling       Patient will benefit from skilled therapeutic intervention in order to improve the following deficits and impairments:  Abnormal gait, Decreased activity tolerance, Decreased balance, Decreased strength, Difficulty walking, Decreased range of motion, Increased edema, Pain  Visit Diagnosis: Acute pain of left knee  Other abnormalities of gait and mobility  Stiffness of left knee, not elsewhere classified     Problem List Patient Active Problem List   Diagnosis Date Noted  . S/P total  knee replacement, left 10/05/2019 10/19/2019  . Cellulitis 10/08/2019  . Acute knee pain 10/08/2019  . Primary osteoarthritis of left knee   . S/P total knee replacement, right 02/03/18     4:15 PM, 10/29/19 Jerene Pitch, DPT Physical Therapy with [facility]  [phone] office  [place] [address] [place], [place], [address] Phone: [phone]   Fax:  [phone]  Name: Melvin Turner MRN: [id number] Date of Birth: 03-17-1956

## 2019-11-01 ENCOUNTER — Ambulatory Visit (HOSPITAL_COMMUNITY): Payer: No Typology Code available for payment source | Admitting: Physical Therapy

## 2019-11-01 ENCOUNTER — Encounter (HOSPITAL_COMMUNITY): Payer: Self-pay | Admitting: Physical Therapy

## 2019-11-01 ENCOUNTER — Other Ambulatory Visit: Payer: Self-pay

## 2019-11-01 DIAGNOSIS — M25662 Stiffness of left knee, not elsewhere classified: Secondary | ICD-10-CM

## 2019-11-01 DIAGNOSIS — M25562 Pain in left knee: Secondary | ICD-10-CM

## 2019-11-01 DIAGNOSIS — M6281 Muscle weakness (generalized): Secondary | ICD-10-CM

## 2019-11-01 DIAGNOSIS — R2689 Other abnormalities of gait and mobility: Secondary | ICD-10-CM

## 2019-11-01 NOTE — Therapy (Signed)
[place], [place], [address] Phone: [phone]   Fax:  [phone]  Physical Therapy Treatment  Patient Details  Name: Melvin Turner MRN: [id number] Date of Birth: [birth year] Referring Provider (PT): Arther Abbott   Encounter Date: 11/01/2019  PT End of Session - 11/01/19 0820    Visit Number  8    Number of Visits  18    Date for PT Re-Evaluation  11/19/19    Authorization Type  PHCS GPA Member    Authorization Time Period  11/20-->11/19/19    Authorization - Visit Number  8    Authorization - Number of Visits  10    PT Start Time  0815    PT Stop Time  9833    PT Time Calculation (min)  38 min    Activity Tolerance  Patient tolerated treatment well;Patient limited by pain;Patient limited by fatigue    Behavior During Therapy  [facility] for tasks assessed/performed       Past Medical History:  Diagnosis Date  . Arthritis   . BPH (benign prostatic hyperplasia)   . GERD (gastroesophageal reflux disease)   . Hypertension     Past Surgical History:  Procedure Laterality Date  . COLONOSCOPY    . KNEE ARTHROSCOPY Right 01/31/2015   Procedure: RIGHT KNEE ARTHROSCOPY, PARTIAL MEDIAL MENISECTOMY;  Surgeon: Sanjuana Kava, MD;  Location: [facility];  Service: Orthopedics;  Laterality: Right;  . TONSILLECTOMY    . TOTAL KNEE ARTHROPLASTY Right 02/03/2018   Procedure: TOTAL KNEE ARTHROPLASTY;  Surgeon: Carole Civil, MD;  Location: [facility];  Service: Orthopedics;  Laterality: Right;  . TOTAL KNEE ARTHROPLASTY Left 10/05/2019   Procedure: LEFT TOTAL KNEE ARTHROPLASTY;  Surgeon: Carole Civil, MD;  Location: [facility];  Service: Orthopedics;  Laterality: Left;  Marland Kitchen VASECTOMY      There were no vitals filed for this visit.  Subjective Assessment - 11/01/19 0818    Subjective  Patient reported feeling a lot better today. Stated his pain is not as bad today. Patient reported that his sleep has not been good.    Pertinent History  OA, Rt TKR    Currently in Pain?  No/denies                       [facility] Adult PT Treatment/Exercise - 11/01/19 0001      Knee/Hip Exercises: Stretches   Active Hamstring Stretch  30 seconds;3 reps    Active Hamstring Stretch Limitations  12in step    Knee: Self-Stretch to increase Flexion  10 seconds    Knee: Self-Stretch Limitations  knee drives on 82NK step. 10x    Gastroc Stretch  Both;3 reps;30 seconds      Knee/Hip Exercises: Standing   Lateral Step Up  Left;Hand Hold: 1;10 reps;Step Height: 4"    Forward Step Up  Left;Hand Hold: 1;10 reps;Step Height: 4"    Functional Squat  10 reps    SLS with Vectors  Lt 5X5" with 1 HHA      Knee/Hip Exercises: Supine   Quad Sets  10 reps    Short Arc Quad Sets  15 reps    Short Arc Quad Sets Limitations  Over foam roll    Knee Extension  AROM    Knee Extension Limitations  4    Knee Flexion  AROM    Knee Flexion Limitations  96      Manual Therapy   Manual  Therapy  Edema management;Joint mobilization    Manual therapy comments  completed seperate from other skilled interventions    Edema Management  retro massage to LT LE to decrease swelling improve pain.    Joint Mobilization  patellar mobilizations grade II for pain relief and improve mobility superior/inferior, left/right               PT Short Term Goals - 10/19/19 1338      PT SHORT TERM GOAL #1   Title  PT pain to be no greater than a 5/10 to allow pt to sleep for 3 hours at a time    Time  3    Period  Weeks    Status  On-going    Target Date  10/29/19      PT SHORT TERM GOAL #2   Title  Pt knee extension to be less than 3 degrees to allow pt to have a more normalized gt.    Time  3    Period  Weeks    Status  On-going      PT SHORT TERM GOAL #3   Title  PT knee flexion to be at least 95 to allow pt to sit for 40 minutes in comfort for traveling/watching tv.    Time  3    Period  Weeks    Status  On-going        PT  Long Term Goals - 10/19/19 1339      PT LONG TERM GOAL #1   Title  Pt Lt knee pain to be no greater than a 2/10 to allow pt to sleep for six hours.    Time  6    Period  Weeks    Status  On-going      PT LONG TERM GOAL #2   Title  PT Lt knee flexion to be to 120 to allow pt to squat down to pick items of the floor and work in the yard.    Time  6    Period  Weeks    Status  On-going      PT LONG TERM GOAL #3   Title  Pt LE strength to be increased one grade to allow pt to be ambulating without an assistive device.    Time  6    Period  Weeks    Status  On-going      PT LONG TERM GOAL #4   Title  Pt to be ambulating without an assistive device at least 350 ft in three minutes to demonstrate comnmunity ambulator.    Time  6    Period  Weeks    Status  On-going            Plan - 11/01/19 4268    Clinical Impression Statement  Patient with reported feeling much better this session. Resumed functional strengthening this session. Performed edema management this session, with patient tolerating well. Patient did report continued tenderness around patella this session, but reported feeling good following manual therapy. Patient would continue to benefit from skilled physical therapy in order to progress towards meeting functional goals.    Personal Factors and Comorbidities  Comorbidity 1    Examination-Activity Limitations  Bathing;Carry;Bed Mobility;Dressing;Lift;Locomotion Level;Sit;Sleep;Squat;Stairs;Stand;Transfers    Examination-Participation Restrictions  Cleaning;Community Activity;Driving;Laundry;Shop;Meal Prep;Yard Work    Stability/Clinical Decision Making  Stable/Uncomplicated    Rehab Potential  Good    PT Frequency  3x / week    PT Duration  6 weeks  PT Treatment/Interventions  Passive range of motion;Manual techniques;Patient/family education;Therapeutic activities;Therapeutic exercise;Balance training;Stair training;Gait training;Cryotherapy;Electrical Stimulation     PT Next Visit Plan  continue to focus on ROM, gait with LRAD and reducing scar tissue/adhesions.    PT Home Exercise Plan  LAQ< heelslides both sitting and supine, quad set. 12/11 deep breathing, log rolling       Patient will benefit from skilled therapeutic intervention in order to improve the following deficits and impairments:  Abnormal gait, Decreased activity tolerance, Decreased balance, Decreased strength, Difficulty walking, Decreased range of motion, Increased edema, Pain  Visit Diagnosis: Acute pain of left knee  Other abnormalities of gait and mobility  Stiffness of left knee, not elsewhere classified  Muscle weakness (generalized)     Problem List Patient Active Problem List   Diagnosis Date Noted  . S/P total knee replacement, left 10/05/2019 10/19/2019  . Cellulitis 10/08/2019  . Acute knee pain 10/08/2019  . Primary osteoarthritis of left knee   . S/P total knee replacement, right 02/03/18    Verne CarrowMacy Makinna Andy PT, DPT 9:29 AM, 11/01/19 [phone]  [facility] [address] [place], [place], [address] Phone: [phone]   Fax:  [phone]  Name: Melvin Turner MRN: [id number] Date of Birth: 13-May-1956

## 2019-11-02 ENCOUNTER — Other Ambulatory Visit: Payer: Self-pay

## 2019-11-02 DIAGNOSIS — Z96652 Presence of left artificial knee joint: Secondary | ICD-10-CM

## 2019-11-02 MED ORDER — HYDROCODONE-ACETAMINOPHEN 10-325 MG PO TABS: 1.0000 | ORAL_TABLET | Freq: Four times a day (QID) | ORAL | 0 refills | Status: AC | PRN

## 2019-11-02 NOTE — Telephone Encounter (Signed)
Hydrocodone-Acetaminophen  10/325mg   Qty 28 Tablets  Take 1 tablet by mouth every 6(six) hours as needed for up to 7 days.  PATIENT USES [place]

## 2019-11-03 ENCOUNTER — Encounter: Payer: Self-pay | Admitting: Orthopedic Surgery

## 2019-11-03 ENCOUNTER — Telehealth (HOSPITAL_COMMUNITY): Payer: Self-pay | Admitting: Physical Therapy

## 2019-11-03 ENCOUNTER — Encounter (HOSPITAL_COMMUNITY): Payer: No Typology Code available for payment source | Admitting: Physical Therapy

## 2019-11-03 NOTE — Telephone Encounter (Signed)
pt cancelled due to the weather 

## 2019-11-05 ENCOUNTER — Ambulatory Visit (HOSPITAL_COMMUNITY): Payer: No Typology Code available for payment source

## 2019-11-05 ENCOUNTER — Encounter (HOSPITAL_COMMUNITY): Payer: Self-pay

## 2019-11-05 ENCOUNTER — Other Ambulatory Visit: Payer: Self-pay

## 2019-11-05 DIAGNOSIS — R2689 Other abnormalities of gait and mobility: Secondary | ICD-10-CM

## 2019-11-05 DIAGNOSIS — M6281 Muscle weakness (generalized): Secondary | ICD-10-CM

## 2019-11-05 DIAGNOSIS — M25662 Stiffness of left knee, not elsewhere classified: Secondary | ICD-10-CM

## 2019-11-05 DIAGNOSIS — M25562 Pain in left knee: Secondary | ICD-10-CM | POA: Diagnosis not present

## 2019-11-05 NOTE — Therapy (Signed)
[facility] [address] [place], [place], [address] Phone: [phone]   Fax:  [phone]  Physical Therapy Treatment  Patient Details  Name: Melvin Turner MRN: [id number] Date of Birth: [birth year] Referring Provider (PT): Fuller Canada   Encounter Date: 11/05/2019  PT End of Session - 11/05/19 1517    Visit Number  9    Number of Visits  18    Date for PT Re-Evaluation  11/19/19    Authorization Type  [employer] G[employer] Member    Authorization Time Period  11/20-->11/19/19    Authorization - Visit Number  9    Authorization - Number of Visits  10    PT Start Time  1511   4' on bike, not included wiht charges   PT Stop Time  1553    PT Time Calculation (min)  42 min    Activity Tolerance  Patient tolerated treatment well;Patient limited by pain;Patient limited by fatigue;No increased pain    Behavior During Therapy  WFL for tasks assessed/performed       Past Medical History:  Diagnosis Date  . Arthritis   . BPH (benign prostatic hyperplasia)   . GERD (gastroesophageal reflux disease)   . Hypertension     Past Surgical History:  Procedure Laterality Date  . COLONOSCOPY    . KNEE ARTHROSCOPY Right 01/31/2015   Procedure: RIGHT KNEE ARTHROSCOPY, PARTIAL MEDIAL MENISECTOMY;  Surgeon: Darreld Mclean, MD;  Location: [facility];  Service: Orthopedics;  Laterality: Right;  . TONSILLECTOMY    . TOTAL KNEE ARTHROPLASTY Right 02/03/2018   Procedure: TOTAL KNEE ARTHROPLASTY;  Surgeon: Vickki Hearing, MD;  Location: [facility];  Service: Orthopedics;  Laterality: Right;  . TOTAL KNEE ARTHROPLASTY Left 10/05/2019   Procedure: LEFT TOTAL KNEE ARTHROPLASTY;  Surgeon: Vickki Hearing, MD;  Location: [facility];  Service: Orthopedics;  Laterality: Left;  Marland Kitchen VASECTOMY      There were no vitals filed for this visit.  Subjective Assessment - 11/05/19 1514    Subjective  Pt stated knee is feeling okay today.  Reports he received a call for possible RTW  on 11/15/19 due to his FMLA running out, requested an extension.    Pertinent History  OA, Rt TKR    Patient Stated Goals  less pain,better motion    Currently in Pain?  Yes    Pain Score  3     Pain Location  Knee    Pain Orientation  Left    Pain Descriptors / Indicators  Aching;Sore    Pain Type  Chronic pain    Pain Onset  In the past 7 days    Pain Frequency  Intermittent    Aggravating Factors   weight bearing    Pain Relieving Factors  icing    Effect of Pain on Daily Activities  limits         [facility] PT Assessment - 11/05/19 0001      Assessment   Medical Diagnosis  Lt TKR    Referring Provider (PT)  Fuller Canada    Onset Date/Surgical Date  12/04/18    Next MD Visit  11/24/19    Prior Therapy  acute       Precautions   Precautions  None                   [facility] Adult PT Treatment/Exercise - 11/05/19 0001      Exercises   Exercises  Knee/Hip  Knee/Hip Exercises: Stretches   Sports administrator  3 reps;30 seconds    Quad Stretch Limitations  prone quad set    Knee: Self-Stretch to increase Flexion  10 seconds    Knee: Self-Stretch Limitations  knee drives on 23JS step. 10x    Gastroc Stretch  Both;3 reps;30 seconds      Knee/Hip Exercises: Aerobic   Stationary Bike  4' rocking seat 14- not included with charges      Knee/Hip Exercises: Standing   Lateral Step Up  Left;Hand Hold: 1;10 reps;Step Height: 4"    Functional Squat  10 reps    Stairs  Reciprocal pattern 4in, ascend 7in and descend 4in    SLS  Lt 7", Rt 21" max of 5    SLS with Vectors  Lt 5X5" with 1 HHA    Other Standing Knee Exercises  Power tower 2x 10 28 degrees      Knee/Hip Exercises: Supine   Heel Slides  5 reps    Knee Extension  AROM    Knee Extension Limitations  4    Knee Flexion  AROM    Knee Flexion Limitations  104      Manual Therapy   Manual Therapy  Edema management;Joint mobilization    Manual therapy comments  completed seperate from other skilled interventions     Edema Management  retro massage to LT LE to decrease swelling improve pain.    Joint Mobilization  patellar mobilizations grade II for pain relief and improve mobility superior/inferior, left/right    Soft tissue mobilization  STM to distal quad and medial hamstrings                PT Short Term Goals - 10/19/19 1338      PT SHORT TERM GOAL #1   Title  PT pain to be no greater than a 5/10 to allow pt to sleep for 3 hours at a time    Time  3    Period  Weeks    Status  On-going    Target Date  10/29/19      PT SHORT TERM GOAL #2   Title  Pt knee extension to be less than 3 degrees to allow pt to have a more normalized gt.    Time  3    Period  Weeks    Status  On-going      PT SHORT TERM GOAL #3   Title  PT knee flexion to be at least 95 to allow pt to sit for 40 minutes in comfort for traveling/watching tv.    Time  3    Period  Weeks    Status  On-going        PT Long Term Goals - 10/19/19 1339      PT LONG TERM GOAL #1   Title  Pt Lt knee pain to be no greater than a 2/10 to allow pt to sleep for six hours.    Time  6    Period  Weeks    Status  On-going      PT LONG TERM GOAL #2   Title  PT Lt knee flexion to be to 120 to allow pt to squat down to pick items of the floor and work in the yard.    Time  6    Period  Weeks    Status  On-going      PT LONG TERM GOAL #3   Title  Pt LE strength to  be increased one grade to allow pt to be ambulating without an assistive device.    Time  6    Period  Weeks    Status  On-going      PT LONG TERM GOAL #4   Title  Pt to be ambulating without an assistive device at least 350 ft in three minutes to demonstrate comnmunity ambulator.    Time  6    Period  Weeks    Status  On-going            Plan - 11/05/19 1556    Clinical Impression Statement  Pt progressing well toward POC.  Added quad stretch and began squats on power tower to assist with flexion.  Progressed functional strengthening with addition  of stair training following reports of earlier RTW and 4 flights of stairs to complete.  Pt able to ascend with good control, noted increased unsteadiness and decreased control while descending stairs due to eccentric control weakness.  Improved AROM 4-104 degrees (was 4-96 degrees last session.)  Pt reports increased pain with knee flexion, resolved when at rest.    Personal Factors and Comorbidities  Comorbidity 1    Examination-Activity Limitations  Bathing;Carry;Bed Mobility;Dressing;Lift;Locomotion Level;Sit;Sleep;Squat;Stairs;Stand;Transfers    Examination-Participation Restrictions  Cleaning;Community Activity;Driving;Laundry;Shop;Meal Prep;Yard Work    Stability/Clinical Decision Making  Stable/Uncomplicated    Clinical Decision Making  Low    Rehab Potential  Good    PT Frequency  3x / week    PT Duration  6 weeks    PT Treatment/Interventions  Passive range of motion;Manual techniques;Patient/family education;Therapeutic activities;Therapeutic exercise;Balance training;Stair training;Gait training;Cryotherapy;Electrical Stimulation    PT Next Visit Plan  continue to focus on ROM, gait with LRAD and reducing scar tissue/adhesions.    PT Home Exercise Plan  LAQ< heelslides both sitting and supine, quad set. 12/11 deep breathing, log rolling       Patient will benefit from skilled therapeutic intervention in order to improve the following deficits and impairments:  Abnormal gait, Decreased activity tolerance, Decreased balance, Decreased strength, Difficulty walking, Decreased range of motion, Increased edema, Pain  Visit Diagnosis: Acute pain of left knee  Other abnormalities of gait and mobility  Stiffness of left knee, not elsewhere classified  Muscle weakness (generalized)     Problem List Patient Active Problem List   Diagnosis Date Noted  . S/P total knee replacement, left 10/05/2019 10/19/2019  . Cellulitis 10/08/2019  . Acute knee pain 10/08/2019  . Primary  osteoarthritis of left knee   . S/P total knee replacement, right 02/03/18    Becky Saxasey Melvin Turner, LPTA; CBIS [phone]  Juel BurrowCockerham, Melvin Lill Jo 11/05/2019, 4:01 PM  [facility] [address] [place], [place], [address] Phone: [phone]   Fax:  [phone]  Name: Melvin Turner MRN: [id number] Date of Birth: January 07, 1956

## 2019-11-08 ENCOUNTER — Telehealth (HOSPITAL_COMMUNITY): Payer: Self-pay | Admitting: Physical Therapy

## 2019-11-08 ENCOUNTER — Other Ambulatory Visit: Payer: Self-pay

## 2019-11-08 ENCOUNTER — Ambulatory Visit (HOSPITAL_COMMUNITY): Payer: No Typology Code available for payment source | Admitting: Physical Therapy

## 2019-11-08 ENCOUNTER — Encounter (HOSPITAL_COMMUNITY): Payer: Self-pay | Admitting: Physical Therapy

## 2019-11-08 DIAGNOSIS — M25562 Pain in left knee: Secondary | ICD-10-CM | POA: Diagnosis not present

## 2019-11-08 DIAGNOSIS — R2689 Other abnormalities of gait and mobility: Secondary | ICD-10-CM

## 2019-11-08 DIAGNOSIS — M25662 Stiffness of left knee, not elsewhere classified: Secondary | ICD-10-CM

## 2019-11-08 DIAGNOSIS — M6281 Muscle weakness (generalized): Secondary | ICD-10-CM

## 2019-11-08 NOTE — Telephone Encounter (Signed)
pt cancelled 12/23 appt because he has a funeral to attend

## 2019-11-08 NOTE — Therapy (Addendum)
[facility] [address] [place], [place], [address] Phone: [phone]   Fax:  [phone]  Physical Therapy Treatment  Patient Details  Name: Melvin Turner MRN: [id number] Date of Birth: 01/14/1956 Referring Provider (PT): Arther Abbott  Progress Note Reporting Period 11/20 /2020  to 11/08/2019  See note below for Objective Data and Assessment of Progress/Goals.      ROM 4-105 Encounter Date: 11/08/2019  PT End of Session - 11/08/19 1211    Visit Number  10    Number of Visits  18    Date for PT Re-Evaluation  11/19/19    Authorization Type  [employer] GPA Member    Authorization Time Period  11/20-->11/19/19    Authorization - Visit Number  1    Authorization - Number of Visits  10    PT Start Time  0998    PT Stop Time  1200    PT Time Calculation (min)  43 min    Activity Tolerance  Patient tolerated treatment well;Patient limited by pain;Patient limited by fatigue;No increased pain    Behavior During Therapy  WFL for tasks assessed/performed       Past Medical History:  Diagnosis Date  . Arthritis   . BPH (benign prostatic hyperplasia)   . GERD (gastroesophageal reflux disease)   . Hypertension     Past Surgical History:  Procedure Laterality Date  . COLONOSCOPY    . KNEE ARTHROSCOPY Right 01/31/2015   Procedure: RIGHT KNEE ARTHROSCOPY, PARTIAL MEDIAL MENISECTOMY;  Surgeon: Sanjuana Kava, MD;  Location: [facility];  Service: Orthopedics;  Laterality: Right;  . TONSILLECTOMY    . TOTAL KNEE ARTHROPLASTY Right 02/03/2018   Procedure: TOTAL KNEE ARTHROPLASTY;  Surgeon: Carole Civil, MD;  Location: [facility];  Service: Orthopedics;  Laterality: Right;  . TOTAL KNEE ARTHROPLASTY Left 10/05/2019   Procedure: LEFT TOTAL KNEE ARTHROPLASTY;  Surgeon: Carole Civil, MD;  Location: [facility];  Service: Orthopedics;  Laterality: Left;  Marland Kitchen VASECTOMY      There were no vitals filed for this visit.  Subjective Assessment -  11/08/19 1112    Subjective  Pt states that he is not really doing the exercises that we gave him because he is walking all day.    Pertinent History  OA, Rt TKR    Patient Stated Goals  less pain,better motion    Currently in Pain?  Yes    Pain Score  2     Pain Location  Knee    Pain Orientation  Left    Pain Descriptors / Indicators  Tightness    Pain Onset  1 to 4 weeks ago    Pain Frequency  Intermittent    Aggravating Factors   walking a lot    Pain Relieving Factors  ice    Effect of Pain on Daily Activities  limits activity         [facility] PT Assessment - 11/08/19 0001      Assessment   Medical Diagnosis  Lt TKR    Referring Provider (PT)  Arther Abbott    Onset Date/Surgical Date  10/05/19    Next MD Visit  10/20/2019    Prior Therapy  acute       Precautions   Precautions  None      Restrictions   Weight Bearing Restrictions  No      Home Environment   Living Environment  Private residence    Available Help at Discharge  Family    Type of [place] to enter    Entrance Stairs-Number of Steps  2    Entrance Stairs-Rails  None    Home Layout  One level    Home Equipment  Walker - 2 wheels;Cane - single point      Cognition   Overall Cognitive Status  Within Functional Limits for tasks assessed      Observation/Other Assessments   Focus on Therapeutic Outcomes (FOTO)   --   not working please do on second visit.      Functional Tests   Functional tests  Single leg stance;Sit to Stand   RT:  22" . Lt: 2"      Sit to Stand   Comments  12 in 30 "       AROM   Left Knee Extension  -4   was -8   Left Knee Flexion  105   was 77      Strength   Left Hip Flexion  5/5   was 2/5    Left Hip ABduction  5/5   was 3/5   Left Knee Flexion  4+/5   was 2-/5    Left Knee Extension  4/5   was 3- now 4/5    Left Ankle Dorsiflexion  4-/5    Left Ankle Plantar Flexion  3-/5      Ambulation/Gait   Ambulation Distance (Feet)  339  Feet    Gait Comments  2 minute walk test                    [facility] Adult PT Treatment/Exercise - 11/08/19 0001      Ambulation/Gait   Assistive device  Straight cane      Exercises   Exercises  Knee/Hip      Knee/Hip Exercises: Standing   Heel Raises  Both;5 reps    Heel Raises Limitations  up on both lift Rt leg down on left     Terminal Knee Extension  Left;10 reps    Lateral Step Up  Left;10 reps;Hand Hold: 1;Step Height: 4"    Forward Step Up  Left;10 reps;Hand Hold: 1;Step Height: 6"    Functional Squat  10 reps    SLS  Lt 2", Rt 21" max of 5    Other Standing Knee Exercises  Power tower 20 34degrees      Knee/Hip Exercises: Supine   Quad Sets  10 reps    Knee Extension  AROM    Knee Extension Limitations  3    Knee Flexion  AROM               PT Short Term Goals - 11/08/19 1214      PT SHORT TERM GOAL #1   Title  PT pain to be no greater than a 5/10 to allow pt to sleep for 3 hours at a time    Time  3    Period  Weeks    Status  Achieved    Target Date  10/29/19      PT SHORT TERM GOAL #2   Title  Pt knee extension to be less than 3 degrees to allow pt to have a more normalized gt.    Time  3    Period  Weeks    Status  On-going      PT SHORT TERM GOAL #3   Title  PT knee flexion to be  at least 95 to allow pt to sit for 40 minutes in comfort for traveling/watching tv.    Time  3    Period  Weeks    Status  Achieved        PT Long Term Goals - 11/08/19 1215      PT LONG TERM GOAL #1   Title  Pt Lt knee pain to be no greater than a 2/10 to allow pt to sleep for six hours.    Time  6    Period  Weeks    Status  Partially Met   knee pain is down but pt does not sleep well yet.     PT LONG TERM GOAL #2   Title  PT Lt knee flexion to be to 120 to allow pt to squat down to pick items of the floor and work in the yard.    Time  6    Period  Weeks    Status  On-going      PT LONG TERM GOAL #3   Title  Pt LE strength to be  increased one grade to allow pt to be ambulating without an assistive device.    Time  6    Period  Weeks    Status  Partially Met      PT LONG TERM GOAL #4   Title  Pt to be ambulating without an assistive device at least 350 ft in three minutes to demonstrate comnmunity ambulator.    Time  6    Period  Weeks    Status  On-going            Plan - 11/08/19 1212    Clinical Impression Statement  Reassessment completed.  Pt continues to improve in ROM, strength is only significantly limited in ankle and hip extension at this time.  Pt ambulating in his home without his cane.  Pt will continue to benefit from skilled therapy to improve balance, mm strength as mentioned above and ROM.    Personal Factors and Comorbidities  Comorbidity 1    Examination-Activity Limitations  Bathing;Carry;Bed Mobility;Dressing;Lift;Locomotion Level;Sit;Sleep;Squat;Stairs;Stand;Transfers    Examination-Participation Restrictions  Cleaning;Community Activity;Driving;Laundry;Shop;Meal Prep;Yard Work    Stability/Clinical Decision Making  Stable/Uncomplicated    Rehab Potential  Good    PT Frequency  3x / week    PT Duration  6 weeks    PT Treatment/Interventions  Passive range of motion;Manual techniques;Patient/family education;Therapeutic activities;Therapeutic exercise;Balance training;Stair training;Gait training;Cryotherapy;Electrical Stimulation    PT Next Visit Plan  continue to focus on ROM, gait with LRAD and reducing scar tissue/adhesions.    PT Home Exercise Plan  LAQ< heelslides both sitting and supine, quad set. 12/11 deep breathing, log rolling       Patient will benefit from skilled therapeutic intervention in order to improve the following deficits and impairments:  Abnormal gait, Decreased activity tolerance, Decreased balance, Decreased strength, Difficulty walking, Decreased range of motion, Increased edema, Pain  Visit Diagnosis: Acute pain of left knee  Other abnormalities of gait  and mobility  Stiffness of left knee, not elsewhere classified  Muscle weakness (generalized)     Problem List Patient Active Problem List   Diagnosis Date Noted  . S/P total knee replacement, left 10/05/2019 10/19/2019  . Cellulitis 10/08/2019  . Acute knee pain 10/08/2019  . Primary osteoarthritis of left knee   . S/P total knee replacement, right 02/03/18     Rayetta Humphrey, PT CLT [phone] 11/08/2019, 12:16 PM  [facility] [address] [place], [place], [address] Phone: [phone]   Fax:  [phone]  Name: SHAQUAN MISSEY MRN: [id number] Date of Birth: 05-08-1956

## 2019-11-09 ENCOUNTER — Other Ambulatory Visit: Payer: Self-pay | Admitting: Orthopedic Surgery

## 2019-11-09 DIAGNOSIS — Z96652 Presence of left artificial knee joint: Secondary | ICD-10-CM

## 2019-11-09 MED ORDER — HYDROCODONE-ACETAMINOPHEN 7.5-325 MG PO TABS: 1.0000 | ORAL_TABLET | Freq: Three times a day (TID) | ORAL | 0 refills | Status: DC | PRN

## 2019-11-09 NOTE — Progress Notes (Signed)
Meds ordered this encounter  Medications  . HYDROcodone-acetaminophen (NORCO) 7.5-325 MG tablet    Sig: Take 1 tablet by mouth every 8 (eight) hours as needed for up to 7 days for moderate pain.    Dispense:  21 tablet    Refill:  0    Medication tapering

## 2019-11-09 NOTE — Telephone Encounter (Signed)
Changed dose

## 2019-11-09 NOTE — Telephone Encounter (Signed)
Patient requests refill on Hydrocodone/Acetaminophen 10-325  Mgs.  Qty  28  Sig: Take 1 tablet by mouth every 6 (six) hours as needed for up to 7 days.  Patient uses [facility] in [place]

## 2019-11-10 ENCOUNTER — Encounter (HOSPITAL_COMMUNITY): Payer: No Typology Code available for payment source | Admitting: Physical Therapy

## 2019-11-11 ENCOUNTER — Encounter (HOSPITAL_COMMUNITY): Payer: Self-pay | Admitting: Physical Therapy

## 2019-11-11 ENCOUNTER — Ambulatory Visit (HOSPITAL_COMMUNITY): Payer: No Typology Code available for payment source | Admitting: Physical Therapy

## 2019-11-11 ENCOUNTER — Encounter (HOSPITAL_COMMUNITY): Payer: No Typology Code available for payment source | Admitting: Physical Therapy

## 2019-11-11 ENCOUNTER — Other Ambulatory Visit: Payer: Self-pay

## 2019-11-11 DIAGNOSIS — M25562 Pain in left knee: Secondary | ICD-10-CM

## 2019-11-11 DIAGNOSIS — M25662 Stiffness of left knee, not elsewhere classified: Secondary | ICD-10-CM

## 2019-11-11 DIAGNOSIS — M6281 Muscle weakness (generalized): Secondary | ICD-10-CM

## 2019-11-11 DIAGNOSIS — R2689 Other abnormalities of gait and mobility: Secondary | ICD-10-CM

## 2019-11-11 NOTE — Therapy (Signed)
[place], [place], [address] Phone: [phone]   Fax:  [phone]  Physical Therapy Treatment  Patient Details  Name: Melvin Turner MRN: [id number] Date of Birth: Aug 08, 1956 Referring Provider (PT): Arther Abbott   Encounter Date: 11/11/2019  PT End of Session - 11/11/19 0940    Visit Number  11    Number of Visits  18    Date for PT Re-Evaluation  11/19/19    Authorization Type  [employer] G[employer] Member    Authorization Time Period  POC Dates 11/20-->11/19/19    Authorization - Visit Number  1    Authorization - Number of Visits  10    PT Start Time  1937    PT Stop Time  1043    PT Time Calculation (min)  48 min    Activity Tolerance  Patient tolerated treatment well;Patient limited by pain;Patient limited by fatigue;No increased pain    Behavior During Therapy  WFL for tasks assessed/performed       Past Medical History:  Diagnosis Date  . Arthritis   . BPH (benign prostatic hyperplasia)   . GERD (gastroesophageal reflux disease)   . Hypertension     Past Surgical History:  Procedure Laterality Date  . COLONOSCOPY    . KNEE ARTHROSCOPY Right 01/31/2015   Procedure: RIGHT KNEE ARTHROSCOPY, PARTIAL MEDIAL MENISECTOMY;  Surgeon: Sanjuana Kava, MD;  Location: [facility];  Service: Orthopedics;  Laterality: Right;  . TONSILLECTOMY    . TOTAL KNEE ARTHROPLASTY Right 02/03/2018   Procedure: TOTAL KNEE ARTHROPLASTY;  Surgeon: Carole Civil, MD;  Location: [facility];  Service: Orthopedics;  Laterality: Right;  . TOTAL KNEE ARTHROPLASTY Left 10/05/2019   Procedure: LEFT TOTAL KNEE ARTHROPLASTY;  Surgeon: Carole Civil, MD;  Location: [facility];  Service: Orthopedics;  Laterality: Left;  Marland Kitchen VASECTOMY      There were no vitals filed for this visit.  Subjective Assessment - 11/11/19 0940    Subjective  States he feels good today. States he is sleeping better and able to lay on his side now.    Pertinent History   OA, Rt TKR    Patient Stated Goals  less pain,better motion    Currently in Pain?  Yes    Pain Location  Knee    Pain Orientation  Left    Pain Descriptors / Indicators  Tightness    Pain Onset  1 to 4 weeks ago         [facility] PT Assessment - 11/11/19 0001      Assessment   Medical Diagnosis  Lt TKR    Referring Provider (PT)  Arther Abbott    Onset Date/Surgical Date  10/05/19    Prior Therapy  acute       Precautions   Precautions  None                   [facility] Adult PT Treatment/Exercise - 11/11/19 0001      Knee/Hip Exercises: Stretches   Other Knee/Hip Stretches  seated knee flexion x5 20" holds L       Knee/Hip Exercises: Standing   Heel Raises  4 sets;5 reps   double leg, holds for 5 seconds, slow lower   Terminal Knee Extension  Left;AROM;Strengthening;2 sets   x12, 5" holds at wall with ball    Step Down  3 sets;Step Height: 6";Hand Hold: 2;Left    8 reps - stepping down with right, cues  to bend knee   Other Standing Knee Exercises  chair pose x5, 15" holds    Other Standing Knee Exercises  knee flexion and extension stretch on12" step x5, 20" holds L       Modalities   Modalities  Cryotherapy      Cryotherapy   Number Minutes Cryotherapy  8 Minutes   left knee            PT Education - 11/11/19 1033    Education Details  educated patietn on not favoring his right leg. Discussed standing in wider stance throughout the day to not stick out right hip.    Person(s) Educated  Patient    Methods  Explanation    Comprehension  Verbalized understanding       PT Short Term Goals - 11/08/19 1214      PT SHORT TERM GOAL #1   Title  PT pain to be no greater than a 5/10 to allow pt to sleep for 3 hours at a time    Time  3    Period  Weeks    Status  Achieved    Target Date  10/29/19      PT SHORT TERM GOAL #2   Title  Pt knee extension to be less than 3 degrees to allow pt to have a more normalized gt.    Time  3    Period  Weeks     Status  On-going      PT SHORT TERM GOAL #3   Title  PT knee flexion to be at least 95 to allow pt to sit for 40 minutes in comfort for traveling/watching tv.    Time  3    Period  Weeks    Status  Achieved        PT Long Term Goals - 11/08/19 1215      PT LONG TERM GOAL #1   Title  Pt Lt knee pain to be no greater than a 2/10 to allow pt to sleep for six hours.    Time  6    Period  Weeks    Status  Partially Met   knee pain is down but pt does not sleep well yet.     PT LONG TERM GOAL #2   Title  PT Lt knee flexion to be to 120 to allow pt to squat down to pick items of the floor and work in the yard.    Time  6    Period  Weeks    Status  On-going      PT LONG TERM GOAL #3   Title  Pt LE strength to be increased one grade to allow pt to be ambulating without an assistive device.    Time  6    Period  Weeks    Status  Partially Met      PT LONG TERM GOAL #4   Title  Pt to be ambulating without an assistive device at least 350 ft in three minutes to demonstrate comnmunity ambulator.    Time  6    Period  Weeks    Status  On-going            Plan - 11/11/19 1009    Clinical Impression Statement  Focused on standing strength today. Tolerated very well today. Improved demeanor and energy noted during today's session. Added eccentric step downs and these were tolerated well. Fatigue noted end of session with minor discomfort in left knee.  Patient would continue benefit from skilled physical therapy to improve functional strength and ROM.    Personal Factors and Comorbidities  Comorbidity 1    Examination-Activity Limitations  Bathing;Carry;Bed Mobility;Dressing;Lift;Locomotion Level;Sit;Sleep;Squat;Stairs;Stand;Transfers    Examination-Participation Restrictions  Cleaning;Community Activity;Driving;Laundry;Shop;Meal Prep;Yard Work    Stability/Clinical Decision Making  Stable/Uncomplicated    Rehab Potential  Good    PT Frequency  3x / week    PT Duration  6 weeks     PT Treatment/Interventions  Passive range of motion;Manual techniques;Patient/family education;Therapeutic activities;Therapeutic exercise;Balance training;Stair training;Gait training;Cryotherapy;Electrical Stimulation    PT Next Visit Plan  continue to focus on ROM, gait with LRAD and reducing scar tissue/adhesions.    PT Home Exercise Plan  LAQ< heelslides both sitting and supine, quad set. 12/11 deep breathing, log rolling       Patient will benefit from skilled therapeutic intervention in order to improve the following deficits and impairments:  Abnormal gait, Decreased activity tolerance, Decreased balance, Decreased strength, Difficulty walking, Decreased range of motion, Increased edema, Pain  Visit Diagnosis: Acute pain of left knee  Other abnormalities of gait and mobility  Stiffness of left knee, not elsewhere classified  Muscle weakness (generalized)     Problem List Patient Active Problem List   Diagnosis Date Noted  . S/P total knee replacement, left 10/05/2019 10/19/2019  . Cellulitis 10/08/2019  . Acute knee pain 10/08/2019  . Primary osteoarthritis of left knee   . S/P total knee replacement, right 02/03/18    10:35 AM, 11/11/19 Jerene Pitch, DPT Physical Therapy with [facility]  [phone] office  [place] [address] [place], [place], [address] Phone: [phone]   Fax:  [phone]  Name: JACEION ADAY MRN: [id number] Date of Birth: 12/24/1955

## 2019-11-15 ENCOUNTER — Other Ambulatory Visit: Payer: Self-pay | Admitting: Orthopedic Surgery

## 2019-11-15 ENCOUNTER — Ambulatory Visit (HOSPITAL_COMMUNITY): Payer: No Typology Code available for payment source | Admitting: Physical Therapy

## 2019-11-15 ENCOUNTER — Other Ambulatory Visit: Payer: Self-pay

## 2019-11-15 ENCOUNTER — Encounter (HOSPITAL_COMMUNITY): Payer: Self-pay | Admitting: Physical Therapy

## 2019-11-15 DIAGNOSIS — R2689 Other abnormalities of gait and mobility: Secondary | ICD-10-CM

## 2019-11-15 DIAGNOSIS — M25662 Stiffness of left knee, not elsewhere classified: Secondary | ICD-10-CM

## 2019-11-15 DIAGNOSIS — M25562 Pain in left knee: Secondary | ICD-10-CM

## 2019-11-15 DIAGNOSIS — Z96652 Presence of left artificial knee joint: Secondary | ICD-10-CM

## 2019-11-15 DIAGNOSIS — M6281 Muscle weakness (generalized): Secondary | ICD-10-CM

## 2019-11-15 MED ORDER — HYDROCODONE-ACETAMINOPHEN 7.5-325 MG PO TABS: 1.0000 | ORAL_TABLET | Freq: Three times a day (TID) | ORAL | 0 refills | Status: DC | PRN

## 2019-11-15 NOTE — Therapy (Signed)
[place], [place], [address] Phone: [phone]   Fax:  [phone]  Physical Therapy Treatment  Patient Details  Name: Melvin Turner MRN: [id number] Date of Birth: 1956/03/28 Referring Provider (PT): Arther Abbott   Encounter Date: 11/15/2019  PT End of Session - 11/15/19 1524    Visit Number  12    Number of Visits  18    Date for PT Re-Evaluation  11/19/19    Authorization Type  [employer]CS GPA Member    Authorization Time Period  POC Dates 11/20-->11/19/19    Authorization - Visit Number  2    Authorization - Number of Visits  10    PT Start Time  1520    PT Stop Time  1605    PT Time Calculation (min)  45 min    Activity Tolerance  Patient tolerated treatment well;No increased pain    Behavior During Therapy  WFL for tasks assessed/performed       Past Medical History:  Diagnosis Date  . Arthritis   . BPH (benign prostatic hyperplasia)   . GERD (gastroesophageal reflux disease)   . Hypertension     Past Surgical History:  Procedure Laterality Date  . COLONOSCOPY    . KNEE ARTHROSCOPY Right 01/31/2015   Procedure: RIGHT KNEE ARTHROSCOPY, PARTIAL MEDIAL MENISECTOMY;  Surgeon: Sanjuana Kava, MD;  Location: [facility];  Service: Orthopedics;  Laterality: Right;  . TONSILLECTOMY    . TOTAL KNEE ARTHROPLASTY Right 02/03/2018   Procedure: TOTAL KNEE ARTHROPLASTY;  Surgeon: Carole Civil, MD;  Location: [facility];  Service: Orthopedics;  Laterality: Right;  . TOTAL KNEE ARTHROPLASTY Left 10/05/2019   Procedure: LEFT TOTAL KNEE ARTHROPLASTY;  Surgeon: Carole Civil, MD;  Location: [facility];  Service: Orthopedics;  Laterality: Left;  Marland Kitchen VASECTOMY      There were no vitals filed for this visit.  Subjective Assessment - 11/15/19 1523    Subjective  Patient says knee is coming along. Patient says he has been walking some without his cane. Reports mild pain this afternoon, about 3/10.    Pertinent History  OA,  Rt TKR    Patient Stated Goals  less pain,better motion    Currently in Pain?  Yes    Pain Score  3     Pain Location  Knee    Pain Orientation  Left;Anterior;Lateral    Pain Descriptors / Indicators  Aching    Pain Type  Surgical pain    Pain Onset  More than a month ago    Pain Frequency  Intermittent                       [facility] Adult PT Treatment/Exercise - 11/15/19 0001      Knee/Hip Exercises: Stretches   Passive Hamstring Stretch  Left;3 reps;30 seconds    Passive Hamstring Stretch Limitations  seated    Knee: Self-Stretch to increase Flexion  10 seconds    Knee: Self-Stretch Limitations  knee drives on 51GF step. 10x    Gastroc Stretch  Both;3 reps;30 seconds    Gastroc Stretch Limitations  slant board 30" x 3       Knee/Hip Exercises: Aerobic   Recumbent Bike  4 min warmup, rocking, for ROM      Knee/Hip Exercises: Standing   Heel Raises  15 reps;Both    Terminal Knee Extension  Left;15 reps    Theraband Level (Terminal Knee Extension)  Other (  comment)    Forward Step Up  Left;2 sets;10 reps;Hand Hold: 2;Step Height: 6"    Step Down  Left;2 sets;10 reps;Hand Hold: 2;Step Height: 4"    Functional Squat  2 sets;10 reps    Functional Squat Limitations  to chair for cueing    SLS  3 x 10"    Gait Training  450' no AD    Other Standing Knee Exercises  tandem stance, 3 x 20", solid surface      Knee/Hip Exercises: Supine   Knee Extension  AROM    Knee Extension Limitations  6    Knee Flexion  AROM    Knee Flexion Limitations  100      Manual Therapy   Manual Therapy  Passive ROM    Manual therapy comments  completed seperate from other skilled interventions    Passive ROM  PROM knee ext, 3 x10"; knee flexion PROM on foam roll, 5 x 10"               PT Short Term Goals - 11/08/19 1214      PT SHORT TERM GOAL #1   Title  PT pain to be no greater than a 5/10 to allow pt to sleep for 3 hours at a time    Time  3    Period  Weeks    Status   Achieved    Target Date  10/29/19      PT SHORT TERM GOAL #2   Title  Pt knee extension to be less than 3 degrees to allow pt to have a more normalized gt.    Time  3    Period  Weeks    Status  On-going      PT SHORT TERM GOAL #3   Title  PT knee flexion to be at least 95 to allow pt to sit for 40 minutes in comfort for traveling/watching tv.    Time  3    Period  Weeks    Status  Achieved        PT Long Term Goals - 11/08/19 1215      PT LONG TERM GOAL #1   Title  Pt Lt knee pain to be no greater than a 2/10 to allow pt to sleep for six hours.    Time  6    Period  Weeks    Status  Partially Met   knee pain is down but pt does not sleep well yet.     PT LONG TERM GOAL #2   Title  PT Lt knee flexion to be to 120 to allow pt to squat down to pick items of the floor and work in the yard.    Time  6    Period  Weeks    Status  On-going      PT LONG TERM GOAL #3   Title  Pt LE strength to be increased one grade to allow pt to be ambulating without an assistive device.    Time  6    Period  Weeks    Status  Partially Met      PT LONG TERM GOAL #4   Title  Pt to be ambulating without an assistive device at least 350 ft in three minutes to demonstrate comnmunity ambulator.    Time  6    Period  Weeks    Status  On-going            Plan - 11/15/19 1629  Clinical Impression Statement  Patient tolerated session well today. Patient able to progress reps with step ups and step downs. Patient had difficulty with step downs from attempted 6 inch box due to ROM restriction and quad weakness. Exercise modified to 4 inch box. Patient demos good carry over of form and sequencing with gait training and was able to progress distance to 450 feet with no AD. Patient educated on frequency of HEP stretching to continue to improve LT knee AROM.    Personal Factors and Comorbidities  Comorbidity 1    Examination-Activity Limitations  Bathing;Carry;Bed Mobility;Dressing;Lift;Locomotion  Level;Sit;Sleep;Squat;Stairs;Stand;Transfers    Examination-Participation Restrictions  Cleaning;Community Activity;Driving;Laundry;Shop;Meal Prep;Yard Work    Stability/Clinical Decision Making  Stable/Uncomplicated    Rehab Potential  Good    PT Frequency  3x / week    PT Duration  6 weeks    PT Treatment/Interventions  Passive range of motion;Manual techniques;Patient/family education;Therapeutic activities;Therapeutic exercise;Balance training;Stair training;Gait training;Cryotherapy;Electrical Stimulation    PT Next Visit Plan  continue to focus on ROM, gait with LRAD and reducing scar tissue/adhesions. Attempt step down from 6 inch box next visit, add foam to tandem stance    PT Home Exercise Plan  LAQ< heelslides both sitting and supine, quad set. 12/11 deep breathing, log rolling       Patient will benefit from skilled therapeutic intervention in order to improve the following deficits and impairments:  Abnormal gait, Decreased activity tolerance, Decreased balance, Decreased strength, Difficulty walking, Decreased range of motion, Increased edema, Pain  Visit Diagnosis: Acute pain of left knee  Stiffness of left knee, not elsewhere classified  Muscle weakness (generalized)  Other abnormalities of gait and mobility     Problem List Patient Active Problem List   Diagnosis Date Noted  . S/P total knee replacement, left 10/05/2019 10/19/2019  . Cellulitis 10/08/2019  . Acute knee pain 10/08/2019  . Primary osteoarthritis of left knee   . S/P total knee replacement, right 02/03/18    4:35 PM, 11/15/19 Josue Hector PT DPT  Physical Therapist with [facility]  [phone] [place] [address] [place], [place], [address] Phone: [phone]   Fax:  [phone]  Name: SANTHOSH GULINO MRN: [id number] Date of Birth: 29-Jan-1956

## 2019-11-15 NOTE — Telephone Encounter (Signed)
Patient called regarding refill request - said called [facility], 12/22-12/23/20, and told not received HYDROcodone-acetaminophen (NORCO) 7.5-325 MG tablet 21 tablet

## 2019-11-17 ENCOUNTER — Encounter (HOSPITAL_COMMUNITY): Payer: Self-pay

## 2019-11-17 ENCOUNTER — Ambulatory Visit (HOSPITAL_COMMUNITY): Payer: No Typology Code available for payment source

## 2019-11-17 ENCOUNTER — Other Ambulatory Visit: Payer: Self-pay

## 2019-11-17 DIAGNOSIS — M25662 Stiffness of left knee, not elsewhere classified: Secondary | ICD-10-CM

## 2019-11-17 DIAGNOSIS — M6281 Muscle weakness (generalized): Secondary | ICD-10-CM

## 2019-11-17 DIAGNOSIS — M25562 Pain in left knee: Secondary | ICD-10-CM | POA: Diagnosis not present

## 2019-11-17 DIAGNOSIS — R2689 Other abnormalities of gait and mobility: Secondary | ICD-10-CM

## 2019-11-17 NOTE — Therapy (Signed)
[place], [place], [address] Phone: [phone]   Fax:  [phone]  Physical Therapy Treatment  Patient Details  Name: Melvin Turner MRN: [id number] Date of Birth: February 21, 1956 Referring Provider (PT): Arther Abbott   Encounter Date: 11/17/2019  PT End of Session - 11/17/19 1054    Visit Number  13    Number of Visits  18    Date for PT Re-Evaluation  11/19/19    Authorization Type  PHCS GPA Member    Authorization Time Period  POC Dates 11/20-->11/19/19    Authorization - Visit Number  3    Authorization - Number of Visits  10    PT Start Time  5625   6' on bike. not included wiht charges   PT Stop Time  1128    PT Time Calculation (min)  43 min    Activity Tolerance  Patient tolerated treatment well;No increased pain    Behavior During Therapy  WFL for tasks assessed/performed       Past Medical History:  Diagnosis Date  . Arthritis   . BPH (benign prostatic hyperplasia)   . GERD (gastroesophageal reflux disease)   . Hypertension     Past Surgical History:  Procedure Laterality Date  . COLONOSCOPY    . KNEE ARTHROSCOPY Right 01/31/2015   Procedure: RIGHT KNEE ARTHROSCOPY, PARTIAL MEDIAL MENISECTOMY;  Surgeon: Sanjuana Kava, MD;  Location: [facility];  Service: Orthopedics;  Laterality: Right;  . TONSILLECTOMY    . TOTAL KNEE ARTHROPLASTY Right 02/03/2018   Procedure: TOTAL KNEE ARTHROPLASTY;  Surgeon: Carole Civil, MD;  Location: [facility];  Service: Orthopedics;  Laterality: Right;  . TOTAL KNEE ARTHROPLASTY Left 10/05/2019   Procedure: LEFT TOTAL KNEE ARTHROPLASTY;  Surgeon: Carole Civil, MD;  Location: [facility];  Service: Orthopedics;  Laterality: Left;  Marland Kitchen VASECTOMY      There were no vitals filed for this visit.  Subjective Assessment - 11/17/19 1051    Subjective  Pt reports increased pain this morning, has been on feet a lot.  Current pain scale 3-4/10.    Pertinent History  OA, Rt  TKR    Patient Stated Goals  less pain,better motion    Currently in Pain?  No/denies    Pain Score  4     Pain Location  Knee    Pain Orientation  Left;Anterior;Lateral    Pain Descriptors / Indicators  Aching;Tightness    Pain Onset  More than a month ago    Pain Frequency  Intermittent    Aggravating Factors   walking a lot    Pain Relieving Factors  ice    Effect of Pain on Daily Activities  limits activity                       [facility] Adult PT Treatment/Exercise - 11/17/19 0001      Knee/Hip Exercises: Stretches   Active Hamstring Stretch  3 reps;30 seconds    Quad Stretch  2 reps;30 seconds    Quad Stretch Limitations  Thomas stretch with therapist passive ROM    Knee: Self-Stretch to increase Flexion  10 seconds    Knee: Self-Stretch Limitations  knee drives on 38LH step. 10x    Gastroc Stretch  Both;3 reps;30 seconds    Gastroc Stretch Limitations  slant board 30" x 3       Knee/Hip Exercises: Aerobic   Recumbent Bike  4' on bike,  full revolution after assistance from therapist.  Seat 16      Knee/Hip Exercises: Standing   Heel Raises  15 reps;Both    Heel Raises Limitations  squat then heel raise    Terminal Knee Extension  Left;5 reps    Theraband Level (Terminal Knee Extension)  Other (comment)   Purple band   Terminal Knee Extension Limitations  Retro gait (toe to heel for extension) forward heel to toe    Lateral Step Up  Left;10 reps;Hand Hold: 2;Step Height: 4";5 reps;Step Height: 6"    Lateral Step Up Limitations  cueing to reduce Rt LE hopping    Forward Step Up  Left;15 reps;Hand Hold: 1;Step Height: 6"    Step Down  Left;Hand Hold: 1;Step Height: 6";2 sets;5 reps    Step Down Limitations  eccentric control    Functional Squat  2 sets;10 reps    Functional Squat Limitations  to chair for cueing    SLS with Vectors  Lt 5X5" with 1 HHA    Gait Training  450' no AD, cueing for heel strike      Knee/Hip Exercises: Supine   Knee Extension   AROM    Knee Extension Limitations  6    Knee Flexion  AROM    Knee Flexion Limitations  106               PT Short Term Goals - 11/08/19 1214      PT SHORT TERM GOAL #1   Title  PT pain to be no greater than a 5/10 to allow pt to sleep for 3 hours at a time    Time  3    Period  Weeks    Status  Achieved    Target Date  10/29/19      PT SHORT TERM GOAL #2   Title  Pt knee extension to be less than 3 degrees to allow pt to have a more normalized gt.    Time  3    Period  Weeks    Status  On-going      PT SHORT TERM GOAL #3   Title  PT knee flexion to be at least 95 to allow pt to sit for 40 minutes in comfort for traveling/watching tv.    Time  3    Period  Weeks    Status  Achieved        PT Long Term Goals - 11/08/19 1215      PT LONG TERM GOAL #1   Title  Pt Lt knee pain to be no greater than a 2/10 to allow pt to sleep for six hours.    Time  6    Period  Weeks    Status  Partially Met   knee pain is down but pt does not sleep well yet.     PT LONG TERM GOAL #2   Title  PT Lt knee flexion to be to 120 to allow pt to squat down to pick items of the floor and work in the yard.    Time  6    Period  Weeks    Status  On-going      PT LONG TERM GOAL #3   Title  Pt LE strength to be increased one grade to allow pt to be ambulating without an assistive device.    Time  6    Period  Weeks    Status  Partially Met      PT  LONG TERM GOAL #4   Title  Pt to be ambulating without an assistive device at least 350 ft in three minutes to demonstrate comnmunity ambulator.    Time  6    Period  Weeks    Status  On-going            Plan - 11/17/19 1310    Clinical Impression Statement  Pt progressing well towards POC.  Continued session focus with knee mobility, gait training without AD and functional strengthening.  Pt able to make full revolution on bike with therapist AAROM and cueing for ankle mechanics on bike to improve knee flexion.  Pt able to  ambulate no AD with no LOB episodes, verbal cueing to improve heel contact and equal stride length.  Added retro gait with theraband for quad strengthening to improve TKE with gait.  Able to progress functional strengthening with increased height with step down, cueing for eccentric control and some difficulty due to quad weakness.  AROM at EOS 6-106 degrees (was 6-100 degrees last session.)    Personal Factors and Comorbidities  Comorbidity 1    Examination-Activity Limitations  Bathing;Carry;Bed Mobility;Dressing;Lift;Locomotion Level;Sit;Sleep;Squat;Stairs;Stand;Transfers    Examination-Participation Restrictions  Cleaning;Community Activity;Driving;Laundry;Shop;Meal Prep;Yard Work    Stability/Clinical Decision Making  Stable/Uncomplicated    Clinical Decision Making  Low    Rehab Potential  Good    PT Frequency  3x / week    PT Duration  6 weeks    PT Treatment/Interventions  Passive range of motion;Manual techniques;Patient/family education;Therapeutic activities;Therapeutic exercise;Balance training;Stair training;Gait training;Cryotherapy;Electrical Stimulation    PT Next Visit Plan  continue to focus on ROM, gait with LRAD and reducing scar tissue/adhesions. Attempt step down from 6 inch box next visit, add foam to tandem stance    PT Home Exercise Plan  LAQ< heelslides both sitting and supine, quad set. 12/11 deep breathing, log rolling       Patient will benefit from skilled therapeutic intervention in order to improve the following deficits and impairments:  Abnormal gait, Decreased activity tolerance, Decreased balance, Decreased strength, Difficulty walking, Decreased range of motion, Increased edema, Pain  Visit Diagnosis: Stiffness of left knee, not elsewhere classified  Muscle weakness (generalized)  Acute pain of left knee  Other abnormalities of gait and mobility     Problem List Patient Active Problem List   Diagnosis Date Noted  . S/P total knee replacement, left  10/05/2019 10/19/2019  . Cellulitis 10/08/2019  . Acute knee pain 10/08/2019  . Primary osteoarthritis of left knee   . S/P total knee replacement, right 02/03/18    Ihor Austin, LPTA; [place]  Aldona Lento 11/17/2019, 1:17 PM  [place], [place], [address] Phone: [phone]   Fax:  [phone]  Name: Melvin Turner MRN: [id number] Date of Birth: [birth year]

## 2019-11-18 ENCOUNTER — Encounter (HOSPITAL_COMMUNITY): Payer: Self-pay | Admitting: Physical Therapy

## 2019-11-18 ENCOUNTER — Ambulatory Visit (HOSPITAL_COMMUNITY): Payer: No Typology Code available for payment source | Admitting: Physical Therapy

## 2019-11-18 DIAGNOSIS — R2689 Other abnormalities of gait and mobility: Secondary | ICD-10-CM

## 2019-11-18 DIAGNOSIS — M25562 Pain in left knee: Secondary | ICD-10-CM

## 2019-11-18 DIAGNOSIS — M25662 Stiffness of left knee, not elsewhere classified: Secondary | ICD-10-CM

## 2019-11-18 DIAGNOSIS — M6281 Muscle weakness (generalized): Secondary | ICD-10-CM

## 2019-11-18 NOTE — Patient Instructions (Signed)
Access Code: [email or web address]  URL: [email or web address]  Date: 11/18/2019  Prepared by: Margie Billet   Exercises Squat with Chair Touch - 10 reps - 3 sets - 1x daily - 7x weekly

## 2019-11-18 NOTE — Therapy (Signed)
[facility] [address] [place], [place], [address] Phone: [phone]   Fax:  [phone]  Physical Therapy Treatment/Discharge Summary  Patient Details  Name: Melvin Turner MRN: [id number] Date of Birth: [birth year] Referring Provider (PT): Arther Abbott   Encounter Date: 11/18/2019  PHYSICAL THERAPY DISCHARGE SUMMARY  Visits from Start of Care: 14  Current functional level related to goals / functional outcomes: See below   Remaining deficits: See below   Education / Equipment: See below  Plan: Patient agrees to discharge.  Patient goals were partially met. Patient is being discharged due to being pleased with the current functional level.  ?????       PT End of Session - 11/18/19 1106    Visit Number  14    Number of Visits  18    Date for PT Re-Evaluation  11/19/19    Authorization Type  [employer] GPA Member    Authorization Time Period  POC Dates 11/20-->11/19/19    Authorization - Visit Number  4    Authorization - Number of Visits  10    PT Start Time  1020    PT Stop Time  1100    PT Time Calculation (min)  40 min    Activity Tolerance  Patient tolerated treatment well;No increased pain    Behavior During Therapy  WFL for tasks assessed/performed       Past Medical History:  Diagnosis Date  . Arthritis   . BPH (benign prostatic hyperplasia)   . GERD (gastroesophageal reflux disease)   . Hypertension     Past Surgical History:  Procedure Laterality Date  . COLONOSCOPY    . KNEE ARTHROSCOPY Right 01/31/2015   Procedure: RIGHT KNEE ARTHROSCOPY, PARTIAL MEDIAL MENISECTOMY;  Surgeon: Sanjuana Kava, MD;  Location: [facility];  Service: Orthopedics;  Laterality: Right;  . TONSILLECTOMY    . TOTAL KNEE ARTHROPLASTY Right 02/03/2018   Procedure: TOTAL KNEE ARTHROPLASTY;  Surgeon: Carole Civil, MD;  Location: [facility];  Service: Orthopedics;  Laterality: Right;  . TOTAL KNEE ARTHROPLASTY Left 10/05/2019   Procedure:  LEFT TOTAL KNEE ARTHROPLASTY;  Surgeon: Carole Civil, MD;  Location: [facility];  Service: Orthopedics;  Laterality: Left;  Marland Kitchen VASECTOMY      There were no vitals filed for this visit.  Subjective Assessment - 11/18/19 1019    Subjective  Patient states 75% improvement since beginning physical therapy but continues to be limited with strength and endurance with walking. He feels that he just has to do things slower now. He is hoping to get back to work soon. He feels that he is overall doing well and just needs to get back to his daily life to make further progress. He is very eager to get back to work because he knows that will help him most.    Pertinent History  OA, Rt TKR    How long can you stand comfortably?  20-30 minutes    How long can you walk comfortably?  20 minutes    Patient Stated Goals  less pain,better motion    Currently in Pain?  Yes    Pain Score  4    at worst   Pain Location  Knee    Pain Orientation  Left    Pain Onset  More than a month ago         [facility] PT Assessment - 11/18/19 0001      Assessment   Medical Diagnosis  Lt  TKR    Referring Provider (PT)  Arther Abbott    Onset Date/Surgical Date  10/05/19    Prior Therapy  acute       Precautions   Precautions  None      Restrictions   Weight Bearing Restrictions  No      [place] residence    Available Help at Discharge  Family    Type of [place] to enter    Entrance Stairs-Number of Steps  2    [place]  One level    [place] - 2 wheels;[place] - single point      Cognition   Overall Cognitive Status  Within Functional Limits for tasks assessed      Observation/Other Assessments   Focus on Therapeutic Outcomes (FOTO)   41% limited, was 45% limited on 11/08/19      AROM   Left Knee Extension  4   lacking 4   Left Knee Flexion  107      Strength   Right Hip Flexion  5/5     Left Hip Flexion  5/5    Left Knee Flexion  5/5    Left Knee Extension  5/5    Left Ankle Dorsiflexion  4-/5      Ambulation/Gait   Ambulation/Gait  Yes    Ambulation/Gait Assistance  7: Independent    Ambulation Distance (Feet)  575 Feet    Gait Pattern  --   limited knee flexion/extension ROM througout cycle   Ambulation Surface  Level;Indoor    Gait Comments  3 minute walk test                    [facility] Adult PT Treatment/Exercise - 11/18/19 0001      Knee/Hip Exercises: Standing   Forward Step Up  Left;15 reps;Hand Hold: 1;Step Height: 6"    Step Down  Left;Hand Hold: 1;Step Height: 6";2 sets;5 reps      Knee/Hip Exercises: Seated   Sit to Sand  2 sets;10 reps      Knee/Hip Exercises: Supine   Knee Extension  AROM    Knee Extension Limitations  4    Knee Flexion  AROM    Knee Flexion Limitations  107             PT Education - 11/18/19 1104    Education Details  Patient educated on HEP, reviewed HEP and mechanics with HEP. Discussed D/C vs. continuing therapy with patient.  Educated on beginning a walking program to improve endurance, knee positioning to maximize functional potential, returning to clinic should he want to continue physical therapy.    Person(s) Educated  Patient    Methods  Explanation;Handout;Demonstration    Comprehension  Verbalized understanding       PT Short Term Goals - 11/18/19 1108      PT SHORT TERM GOAL #1   Title  PT pain to be no greater than a 5/10 to allow pt to sleep for 3 hours at a time    Time  3    Period  Weeks    Status  Achieved    Target Date  10/29/19      PT SHORT TERM GOAL #2   Title  Pt knee extension to be less than 3 degrees to allow pt to have a more normalized gt.  Time  3    Period  Weeks    Status  Not Met      PT SHORT TERM GOAL #3   Title  PT knee flexion to be at least 95 to allow pt to sit for 40 minutes in comfort for traveling/watching tv.    Time  3    Period  Weeks    Status   Achieved        PT Long Term Goals - 11/18/19 1044      PT LONG TERM GOAL #1   Title  Pt Lt knee pain to be no greater than a 2/10 to allow pt to sleep for six hours.    Baseline  patient does not sleep well due to bladder, pain does not limit sleep anymore    Time  6    Period  Weeks    Status  Achieved   knee pain is down but pt does not sleep well yet.     PT LONG TERM GOAL #2   Title  PT Lt knee flexion to be to 120 to allow pt to squat down to pick items of the floor and work in the yard.    Time  6    Period  Weeks    Status  Not Met      PT LONG TERM GOAL #3   Title  Pt LE strength to be increased one grade to allow pt to be ambulating without an assistive device.    Time  6    Period  Weeks    Status  Achieved      PT LONG TERM GOAL #4   Title  Pt to be ambulating without an assistive device at least 350 ft in three minutes to demonstrate comnmunity ambulator.    Time  6    Period  Weeks    Status  Achieved            Plan - 11/18/19 1108    Clinical Impression Statement  Patient has met 2/3 short term goals and 3 of 4 long term goals with remaining goals not being met secondary to slightly impaired ROM. Patient has made progress in strength, ROM, endurance, activity tolerance and functional mobility over the course of treatment. He continues to have deficits in functional strength, end range knee flexion/extension mobility, and endurance with gait but he is pleased with his functional status and wishes to be done with physical therapy at this time. Patient is educated on continuing with his HEP and increasing overall activity in order to regain functional mobility and strength. Patient discharged from physical therapy at this time and is educated on returning to therapy if he wishes to improve functional status.    Personal Factors and Comorbidities  Comorbidity 1    Examination-Activity Limitations  Bathing;Carry;Bed Mobility;Dressing;Lift;Locomotion  Level;Sit;Sleep;Squat;Stairs;Stand;Transfers    Examination-Participation Restrictions  Cleaning;Community Activity;Driving;Laundry;Shop;Meal Prep;Yard Work    Stability/Clinical Decision Making  --    Rehab Potential  Good    PT Frequency  --    PT Duration  --    PT Treatment/Interventions  Passive range of motion;Manual techniques;Patient/family education;Therapeutic activities;Therapeutic exercise;Balance training;Stair training;Gait training;Cryotherapy;Electrical Stimulation    PT Next Visit Plan  Patient d/c from therapy at this time    PT Home Exercise Plan  LAQ< heelslides both sitting and supine, quad set. 12/11 deep breathing, log rolling 11/18/19 stairs, sit to stand squats       Patient will benefit from skilled therapeutic intervention  in order to improve the following deficits and impairments:  Abnormal gait, Decreased activity tolerance, Decreased balance, Decreased strength, Difficulty walking, Decreased range of motion, Increased edema, Pain  Visit Diagnosis: Stiffness of left knee, not elsewhere classified  Muscle weakness (generalized)  Acute pain of left knee  Other abnormalities of gait and mobility     Problem List Patient Active Problem List   Diagnosis Date Noted  . S/P total knee replacement, left 10/05/2019 10/19/2019  . Cellulitis 10/08/2019  . Acute knee pain 10/08/2019  . Primary osteoarthritis of left knee   . S/P total knee replacement, right 02/03/18    11:33 AM, 11/18/19 Mearl Latin PT, DPT Physical Therapist at [facility] [place], [place], [address] Phone: [phone]   Fax:  [phone]  Name: KYSHAUN BARNETTE MRN: [id number] Date of Birth: Feb 24, 1956

## 2019-11-22 ENCOUNTER — Ambulatory Visit (HOSPITAL_COMMUNITY): Payer: No Typology Code available for payment source | Admitting: Physical Therapy

## 2019-11-22 ENCOUNTER — Other Ambulatory Visit: Payer: Self-pay

## 2019-11-22 DIAGNOSIS — Z96652 Presence of left artificial knee joint: Secondary | ICD-10-CM

## 2019-11-22 MED ORDER — HYDROCODONE-ACETAMINOPHEN 7.5-325 MG PO TABS: 1.0000 | ORAL_TABLET | Freq: Three times a day (TID) | ORAL | 0 refills | Status: DC | PRN

## 2019-11-22 NOTE — Telephone Encounter (Signed)
Hydrocodone-Acetaminophen 7.5/325 mg Qty 21 Tablets  Take 1 tablet by mouth every 8(eight) hours as needed for up to 7 days for moderate pain.  PATIENT USES [facility]

## 2019-11-24 ENCOUNTER — Encounter: Payer: Self-pay | Admitting: Orthopedic Surgery

## 2019-11-24 ENCOUNTER — Other Ambulatory Visit: Payer: Self-pay

## 2019-11-24 ENCOUNTER — Ambulatory Visit (INDEPENDENT_AMBULATORY_CARE_PROVIDER_SITE_OTHER): Payer: No Typology Code available for payment source | Admitting: Orthopedic Surgery

## 2019-11-24 ENCOUNTER — Encounter (HOSPITAL_COMMUNITY): Payer: No Typology Code available for payment source

## 2019-11-24 DIAGNOSIS — Z96652 Presence of left artificial knee joint: Secondary | ICD-10-CM

## 2019-11-24 NOTE — Patient Instructions (Signed)
Increase activity slowly as tolerated   Work on range of motion and strength   Fu in 5 weeks   RTW ON JAN 18 TH

## 2019-11-24 NOTE — Progress Notes (Signed)
Chief Complaint  Patient presents with  . Post-op Follow-up    10/05/2019 left total knee replacement / improving     Routine follow-up status post left total knee  Patient is improving  His pain is well controlled with hydrocodone 7.5 mg decreasing it over time  He does have a slight flexion contracture in the knee this is passively correctable.  His knee flexion is about 95 degrees still has some knee swelling but there is no sign of infection  Recommend continue exercising increase activities as tolerated he can return to work on January 18 with restrictions  Usual work on strength and range of motion  I will see him in 5 weeks  Encounter Diagnosis  Name Primary?  . S/P total knee replacement, left 10/05/2019 Yes

## 2019-11-26 ENCOUNTER — Encounter (HOSPITAL_COMMUNITY): Payer: No Typology Code available for payment source

## 2019-11-29 ENCOUNTER — Other Ambulatory Visit: Payer: Self-pay

## 2019-11-29 DIAGNOSIS — Z96652 Presence of left artificial knee joint: Secondary | ICD-10-CM

## 2019-11-29 NOTE — Telephone Encounter (Signed)
Hydrocodone-Acetaminophen 7.5/325 mg  Qty 21 Tablets  Take 1 tablet by mouth every 8(hours) as needed for up to 7 days for moderate pain.  PATIENT USES [place]

## 2019-11-30 MED ORDER — HYDROCODONE-ACETAMINOPHEN 7.5-325 MG PO TABS: 1.0000 | ORAL_TABLET | Freq: Three times a day (TID) | ORAL | 0 refills | Status: DC | PRN

## 2019-12-06 ENCOUNTER — Other Ambulatory Visit: Payer: Self-pay

## 2019-12-06 DIAGNOSIS — Z96652 Presence of left artificial knee joint: Secondary | ICD-10-CM

## 2019-12-06 NOTE — Telephone Encounter (Signed)
Hydrocodone-Acetaminophen  7.5/325 mg  Qty  21 Tablets  Take 1 tablet by mouth every 8(eight) hours as needed for up to 7 days for moderate pain.  PATIENT USES  [facility]

## 2019-12-07 ENCOUNTER — Encounter: Payer: Self-pay | Admitting: Urology

## 2019-12-07 ENCOUNTER — Other Ambulatory Visit: Payer: Self-pay | Admitting: Orthopedic Surgery

## 2019-12-07 MED ORDER — HYDROCODONE-ACETAMINOPHEN 5-325 MG PO TABS: 1.0000 | ORAL_TABLET | Freq: Four times a day (QID) | ORAL | 0 refills | Status: DC | PRN

## 2019-12-07 NOTE — Progress Notes (Signed)
norco 5

## 2019-12-13 ENCOUNTER — Other Ambulatory Visit: Payer: Self-pay | Admitting: Orthopedic Surgery

## 2019-12-13 MED ORDER — HYDROCODONE-ACETAMINOPHEN 5-325 MG PO TABS: 1.0000 | ORAL_TABLET | Freq: Four times a day (QID) | ORAL | 0 refills | Status: DC | PRN

## 2019-12-13 NOTE — Telephone Encounter (Signed)
Patient requests refill on Hydrocodone/Acetaminophen 5-325  Mgs.  Qty  28  Sig: Take 1 tablet by mouth every 6 (six) hours as needed for up to 7 days for moderate pain  Patient states he uses [facility]

## 2019-12-20 ENCOUNTER — Other Ambulatory Visit: Payer: Self-pay | Admitting: Orthopedic Surgery

## 2019-12-20 MED ORDER — HYDROCODONE-ACETAMINOPHEN 5-325 MG PO TABS: 1.0000 | ORAL_TABLET | Freq: Four times a day (QID) | ORAL | 0 refills | Status: DC | PRN

## 2019-12-20 NOTE — Telephone Encounter (Signed)
Patient called for refill:  °HYDROcodone-acetaminophen (NORCO/VICODIN) 5-325 MG tablet 28 tablet  °-[facility], [place] ° °

## 2019-12-27 ENCOUNTER — Other Ambulatory Visit: Payer: Self-pay | Admitting: Orthopedic Surgery

## 2019-12-27 MED ORDER — HYDROCODONE-ACETAMINOPHEN 5-325 MG PO TABS: 1.0000 | ORAL_TABLET | Freq: Four times a day (QID) | ORAL | 0 refills | Status: AC | PRN

## 2019-12-27 NOTE — Telephone Encounter (Signed)
Patient called for refill:  HYDROcodone-acetaminophen (NORCO/VICODIN) 5-325 MG tablet 28 tablet  - [facility], [place] / patient aware of appointment this Wednesday, 12/29/19

## 2019-12-29 ENCOUNTER — Other Ambulatory Visit: Payer: Self-pay

## 2019-12-29 ENCOUNTER — Encounter: Payer: Self-pay | Admitting: Orthopedic Surgery

## 2019-12-29 ENCOUNTER — Ambulatory Visit (INDEPENDENT_AMBULATORY_CARE_PROVIDER_SITE_OTHER): Payer: No Typology Code available for payment source | Admitting: Orthopedic Surgery

## 2019-12-29 VITALS — BP 123/67 | HR 79 | Temp 97.2°F | Ht 71.0 in | Wt 223.0 lb

## 2019-12-29 DIAGNOSIS — Z96652 Presence of left artificial knee joint: Secondary | ICD-10-CM

## 2019-12-29 NOTE — Progress Notes (Signed)
Chief Complaint  Patient presents with  . Knee Pain    L/its not doing too good/DOS 10/05/19    3 months post op left tka   C/o pain lateral knee and swelling of the joint. hes working a lot.   His knee looks fine looks fine except for swelling. Its not warm red or infectious   Rec rest pain management   3 months follow up   4 x 12 hr shifts then 60 hr weeks

## 2020-03-01 ENCOUNTER — Encounter: Payer: Self-pay | Admitting: Orthopedic Surgery

## 2020-03-01 ENCOUNTER — Other Ambulatory Visit: Payer: Self-pay

## 2020-03-01 ENCOUNTER — Ambulatory Visit (INDEPENDENT_AMBULATORY_CARE_PROVIDER_SITE_OTHER): Payer: No Typology Code available for payment source | Admitting: Orthopedic Surgery

## 2020-03-01 VITALS — BP 137/73 | HR 87 | Ht 71.0 in | Wt 220.0 lb

## 2020-03-01 DIAGNOSIS — Z96652 Presence of left artificial knee joint: Secondary | ICD-10-CM

## 2020-03-01 DIAGNOSIS — M25561 Pain in right knee: Secondary | ICD-10-CM | POA: Diagnosis not present

## 2020-03-01 MED ORDER — PREDNISONE 10 MG (48) PO TBPK: ORAL_TABLET | Freq: Every day | ORAL | 0 refills | Status: DC

## 2020-03-01 NOTE — Patient Instructions (Addendum)
OOW 2 WEEKS   REST 2 WEEKS   START PREDNISONE , TAKE FOR 10 DAYS

## 2020-03-01 NOTE — Progress Notes (Signed)
Chief Complaint  Patient presents with  . Knee Pain    Recheck on left knee, DOS 10-05-19.    Melvin Turner had a left total knee went back to work probably too soon.  He is working 12-hour shifts comes in with increasing pain left knee with swelling  No fever no chills no signs of infection  He can bend his knee very well least 115 degrees but he has a 20 degree extensor lag.  He has a joint effusion.  His leg is swollen.  He does not have a DVT.  Recommend he rest for 2 weeks take prednisone Dosepak use ice to get the swelling down and I recommend that he try to work less    Encounter Diagnoses  Name Primary?  . Acute pain of right knee Yes  . S/P total knee replacement, left 10/05/2019

## 2020-07-11 ENCOUNTER — Other Ambulatory Visit: Payer: Self-pay

## 2020-08-23 ENCOUNTER — Ambulatory Visit: Payer: No Typology Code available for payment source | Admitting: Urology

## 2020-10-03 ENCOUNTER — Other Ambulatory Visit: Payer: Self-pay

## 2020-10-03 ENCOUNTER — Encounter: Payer: Self-pay | Admitting: Urology

## 2020-10-03 ENCOUNTER — Ambulatory Visit (INDEPENDENT_AMBULATORY_CARE_PROVIDER_SITE_OTHER): Payer: No Typology Code available for payment source | Admitting: Urology

## 2020-10-03 VITALS — BP 130/73 | HR 76 | Temp 97.9°F | Wt 223.0 lb

## 2020-10-03 DIAGNOSIS — R972 Elevated prostate specific antigen [PSA]: Secondary | ICD-10-CM | POA: Diagnosis not present

## 2020-10-03 DIAGNOSIS — R350 Frequency of micturition: Secondary | ICD-10-CM | POA: Diagnosis not present

## 2020-10-03 LAB — URINALYSIS, ROUTINE W REFLEX MICROSCOPIC
Bilirubin, UA: NEGATIVE
Glucose, UA: NEGATIVE
Ketones, UA: NEGATIVE
Leukocytes,UA: NEGATIVE
Nitrite, UA: NEGATIVE
Protein,UA: NEGATIVE
RBC, UA: NEGATIVE
Specific Gravity, UA: 1.025 (ref 1.005–1.030)
Urobilinogen, Ur: 0.2 mg/dL (ref 0.2–1.0)
pH, UA: 5.5 (ref 5.0–7.5)

## 2020-10-03 MED ORDER — LEVOFLOXACIN 750 MG PO TABS: 750.0000 mg | ORAL_TABLET | Freq: Once | ORAL | 0 refills | Status: AC

## 2020-10-03 NOTE — Progress Notes (Signed)
10/03/2020 3:19 PM   Melvin Turner 04/16/1956 [id number]  Referring provider: Toma Deiters, MD [address] [place],  [place] [address]  Elevated PSA  HPI: Melvin Turner is a 64yo here for evaluation of elevated PSA. He was previously seen by Dr. Wilson Singer in Aug 2020. PSA was 5.0 in 06/2020. He has mild LUTS on flomax 0.4mg  daily. He is currently happy with his urination.  Nocturia 0-2x. No hematuria or dysuriaHe is currently being treated with [facility] Spotted fever.   Records from [facility] are as follows: My PSA is elevated above the normal range.  HPI: Melvin Turner is a 64 year-old male established patient who is here for an elevated PSA.    07/02/19: Melvin Turner returns today in f/u. He has a history of an elevated PSA that fell to 1.6 on repeat following his office visit on 01/22/19. His testosterone was low at 207 but he remains on chronic narcotics for his knee pain. He had long standing severe LUTS at that visit and is to have a flow rate and PVR today. The prostate biopsy was cancelled with the normalization of the PSA. He remains on tamsulosin and has tried bid but didn't notice any benefit from the increased dose. His PF today is 19ml/sec and his PVR is 92ml.    01/22/19: Melvin Turner is a 65 yo male who is sent by Dr. Olena Leatherwood for an elevated PSA of 4.7 in 11/19. He had seen Dr. Nechama Guard? in [place] previously. He has not had a prior biopsy. He has severe LUTS with an IPSS of 27. He has had issues for 10+ years. He has intermittency and a dribbling stream. He has nocturia x 4. he has a reduced stream. He has been on tamsulosin which helps. He has had no hematuria or dysuria. he has not had cystoscopy. He has no history of UTI's or stones. He has no family history of prostate cancer.     AUA Symptom Score: Almost always he has the sensation of not emptying his bladder completely when finished urinating. 50% of the time he has to urinate again fewer than two hours after he has finished urinating. More  than 50% of the time he has to start and stop again several times when he urinates. 50% of the time he finds it difficult to postpone urination. More than 50% of the time he has a weak urinary stream. More than 50% of the time he has to push or strain to begin urination. He has to get up to urinate 4 times from the time he goes to bed until the time he gets up in the morning.   Calculated AUA Symptom Score: 27    PMH: Past Medical History:  Diagnosis Date  . Arthritis   . BPH (benign prostatic hyperplasia)   . GERD (gastroesophageal reflux disease)   . Hypertension     Surgical History: Past Surgical History:  Procedure Laterality Date  . COLONOSCOPY    . KNEE ARTHROSCOPY Right 01/31/2015   Procedure: RIGHT KNEE ARTHROSCOPY, PARTIAL MEDIAL MENISECTOMY;  Surgeon: Darreld Mclean, MD;  Location: [facility];  Service: Orthopedics;  Laterality: Right;  . TONSILLECTOMY    . TOTAL KNEE ARTHROPLASTY Right 02/03/2018   Procedure: TOTAL KNEE ARTHROPLASTY;  Surgeon: Vickki Hearing, MD;  Location: [facility];  Service: Orthopedics;  Laterality: Right;  . TOTAL KNEE ARTHROPLASTY Left 10/05/2019   Procedure: LEFT TOTAL KNEE ARTHROPLASTY;  Surgeon: Vickki Hearing, MD;  Location: [facility];  Service: Orthopedics;  Laterality: Left;  Marland Kitchen VASECTOMY      Home Medications:  Allergies as of 10/03/2020      Reactions   Poison Ivy Extract [poison Ivy Extract] Hives, Rash      Medication List       Accurate as of October 03, 2020  3:19 PM. If you have any questions, ask your nurse or doctor.        STOP taking these medications   baclofen 10 MG tablet Commonly known as: LIORESAL Stopped by: Wilkie Aye, MD   gabapentin 300 MG capsule Commonly known as: NEURONTIN Stopped by: Wilkie Aye, MD     TAKE these medications   amLODipine 5 MG tablet Commonly known as: NORVASC   benazepril 20 MG tablet Commonly known as: LOTENSIN Take 20 mg by mouth daily.   doxycycline 100 MG  capsule Commonly known as: VIBRAMYCIN Take 100 mg by mouth 2 (two) times daily.   HYDROcodone-acetaminophen 10-325 MG tablet Commonly known as: NORCO TAKE 1 TABLET BY MOUTH 4 TIMES DAILY AS NEEDED   L-ARGININE-500 PO Take 1 tablet by mouth daily.   multivitamin with minerals Tabs tablet Take 1 tablet by mouth daily.   omeprazole 20 MG capsule Commonly known as: PRILOSEC   predniSONE 10 MG (48) Tbpk tablet Commonly known as: STERAPRED UNI-PAK 48 TAB Take by mouth daily.   tamsulosin 0.4 MG Caps capsule Commonly known as: FLOMAX Take 0.4 mg by mouth.   Vitamin C CR 1000 MG Tbcr Take 1 tablet by mouth daily.       Allergies:  Allergies  Allergen Reactions  . Poison Ivy Extract [Poison Ivy Extract] Hives and Rash    Family History: Family History  Problem Relation Age of Onset  . Heart attack Son     Social History:  reports that he quit smoking about 14 years ago. His smoking use included cigarettes. He has a 30.00 pack-year smoking history. He has never used smokeless tobacco. He reports current alcohol use. He reports that he does not use drugs.  ROS: All other review of systems were reviewed and are negative except what is noted above in HPI  Physical Exam: BP 130/73   Pulse 76   Temp 97.9 F (36.6 C)   Wt 223 lb (101.2 kg)   BMI 31.10 kg/m   Constitutional:  Alert and oriented, No acute distress. HEENT: [place] AT, moist mucus membranes.  Trachea midline, no masses. Cardiovascular: No clubbing, cyanosis, or edema. Respiratory: Normal respiratory effort, no increased work of breathing. GI: Abdomen is soft, nontender, nondistended, no abdominal masses GU: No CVA tenderness.   Lymph: No cervical or inguinal lymphadenopathy. Skin: No rashes, bruises or suspicious lesions. Neurologic: Grossly intact, no focal deficits, moving all 4 extremities. Psychiatric: Normal mood and affect.  Laboratory Data: Lab Results  Component Value Date   WBC 13.4 (H)  10/11/2019   HGB 11.7 (L) 10/11/2019   HCT 36.3 (L) 10/11/2019   MCV 96.3 10/11/2019   PLT 276 10/11/2019    Lab Results  Component Value Date   CREATININE 0.68 10/11/2019    No results found for: PSA  No results found for: TESTOSTERONE  No results found for: HGBA1C  Urinalysis    Component Value Date/Time   COLORURINE YELLOW 01/30/2015 1345   APPEARANCEUR CLEAR 01/30/2015 1345   LABSPEC 1.025 01/30/2015 1345   PHURINE 6.0 01/30/2015 1345   GLUCOSEU NEGATIVE 01/30/2015 1345   HGBUR NEGATIVE 01/30/2015 1345   BILIRUBINUR NEGATIVE 01/30/2015 1345  KETONESUR NEGATIVE 01/30/2015 1345   PROTEINUR NEGATIVE 01/30/2015 1345   UROBILINOGEN 0.2 01/30/2015 1345   NITRITE NEGATIVE 01/30/2015 1345   LEUKOCYTESUR NEGATIVE 01/30/2015 1345    No results found for: LABMICR, WBCUA, RBCUA, LABEPIT, MUCUS, BACTERIA  Pertinent Imaging:  No results found for this or any previous visit.  Results for orders placed during the hospital encounter of 10/08/19  US Venous Img Lower Bilateral (DVT)  Narrative CLINICAL DATA:  Swelling and pain, left. Recent knee replacement surgery.  EXAM: BILATERAL LOWER EXTREMITY VENOUS DOPPLER ULTRASOUND  TECHNIQUE: Gray-scale sonography with compression, as well as color and duplex ultrasound, were performed to evaluate the deep venous system from the level of the common femoral vein through the popliteal and proximal calf veins.  COMPARISON:  None  FINDINGS: Normal compressibility of the common femoral, superficial femoral, and popliteal veins, as well as the proximal calf veins. No filling defects to suggest DVT on grayscale or color Doppler imaging. Doppler waveforms show normal direction of venous flow, normal respiratory phasicity and response to augmentation. Subcutaneous edema in the left calf.  Visualized segments of the saphenous venous systems normal in caliber and compressibility.  IMPRESSION: No femoropopliteal and no calf DVT  in the visualized calf veins. If clinical symptoms are inconsistent or if there are persistent or worsening symptoms, further imaging (possibly involving the iliac veins) may be warranted.   Electronically Signed By: Corlis Leak M.D. On: 10/12/2019 10:29  No results found for this or any previous visit.  No results found for this or any previous visit.  No results found for this or any previous visit.  No results found for this or any previous visit.  No results found for this or any previous visit.  No results found for this or any previous visit.   Assessment & Plan:    1. Elevated PSA The patient and I talked about etiologies of elevated PSA.  We discussed the possible relationship between elevated PSA, prostate cancer, BPH, prostatitis, and UTI.   Conservative treatment of elevated PSA with watchful waiting was discussed with the patient.  All questions were answered.        All of the risks and benefits along with alternatives to prostate biopsy were discussed with the patient.  The patient gave fully informed consent to proceed with a transrectal ultrasound guided biopsy of the prostate for the evaluation of their evated PSA.  Prostate biopsy instructions and antibiotics were given to the patient.  - Urinalysis, Routine w reflex microscopic   No follow-ups on file.  Wilkie Aye, MD  [facility] [place]

## 2020-10-03 NOTE — Patient Instructions (Addendum)
Appointment Time: 12pm arrival Appointment Date: 12/07/2019  Location: [facility] Radiology Department   Prostate Biopsy Instructions  Stop all aspirin or blood thinners (aspirin, plavix, coumadin, warfarin, motrin, ibuprofen, advil, aleve, naproxen, naprosyn) for 7 days prior to the procedure.  If you have any questions about stopping these medications, please contact your primary care physician or cardiologist.  Having a light meal prior to the procedure is recommended.  If you are diabetic or have low blood sugar please bring a small snack or glucose tablet.  A Fleets enema is needed to be purchased over the counter at a local pharmacy and used 2 hours before you scheduled appointment.  This can be purchased over the counter at any pharmacy.  Antibiotics will be administered in the clinic at the time of the procedure and 1 tablet has been sent to your pharmacy. Please take the antibiotic as prescribed.    Please bring someone with you to the procedure to drive you home if you are given a valium to take prior to your procedure.   If you have any questions or concerns, please feel free to call the office at [phone] or send a Mychart message.    Thank you, [facility]    Transrectal Ultrasound-Guided Prostate Biopsy This is a procedure to take samples of tissue from your prostate. Ultrasound images are used to guide the procedure. It is usually done to check for prostate cancer. What happens before the procedure? Staying hydrated Follow instructions about liquids. These may include:  Up to 2 hours before the procedure - you may drink clear liquids. This includes water, clear fruit juice, black coffee, and plain tea.  Eating and drinking restrictions Follow instructions about eating and drinking. These may include:  8 hours before the procedure - stop eating heavy meals or foods. This includes meat, fried foods, or fatty foods.  6 hours before the procedure -  stop eating light meals or foods. This includes toast or cereal.  6 hours before the procedure - stop drinking milk or drinks that have milk.  2 hours before the procedure - stop drinking clear liquids. Medicines Ask your doctor about:  Changing or stopping your normal medicines. This is important if you take diabetes medicines or blood thinners.  Taking over-the-counter medicines, vitamins, herbs, and supplements.  Taking medicines such as aspirin and ibuprofen. These medicines can thin your blood. Do not take these medicines unless your doctor tells you to take them. General instructions  You may be given antibiotic medicine. If so, take it as told by your doctor.  Liquid will be used to clear waste from your butt (enema).  You may have a blood sample taken.  You may have a pee (urine) sample taken.  Plan to have someone take you home after your procedure. What happens during the procedure?   To lower your risk of infection: ? Your health care team will wash or sanitize their hands. ? Hair may be removed from the area. ? Your skin will be cleaned with soap. ? You will be given antibiotics.  An IV will be placed into one of your veins.  You will be given one or both of these: ? A medicine to help you relax. ? A medicine to numb the area.  You will lie on your left side. Your knees will be bent.  A probe with gel on it will be placed in your butt. Pictures will be taken of your prostate and the  area around it.  Medicine will be used to numb your prostate.  A needle will be placed in your butt and moved to your prostate.  Prostate tissue will be removed.  The samples will be sent to a lab. The procedure may vary. What happens after the procedure?  You will be watched until the medicines you were given have worn off.  You may have some pain in your butt. You will be given medicine for it. Summary  This procedure is usually done to check for prostate  cancer.  Before the procedure, ask your doctor about changing or stopping your medicines.  You may have some pain in your butt. You will be given medicine for it.  Plan to have someone take you home after the procedure. This information is not intended to replace advice given to you by your health care provider. Make sure you discuss any questions you have with your health care provider. Document Revised: 02/24/2019 Document Reviewed: 01/31/2017 Elsevier Patient Education  [place].

## 2020-10-03 NOTE — Progress Notes (Signed)
Urological Symptom Review  Patient is experiencing the following symptoms: Frequent urination Hard to postpone urination Burning/pain with urination Get up at night to urinate Leakage of urine Stream starts and stops Trouble starting stream Have to strain to urinate Erection problems (male only)   Review of Systems  Gastrointestinal (upper)  : Negative for upper GI symptoms  Gastrointestinal (lower) : Negative for lower GI symptoms  Constitutional : Negative for symptoms  Skin: Negative for skin symptoms  Eyes: Negative for eye symptoms  Ear/Nose/Throat : Negative for Ear/Nose/Throat symptoms  Hematologic/Lymphatic: Negative for Hematologic/Lymphatic symptoms  Cardiovascular : Negative for cardiovascular symptoms  Respiratory : Negative for respiratory symptoms  Endocrine: Negative for endocrine symptoms  Musculoskeletal: Back pain Joint pain  Neurological: Negative for neurological symptoms  Psychologic: Negative for psychiatric symptoms

## 2020-12-06 ENCOUNTER — Other Ambulatory Visit: Payer: Self-pay | Admitting: Urology

## 2020-12-06 ENCOUNTER — Other Ambulatory Visit: Payer: Self-pay

## 2020-12-06 ENCOUNTER — Ambulatory Visit (HOSPITAL_COMMUNITY)
Admission: RE | Admit: 2020-12-06 | Discharge: 2020-12-06 | Disposition: A | Discharge status: Home / Self Care | Payer: No Typology Code available for payment source | Source: Ambulatory Visit | Attending: Urology | Admitting: Urology

## 2020-12-06 ENCOUNTER — Encounter (HOSPITAL_COMMUNITY): Payer: Self-pay

## 2020-12-06 ENCOUNTER — Ambulatory Visit (INDEPENDENT_AMBULATORY_CARE_PROVIDER_SITE_OTHER): Payer: No Typology Code available for payment source | Admitting: Urology

## 2020-12-06 DIAGNOSIS — R972 Elevated prostate specific antigen [PSA]: Secondary | ICD-10-CM | POA: Insufficient documentation

## 2020-12-06 MED ORDER — GENTAMICIN SULFATE 40 MG/ML IJ SOLN
INTRAMUSCULAR | Status: AC
  Filled 2020-12-06: qty 2

## 2020-12-06 MED ORDER — GENTAMICIN SULFATE 40 MG/ML IJ SOLN
INTRAMUSCULAR | Status: AC
  Administered 2020-12-06: 80 mg via INTRAMUSCULAR
  Filled 2020-12-06: qty 2

## 2020-12-06 MED ORDER — LIDOCAINE HCL (PF) 2 % IJ SOLN
INTRAMUSCULAR | Status: AC
  Filled 2020-12-06: qty 10

## 2020-12-06 MED ORDER — GENTAMICIN SULFATE 40 MG/ML IJ SOLN: 80.0000 mg | Freq: Once | INTRAMUSCULAR | Status: AC

## 2020-12-07 ENCOUNTER — Encounter: Payer: Self-pay | Admitting: Urology

## 2020-12-07 NOTE — Patient Instructions (Signed)

## 2020-12-07 NOTE — Progress Notes (Signed)
Prostate Biopsy Procedure   Informed consent was obtained after discussing risks/benefits of the procedure.  A time out was performed to ensure correct patient identity.  Pre-Procedure: - Last PSA Level: No results found for: PSA - Gentamicin given prophylactically - Levaquin 500 mg administered PO -Transrectal Ultrasound performed revealing a 78.9 gm prostate -No significant hypoechoic or median lobe noted  Procedure: - Prostate block performed using 10 cc 1% lidocaine and biopsies taken from sextant areas, a total of 12 under ultrasound guidance.  Post-Procedure: - Patient tolerated the procedure well - He was counseled to seek immediate medical attention if experiences any severe pain, significant bleeding, or fevers - Return in one week to discuss biopsy results

## 2020-12-11 ENCOUNTER — Telehealth: Payer: Self-pay

## 2020-12-11 NOTE — Telephone Encounter (Signed)
Pt called and notified of biopsy results. appt mailed to patient as well.

## 2020-12-11 NOTE — Telephone Encounter (Signed)
-----   Message from Malen Gauze, MD sent at 12/11/2020 10:28 AM EST ----- Negative. Have him followup in 6 months with PSA ----- Message ----- From: Ferdinand Lango, RN Sent: 12/08/2020   5:33 PM EST To: Malen Gauze, MD  Please review

## 2020-12-13 ENCOUNTER — Ambulatory Visit: Payer: No Typology Code available for payment source | Admitting: Urology

## 2021-04-05 ENCOUNTER — Other Ambulatory Visit: Payer: Self-pay

## 2021-04-05 ENCOUNTER — Ambulatory Visit: Payer: No Typology Code available for payment source | Admitting: Orthopaedic Surgery

## 2021-04-05 ENCOUNTER — Encounter: Payer: Self-pay | Admitting: Orthopaedic Surgery

## 2021-04-05 VITALS — BP 135/71 | HR 58 | Ht 71.0 in | Wt 219.0 lb

## 2021-04-05 DIAGNOSIS — G8929 Other chronic pain: Secondary | ICD-10-CM | POA: Diagnosis not present

## 2021-04-05 DIAGNOSIS — M25511 Pain in right shoulder: Secondary | ICD-10-CM

## 2021-04-05 NOTE — Progress Notes (Signed)
My shoulder hurts.  He has pain in the right shoulder, with painful but full ROM.  NV intact.  PROCEDURE NOTE:  The patient request injection, verbal consent was obtained.  The right shoulder was prepped appropriately after time out was performed.   Sterile technique was observed and injection of 1 cc of Celestone 6 mg with several cc's of plain xylocaine. Anesthesia was provided by ethyl chloride and a 20-gauge needle was used to inject the shoulder area. A posterior approach was used.  The injection was tolerated well.  A band aid dressing was applied.  The patient was advised to apply ice later today and tomorrow to the injection sight as needed.  Return as needed.  Call if any problem.  Precautions discussed.   Electronically Signed Darreld Mclean, MD 5/19/20229:54 AM

## 2021-04-12 ENCOUNTER — Other Ambulatory Visit: Payer: Self-pay

## 2021-04-12 ENCOUNTER — Ambulatory Visit: Payer: No Typology Code available for payment source | Admitting: Orthopaedic Surgery

## 2021-04-12 ENCOUNTER — Encounter: Payer: Self-pay | Admitting: Orthopaedic Surgery

## 2021-04-12 VITALS — BP 144/78 | HR 54 | Ht 71.0 in | Wt 226.4 lb

## 2021-04-12 DIAGNOSIS — M25511 Pain in right shoulder: Secondary | ICD-10-CM | POA: Diagnosis not present

## 2021-04-12 DIAGNOSIS — G8929 Other chronic pain: Secondary | ICD-10-CM

## 2021-04-12 NOTE — Progress Notes (Signed)
Patient BP:ZWCH F Joles, male DOB:03/02/1956, 65 y.o. ENI:[id number]  Chief Complaint  Patient presents with  . Shoulder Pain    R/ still hurting and using it starts the pain.    HPI  Melvin Turner is a 65 y.o. male who has continued pain of the right shoulder.  The injection last time did not help.  He has pain with overhead use, sleeping on it and using a hammer.  He has no new trauma, no weakness, no numbness.  I have told him to begin Aleve one bid for several days then increase to two bid if it does not bother his stomach.  He is on Prilosec.  He may need MRI.   Body mass index is 31.57 kg/m.  ROS  Review of Systems  Musculoskeletal: Positive for arthralgias and myalgias.    All other systems reviewed and are negative.  The following is a summary of the past history medically, past history surgically, known current medicines, social history and family history.  This information is gathered electronically by the computer from prior information and documentation.  I review this each visit and have found including this information at this point in the chart is beneficial and informative.    Past Medical History:  Diagnosis Date  . Arthritis   . BPH (benign prostatic hyperplasia)   . GERD (gastroesophageal reflux disease)   . Hypertension     Past Surgical History:  Procedure Laterality Date  . COLONOSCOPY    . KNEE ARTHROSCOPY Right 01/31/2015   Procedure: RIGHT KNEE ARTHROSCOPY, PARTIAL MEDIAL MENISECTOMY;  Surgeon: Darreld Mclean, MD;  Location: [facility];  Service: Orthopedics;  Laterality: Right;  . TONSILLECTOMY    . TOTAL KNEE ARTHROPLASTY Right 02/03/2018   Procedure: TOTAL KNEE ARTHROPLASTY;  Surgeon: Vickki Hearing, MD;  Location: [facility];  Service: Orthopedics;  Laterality: Right;  . TOTAL KNEE ARTHROPLASTY Left 10/05/2019   Procedure: LEFT TOTAL KNEE ARTHROPLASTY;  Surgeon: Vickki Hearing, MD;  Location: [facility];  Service: Orthopedics;  Laterality: Left;   Marland Kitchen VASECTOMY      Family History  Problem Relation Age of Onset  . Heart attack Son     Social History Social History   Tobacco Use  . Smoking status: Former Smoker    Packs/day: 1.00    Years: 30.00    Pack years: 30.00    Types: Cigarettes    Quit date: 01/29/2006    Years since quitting: 15.2  . Smokeless tobacco: Never Used  Vaping Use  . Vaping Use: Never used  Substance Use Topics  . Alcohol use: Yes    Comment: occ  . Drug use: No    Allergies  Allergen Reactions  . Poison Ivy Extract [Poison Ivy Extract] Hives and Rash    Current Outpatient Medications  Medication Sig Dispense Refill  . amLODipine (NORVASC) 5 MG tablet     . Ascorbic Acid (VITAMIN C CR) 1000 MG TBCR Take 1 tablet by mouth daily.    . benazepril (LOTENSIN) 20 MG tablet Take 20 mg by mouth daily.    Marland Kitchen doxycycline (VIBRAMYCIN) 100 MG capsule Take 100 mg by mouth 2 (two) times daily.    Marland Kitchen HYDROcodone-acetaminophen (NORCO) 10-325 MG tablet TAKE 1 TABLET BY MOUTH 4 TIMES DAILY AS NEEDED    . Multiple Vitamin (MULTIVITAMIN WITH MINERALS) TABS tablet Take 1 tablet by mouth daily.    Marland Kitchen omeprazole (PRILOSEC) 20 MG capsule     . predniSONE (STERAPRED UNI-PAK 48 TAB)  10 MG (48) TBPK tablet Take by mouth daily. 48 tablet 0  . tamsulosin (FLOMAX) 0.4 MG CAPS capsule Take 0.4 mg by mouth.    . L-ARGININE-500 PO Take 1 tablet by mouth daily.  (Patient not taking: Reported on 04/12/2021)     No current facility-administered medications for this visit.     Physical Exam  Blood pressure (!) 144/78, pulse (!) 54, height 5\' 11"  (1.803 m), weight 226 lb 6 oz (102.7 kg).  Constitutional: overall normal hygiene, normal nutrition, well developed, normal grooming, normal body habitus. Assistive device:none  Musculoskeletal: gait and station Limp none, muscle tone and strength are normal, no tremors or atrophy is present.  .  Neurological: coordination overall normal.  Deep tendon reflex/nerve stretch intact.   Sensation normal.  Cranial nerves II-XII intact.   Skin:   Normal overall no scars, lesions, ulcers or rashes. No psoriasis.  Psychiatric: Alert and oriented x 3.  Recent memory intact, remote memory unclear.  Normal mood and affect. Well groomed.  Good eye contact.  Cardiovascular: overall no swelling, no varicosities, no edema bilaterally, normal temperatures of the legs and arms, no clubbing, cyanosis and good capillary refill.  Lymphatic: palpation is normal.  Right shoulder has full ROM but pain in the extremes.  All other systems reviewed and are negative   The patient has been educated about the nature of the problem(s) and counseled on treatment options.  The patient appeared to understand what I have discussed and is in agreement with it.  Encounter Diagnosis  Name Primary?  . Chronic pain in right shoulder Yes    PLAN Call if any problems.  Precautions discussed.  Continue current medications. Add the Aleve as stated.  Return to clinic 3 weeks   Consider MRI if not improved.  Electronically Signed , MD 5/26/20228:39 AM

## 2021-05-03 ENCOUNTER — Ambulatory Visit: Payer: No Typology Code available for payment source | Admitting: Orthopaedic Surgery

## 2021-05-29 ENCOUNTER — Other Ambulatory Visit: Payer: No Typology Code available for payment source

## 2021-06-06 ENCOUNTER — Ambulatory Visit: Payer: No Typology Code available for payment source | Admitting: Urology

## 2021-08-15 IMAGING — US US EXTREM LOW VENOUS
1 series · 14 of 24 positions shown · non-contrast
Comparison: None

CLINICAL DATA: Swelling and pain, left. Recent knee replacement
surgery.

EXAM:
BILATERAL LOWER EXTREMITY VENOUS DOPPLER ULTRASOUND
TECHNIQUE: Gray-scale sonography with compression, as well as color and duplex
ultrasound, were performed to evaluate the deep venous system from
the level of the common femoral vein through the popliteal and
proximal calf veins.

[Series 1: us extrem low venous · 0.08mm/px · 14 of 90 slices shown]
[im 1/90]
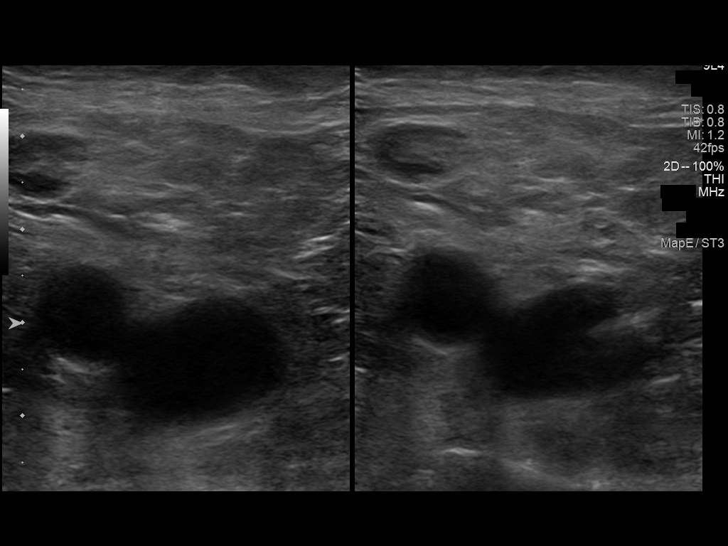
[im 8/90]
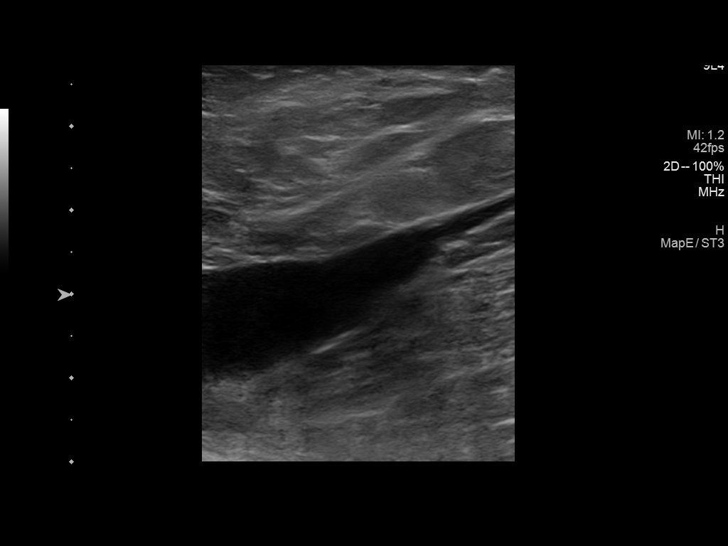
[im 16/90]
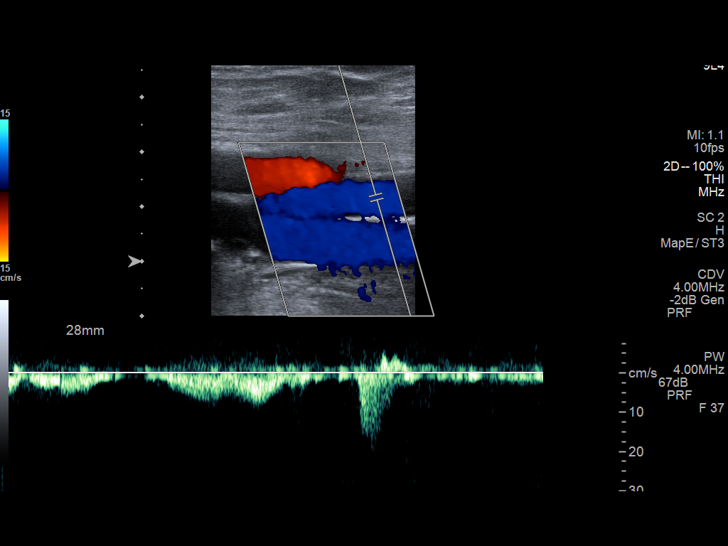
[im 24/90]
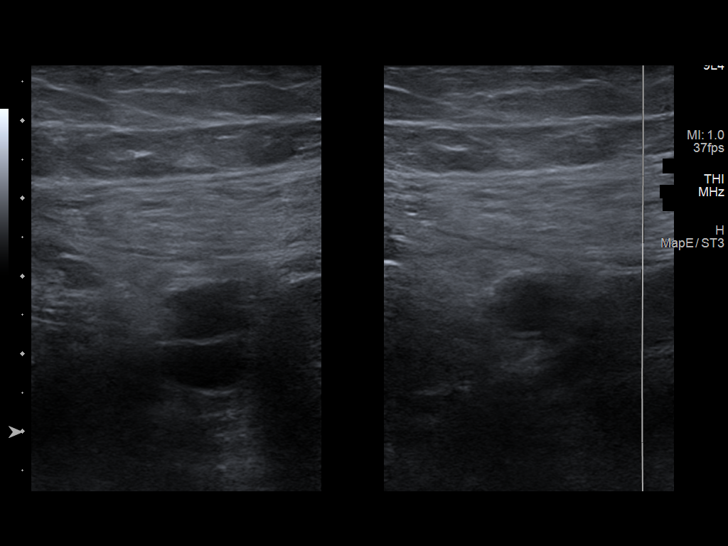
[im 28/90]
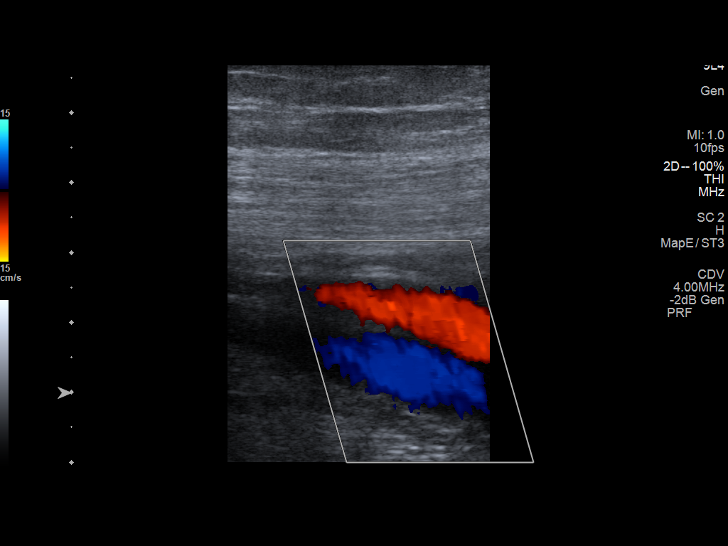
[im 35/90]
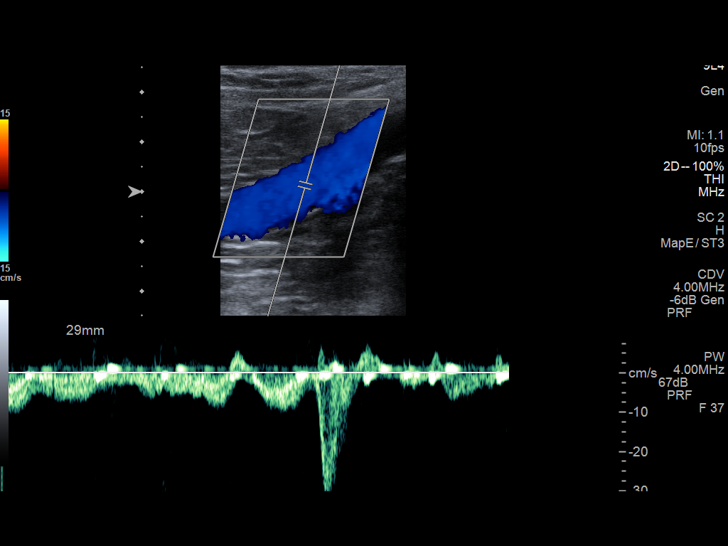
[im 43/90]
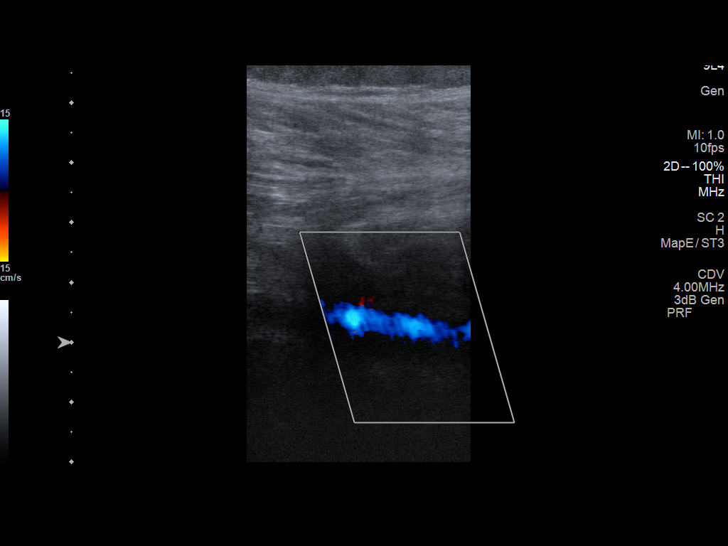
[im 47/90]
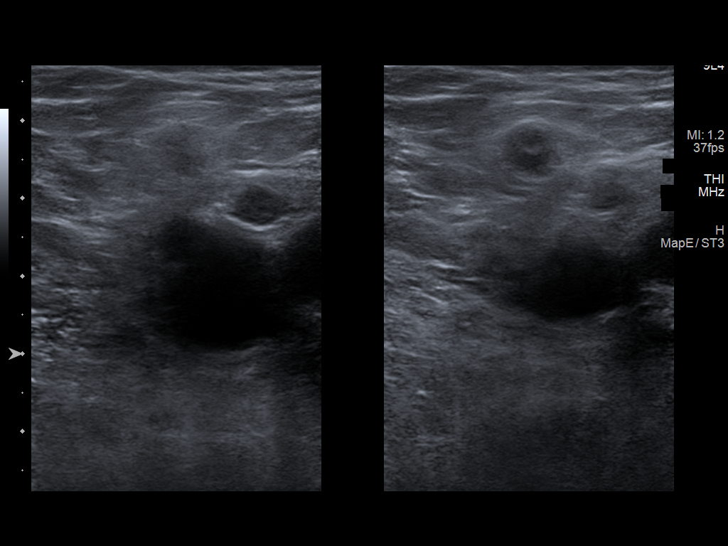
[im 55/90]
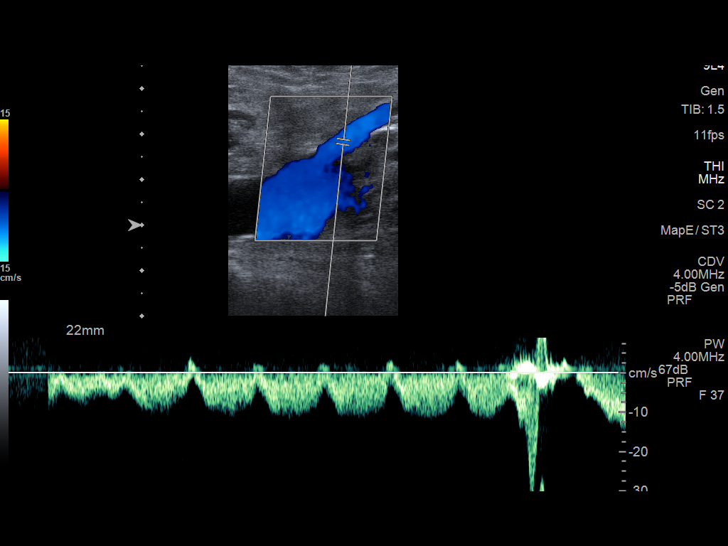
[im 62/90]
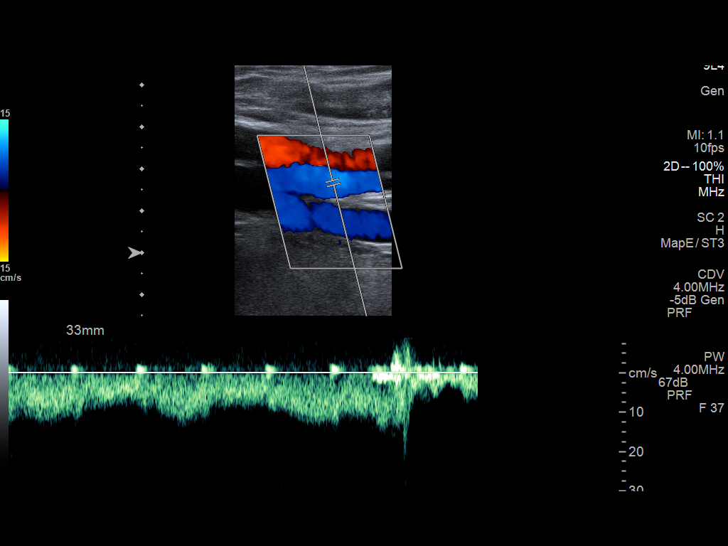
[im 70/90]
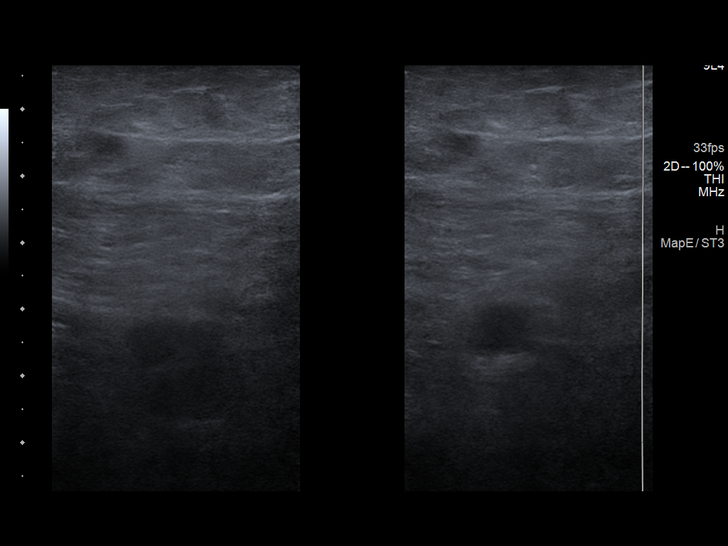
[im 74/90]
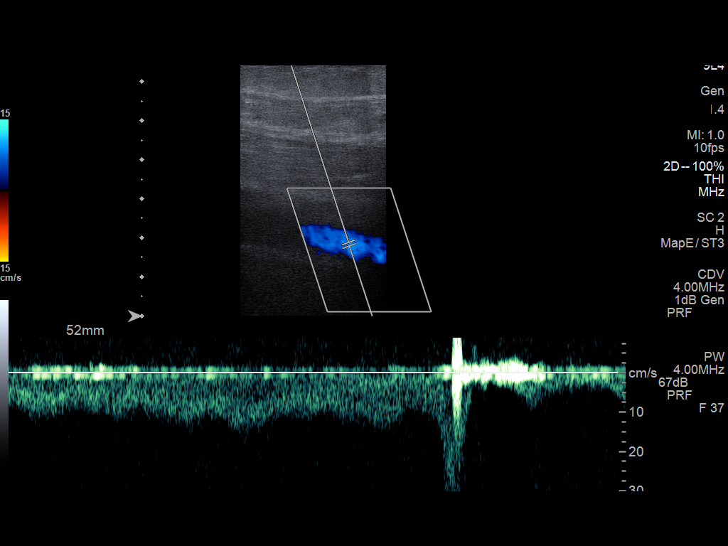
[im 82/90]
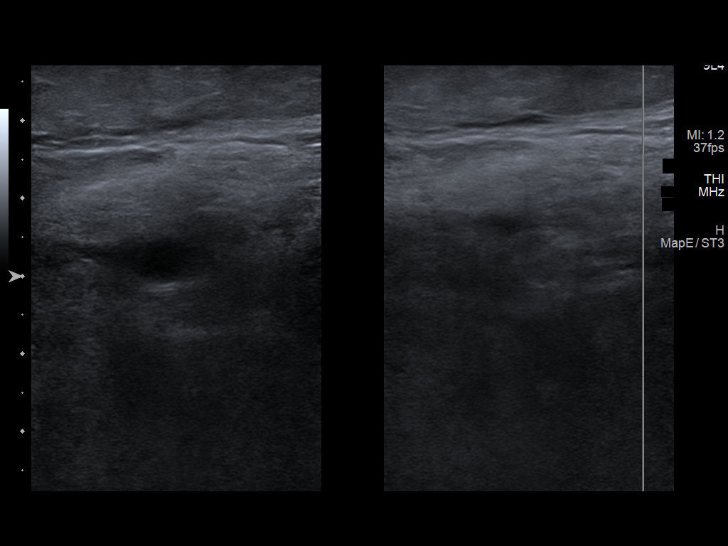
[im 90/90]
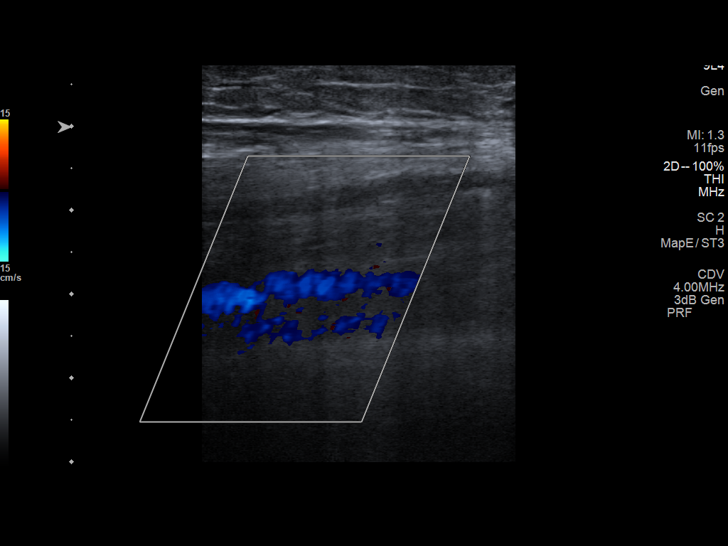

[14 of 24 positions shown; findings below may reference images not displayed]

FINDINGS: Normal compressibility of the common femoral, superficial femoral,
and popliteal veins, as well as the proximal calf veins. No filling
defects to suggest DVT on grayscale or color Doppler imaging.
Doppler waveforms show normal direction of venous flow, normal
respiratory phasicity and response to augmentation. Subcutaneous
edema in the left calf.

Visualized segments of the saphenous venous systems normal in
caliber and compressibility.
IMPRESSION: No femoropopliteal and no calf DVT in the visualized calf veins. If
clinical symptoms are inconsistent or if there are persistent or
worsening symptoms, further imaging (possibly involving the iliac
veins) may be warranted.

## 2022-02-05 ENCOUNTER — Emergency Department (HOSPITAL_COMMUNITY): Payer: PRIVATE HEALTH INSURANCE

## 2022-02-05 ENCOUNTER — Other Ambulatory Visit: Payer: Self-pay

## 2022-02-05 ENCOUNTER — Emergency Department (HOSPITAL_COMMUNITY)
Admission: EM | Admit: 2022-02-05 | Discharge: 2022-02-05 | Disposition: A | Discharge status: Home / Self Care | Payer: PRIVATE HEALTH INSURANCE | Attending: Emergency Medicine | Admitting: Emergency Medicine

## 2022-02-05 ENCOUNTER — Encounter (HOSPITAL_COMMUNITY): Payer: Self-pay

## 2022-02-05 DIAGNOSIS — I10 Essential (primary) hypertension: Secondary | ICD-10-CM | POA: Diagnosis not present

## 2022-02-05 DIAGNOSIS — I471 Supraventricular tachycardia: Secondary | ICD-10-CM | POA: Diagnosis not present

## 2022-02-05 DIAGNOSIS — Z79899 Other long term (current) drug therapy: Secondary | ICD-10-CM | POA: Insufficient documentation

## 2022-02-05 DIAGNOSIS — R0789 Other chest pain: Secondary | ICD-10-CM | POA: Diagnosis present

## 2022-02-05 LAB — CBC WITH DIFFERENTIAL/PLATELET
Abs Immature Granulocytes: 0.01 10*3/uL (ref 0.00–0.07)
Basophils Absolute: 0.1 10*3/uL (ref 0.0–0.1)
Basophils Relative: 1 %
Eosinophils Absolute: 0.2 10*3/uL (ref 0.0–0.5)
Eosinophils Relative: 3 %
HCT: 50.2 % (ref 39.0–52.0)
Hemoglobin: 16.6 g/dL (ref 13.0–17.0)
Immature Granulocytes: 0 %
Lymphocytes Relative: 45 %
Lymphs Abs: 4.2 10*3/uL — ABNORMAL HIGH (ref 0.7–4.0)
MCH: 31.3 pg (ref 26.0–34.0)
MCHC: 33.1 g/dL (ref 30.0–36.0)
MCV: 94.5 fL (ref 80.0–100.0)
Monocytes Absolute: 0.9 10*3/uL (ref 0.1–1.0)
Monocytes Relative: 10 %
Neutro Abs: 3.7 10*3/uL (ref 1.7–7.7)
Neutrophils Relative %: 41 %
Platelets: 268 10*3/uL (ref 150–400)
RBC: 5.31 MIL/uL (ref 4.22–5.81)
RDW: 13.2 % (ref 11.5–15.5)
WBC: 9.1 10*3/uL (ref 4.0–10.5)
nRBC: 0 % (ref 0.0–0.2)

## 2022-02-05 LAB — BASIC METABOLIC PANEL
Anion gap: 10 (ref 5–15)
BUN: 16 mg/dL (ref 8–23)
CO2: 24 mmol/L (ref 22–32)
Calcium: 9.4 mg/dL (ref 8.9–10.3)
Chloride: 106 mmol/L (ref 98–111)
Creatinine, Ser: 0.8 mg/dL (ref 0.61–1.24)
GFR, Estimated: >60 mL/min (ref >60)
Glucose, Bld: 127 mg/dL — ABNORMAL HIGH (ref 70–99)
Potassium: 4.1 mmol/L (ref 3.5–5.1)
Sodium: 140 mmol/L (ref 135–145)

## 2022-02-05 LAB — MAGNESIUM: Magnesium: 2.1 mg/dL (ref 1.7–2.4)

## 2022-02-05 MED ORDER — SODIUM CHLORIDE 0.9 % IV BOLUS
1000.0000 mL | Freq: Once | INTRAVENOUS | Status: AC
  Administered 2022-02-05: 1000 mL via INTRAVENOUS

## 2022-02-05 MED ORDER — ADENOSINE 6 MG/2ML IV SOLN
6.0000 mg | Freq: Once | INTRAVENOUS | Status: AC
  Administered 2022-02-05: 6 mg via INTRAVENOUS

## 2022-02-05 NOTE — Discharge Instructions (Signed)
If you develop recurrent, continued, or worsening chest pain, shortness of breath, fever, vomiting, abdominal or back pain, or any other new/concerning symptoms then return to the ER for evaluation.  

## 2022-02-05 NOTE — ED Triage Notes (Signed)
Patient seen this morning at doctors office and told to come to the ER due to heart rate in the 150s. Does have some chest pain ?

## 2022-02-05 NOTE — ED Provider Notes (Signed)
?[facility] EMERGENCY DEPARTMENT ?Provider Note ? ? ?CSN: [id number] ?Arrival date & time: 02/05/22  0940 ? ?  ? ?History ? ?Chief Complaint  ?Patient presents with  ? Tachycardia  ? ? ?Melvin Turner is a 66 y.o. male. ? ?HPI ?66 year old male presents from his pain management office with abnormal heart rate.  He has never had this before.  When he woke up around 5 AM he had some discomfort in his chest that seems to be a little bit improved.  Feels like a little bit of a pressure in the middle of his chest.  No shortness of breath.  No palpitations or dizziness.  Went to his pain management office and his heart rate was very high so they want to call an ambulance but instead he drove here.  He is on blood pressure medicine for hypertension but denies any cardiac disease. ? ?Home Medications ?Prior to Admission medications   ?Medication Sig Start Date End Date Taking? Authorizing Provider  ?amLODipine (NORVASC) 5 MG tablet  12/22/17   [provider]  ?Ascorbic Acid (VITAMIN C CR) 1000 MG TBCR Take 1 tablet by mouth daily.    [provider]  ?benazepril (LOTENSIN) 20 MG tablet Take 20 mg by mouth daily.    [provider]  ?doxycycline (VIBRAMYCIN) 100 MG capsule Take 100 mg by mouth 2 (two) times daily. 09/21/20   [provider]  ?HYDROcodone-acetaminophen (NORCO) 10-325 MG tablet TAKE 1 TABLET BY MOUTH 4 TIMES DAILY AS NEEDED 02/03/20   [provider]  ?L-ARGININE-500 PO Take 1 tablet by mouth daily.  ?Patient not taking: Reported on 04/12/2021    [provider]  ?Multiple Vitamin (MULTIVITAMIN WITH MINERALS) TABS tablet Take 1 tablet by mouth daily.    [provider]  ?omeprazole (PRILOSEC) 20 MG capsule  09/12/17   [provider]  ?predniSONE (STERAPRED UNI-PAK 48 TAB) 10 MG (48) TBPK tablet Take by mouth daily. 03/01/20   Vickki Hearing, MD  ?tamsulosin (FLOMAX) 0.4 MG CAPS capsule Take 0.4 mg by mouth.    [provider]  ?    ? ?Allergies    ?Poison ivy extract [poison ivy extract]   ? ?Review of Systems   ?Review of Systems  ?Respiratory:  Negative for shortness of breath.   ?Cardiovascular:  Positive for chest pain. Negative for palpitations.  ?Neurological:  Negative for light-headedness.  ? ?Physical Exam ?Updated Vital Signs ?BP (!) 143/82   Pulse 77   Temp 98.2 ?F (36.8 ?C) (Oral)   Resp 12   Ht 5\' 10"  (1.778 m)   Wt 104.3 kg   SpO2 97%   BMI 33.00 kg/m?  ?Physical Exam ?Vitals and nursing note reviewed.  ?Constitutional:   ?   General: He is not in acute distress. ?   Appearance: He is well-developed. He is not ill-appearing or diaphoretic.  ?HENT:  ?   Head: Normocephalic and atraumatic.  ?Cardiovascular:  ?   Rate and Rhythm: Regular rhythm. Tachycardia present.  ?   Heart sounds: Normal heart sounds.  ?Pulmonary:  ?   Effort: Pulmonary effort is normal.  ?   Breath sounds: Normal breath sounds. No wheezing, rhonchi or rales.  ?Abdominal:  ?   General: There is no distension.  ?Skin: ?   General: Skin is warm and dry.  ?Neurological:  ?   Mental Status: He is alert.  ? ? ?ED Results / Procedures / Treatments   ?Labs ?(all labs ordered are  listed, but only abnormal results are displayed) ?Labs Reviewed  ?BASIC METABOLIC PANEL - Abnormal; Notable for the following components:  ?    Result Value  ? Glucose, Bld 127 (*)   ? All other components within normal limits  ?CBC WITH DIFFERENTIAL/PLATELET - Abnormal; Notable for the following components:  ? Lymphs Abs 4.2 (*)   ? All other components within normal limits  ?MAGNESIUM  ? ? ?EKG ?EKG Interpretation ? ?Date/Time:  Tuesday February 05 2022 09:50:36 EDT ?Ventricular Rate:  157 ?PR Interval:  104 ?QRS Duration: 106 ?QT Interval:  307 ?QTC Calculation: 497 ?R Axis:   58 ?Text Interpretation: Supraventricular tachycardia Anterior infarct, old ST depression, probably rate related Baseline wander in lead(s) V3 Confirmed by Pricilla Loveless [phone]) on 02/05/2022 9:54:37 AM ? ? EKG  Interpretation ? ?Date/Time:  Tuesday February 05 2022 10:13:24 EDT ?Ventricular Rate:  84 ?PR Interval:  169 ?QRS Duration: 91 ?QT Interval:  336 ?QTC Calculation: 398 ?R Axis:   73 ?Text Interpretation: Sinus rhythm Probable left atrial enlargement SVT resolved no acute ischemia Confirmed by Pricilla Loveless [phone]) on 02/05/2022 10:38:36 AM ?  ? ?  ?  ?Radiology ?DG Chest Port 1 View ? ?Result Date: 02/05/2022 ?CLINICAL DATA:  Chest pain, tachycardia, hypertension, concern for SVT EXAM: PORTABLE CHEST 1 VIEW COMPARISON:  02/23/2008 FINDINGS: Defibrillator pads overlie the mediastinum and the lower chest. Normal heart size and vascularity. No focal pneumonia, edema pattern, CHF, large effusion or pneumothorax. Trachea midline. No acute osseous finding. IMPRESSION: No active disease. Electronically Signed   By: Judie Petit.  Shick M.D.   On: 02/05/2022 10:52   ? ?Procedures ?Procedures  ? ? ?Medications Ordered in ED ?Medications  ?sodium chloride 0.9 % bolus 1,000 mL (0 mLs Intravenous Stopped 02/05/22 1205)  ?adenosine (ADENOCARD) 6 MG/2ML injection 6 mg (6 mg Intravenous Given 02/05/22 1011)  ? ? ?ED Course/ Medical Decision Making/ A&P ?  ?                        ? ?Presentation is consistent with SVT.  I attempted vagal maneuvers with the patient but this did not help.  Thus we used adenosine which converted him to a normal sinus rhythm.  The mild chest discomfort he was having resolved.  My suspicion is he was not having true ACS or anginal pain but rather was feeling the symptomatic SVT.  Given the chest pain is gone and he had no preceding symptoms of chest pain, I think acute cardiac ischemia is unlikely I do not think troponins will be beneficial as they would likely go up just due to the high rapid heart rate.  Labs reviewed/interpreted and are benign besides mild hyperglycemia.  Chest x-ray reviewed/interpreted, no pneumonia. Has remained stable in ED. Will give referral to cardiology as an outpatient.   ? ? ? ? ? ? ? ?Final Clinical Impression(s) / ED Diagnoses ?Final diagnoses:  ?SVT (supraventricular tachycardia) (HCC)  ? ? ?Rx / DC Orders ?ED Discharge Orders   ? ? None  ? ?  ? ? ?  ?Pricilla Loveless, MD ?02/05/22 1809 ? ?

## 2022-02-14 NOTE — Progress Notes (Signed)
?Cardiology Office Note:   ? ?Date:  02/15/2022  ? ?ID:  AKSHAY SPANG, DOB 31-May-1956, MRN [id number] ? ?PCP:  Toma Deiters, MD ?  ?[facility] Providers ?Cardiologist:  Christell Constant, MD    ? ?Referring MD: Toma Deiters, MD  ? ?CC: f/u ED ?Consulted for the evaluation of SVT at the behest of Toma Deiters, MD ? ?History of Present Illness:   ? ?Melvin Turner is a 66 y.o. male with a hx of HTN with query of SVT. ? ?Patient notes that he is feeling fine: he notes that he had recent sudden onset palpitations.  He notes that he had a little bit of chest pain.  Has felt this before (2-3 times in the last several years).  Could not feel his heart was going fast.  Incidentally found to have SVT at [facility].  Micah Flesher to [facility] ED and got adenosine and had a head rush.  Has had no symptoms since  ? ?No chest pain, shortness of breath, palpitations.  Syncope or near syncope. ? ? ?Past Medical History:  ?Diagnosis Date  ? Arthritis   ? BPH (benign prostatic hyperplasia)   ? GERD (gastroesophageal reflux disease)   ? Hypertension   ? ? ?Past Surgical History:  ?Procedure Laterality Date  ? COLONOSCOPY    ? KNEE ARTHROSCOPY Right 01/31/2015  ? Procedure: RIGHT KNEE ARTHROSCOPY, PARTIAL MEDIAL MENISECTOMY;  Surgeon: Darreld Mclean, MD;  Location: [facility];  Service: Orthopedics;  Laterality: Right;  ? TONSILLECTOMY    ? TOTAL KNEE ARTHROPLASTY Right 02/03/2018  ? Procedure: TOTAL KNEE ARTHROPLASTY;  Surgeon: Vickki Hearing, MD;  Location: [facility];  Service: Orthopedics;  Laterality: Right;  ? TOTAL KNEE ARTHROPLASTY Left 10/05/2019  ? Procedure: LEFT TOTAL KNEE ARTHROPLASTY;  Surgeon: Vickki Hearing, MD;  Location: [facility];  Service: Orthopedics;  Laterality: Left;  ? VASECTOMY    ? ? ?Current Medications: ?Current Meds  ?Medication Sig  ? amLODipine (NORVASC) 5 MG tablet   ? Ascorbic Acid (VITAMIN C CR) 1000 MG TBCR Take 1 tablet by mouth daily.  ? aspirin EC 81 MG tablet Take 81 mg by mouth daily.  Swallow whole.  ? benazepril (LOTENSIN) 20 MG tablet Take 20 mg by mouth daily.  ? HYDROcodone-acetaminophen (NORCO) 10-325 MG tablet TAKE 1 TABLET BY MOUTH 4 TIMES DAILY AS NEEDED  ? L-ARGININE-500 PO Take 1 tablet by mouth daily.  ? Multiple Vitamin (MULTIVITAMIN WITH MINERALS) TABS tablet Take 1 tablet by mouth daily.  ? omeprazole (PRILOSEC) 20 MG capsule 20 mg daily.  ? tamsulosin (FLOMAX) 0.4 MG CAPS capsule Take 0.4 mg by mouth.  ?  ? ?Allergies:   Poison ivy extract [poison ivy extract]  ? ?Social History  ? ?Socioeconomic History  ? Marital status: Married  ?  Spouse name: Not on file  ? Number of children: Not on file  ? Years of education: Not on file  ? Highest education level: Not on file  ?Occupational History  ? Not on file  ?Tobacco Use  ? Smoking status: Former  ?  Packs/day: 1.00  ?  Years: 30.00  ?  Pack years: 30.00  ?  Types: Cigarettes  ?  Quit date: 01/29/2006  ?  Years since quitting: 16.0  ? Smokeless tobacco: Never  ?Vaping Use  ? Vaping Use: Never used  ?Substance and Sexual Activity  ? Alcohol use: Yes  ?  Alcohol/week: 3.0 standard drinks  ?  Types:  3 Cans of beer per week  ?  Comment: occ  ? Drug use: No  ? Sexual activity: Yes  ?  Birth control/protection: None  ?Other Topics Concern  ? Not on file  ?Social History Narrative  ? Not on file  ? ?Social Determinants of Health  ? ?Financial Resource Strain: Not on file  ?Food Insecurity: Not on file  ?Transportation Needs: Not on file  ?Physical Activity: Not on file  ?Stress: Not on file  ?Social Connections: Not on file  ? 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 ?Family History: ?The patient's family history includes Coronary artery disease in his father; Heart attack in his son; Hypertension in his father and mother. ?Died of enlarged heart NOS at the age of 59. ?Sister died of a brain aneurysm in 74. ? ?ROS:   ?Please see the history of present illness.    ?All other systems reviewed and are  negative. ? ?EKGs/Labs/Other Studies Reviewed:   ? ? ?Recent Labs: ?02/05/2022: BUN 16; Creatinine, Ser 0.80; Hemoglobin 16.6; Magnesium 2.1; Platelets 268; Potassium 4.1; Sodium 140  ?Recent Lipid Panel ?No results found for: CHOL, TRIG, HDL, CHOLHDL, VLDL, LDLCALC, LDLDIRECT ? ?    ? ?Physical Exam:   ? ?VS:  BP 122/70   Pulse 68   Ht 5\' 11"  (1.803 m)   Wt 230 lb (104.3 kg)   SpO2 95%   BMI 32.08 kg/m?    ? ?Wt Readings from Last 3 Encounters:  ?02/15/22 230 lb (104.3 kg)  ?02/05/22 230 lb (104.3 kg)  ?04/12/21 226 lb 6 oz (102.7 kg)  ?  ?Gen: no distress ?Neck: No JVD ?Cardiac: No Rubs or Gallops, no Murmur or with Valsalva, RRR +2 radial pulses ?Respiratory: Clear to auscultation bilaterally, normal effort, normal  respiratory rate ?GI: Soft, nontender, non-distended  ?MS: No  edema;  moves all extremities ?Integument: Skin feels warm ?Neuro:  At time of evaluation, alert and oriented to person/place/time/situation  ?Psych: Normal affect, patient feels fine ? ? ?ASSESSMENT:   ? ?No diagnosis found. ?PLAN:   ? ?SVT- sx are CP and  palpitations ?Family history of SCD ?HTN ?- Potentially AVNRT ?- given potential fatigue of standing BB, we discussed 12.5 mg PO metoprolol PRN palpitations ?- will get and echo ?- when we call about the result of his echo if he has need more metoprolol will stop his norvasc and start metoprolol standing ? ?One year ? ?   ? ? ? ? ?Medication Adjustments/Labs and Tests Ordered: ?Current medicines are reviewed at length with the patient today.  Concerns regarding medicines are outlined above.  ?No orders of the defined types were placed in this encounter. ? ?No orders of the defined types were placed in this encounter. ? ? ?There are no Patient Instructions on file for this visit.  ? ?Signed, ?04/14/21, MD  ?02/15/2022 11:27 AM    ?[facility] ? ?

## 2022-02-15 ENCOUNTER — Encounter: Payer: Self-pay | Admitting: Internal Medicine

## 2022-02-15 ENCOUNTER — Ambulatory Visit (INDEPENDENT_AMBULATORY_CARE_PROVIDER_SITE_OTHER): Payer: No Typology Code available for payment source | Admitting: Internal Medicine

## 2022-02-15 VITALS — BP 122/70 | HR 68 | Ht 71.0 in | Wt 230.0 lb

## 2022-02-15 DIAGNOSIS — I471 Supraventricular tachycardia: Secondary | ICD-10-CM | POA: Diagnosis not present

## 2022-02-15 DIAGNOSIS — R002 Palpitations: Secondary | ICD-10-CM | POA: Diagnosis not present

## 2022-02-15 DIAGNOSIS — R079 Chest pain, unspecified: Secondary | ICD-10-CM | POA: Diagnosis not present

## 2022-02-15 MED ORDER — METOPROLOL TARTRATE 25 MG PO TABS: 12.5000 mg | ORAL_TABLET | Freq: Four times a day (QID) | ORAL | 3 refills | Status: DC | PRN

## 2022-02-15 NOTE — Patient Instructions (Addendum)
Medication Instructions:  ?Start Metoprolol 12.5 mg tablets every 6 hours as needed for palpitations.  ? ?Testing/Procedures: ?Your physician has requested that you have an echocardiogram. Echocardiography is a painless test that uses sound waves to create images of your heart. It provides your doctor with information about the size and shape of your heart and how well your heart?s chambers and valves are working. This procedure takes approximately one hour. There are no restrictions for this procedure. ? ?Follow-Up: ?Follow up in 1 year with Dr. Izora Ribas.  ? ?Any Other Special Instructions Will Be Listed Below (If Applicable). ? ? ? ? ?If you need a refill on your cardiac medications before your next appointment, please call your pharmacy. ? ?

## 2022-03-01 ENCOUNTER — Ambulatory Visit (HOSPITAL_COMMUNITY)
Admission: RE | Admit: 2022-03-01 | Discharge: 2022-03-01 | Disposition: A | Discharge status: Home / Self Care | Payer: PRIVATE HEALTH INSURANCE | Source: Ambulatory Visit | Attending: Internal Medicine | Admitting: Internal Medicine

## 2022-03-01 DIAGNOSIS — R079 Chest pain, unspecified: Secondary | ICD-10-CM | POA: Diagnosis not present

## 2022-03-01 DIAGNOSIS — R002 Palpitations: Secondary | ICD-10-CM | POA: Diagnosis not present

## 2022-03-01 LAB — ECHOCARDIOGRAM COMPLETE
AR max vel: 3.01 cm2
AV Area VTI: 3.06 cm2
AV Area mean vel: 2.8 cm2
AV Mean grad: 6 mmHg
AV Peak grad: 12.3 mmHg
Ao pk vel: 1.75 m/s
Area-P 1/2: 3.06 cm2
Calc EF: 66.9 %
MV VTI: 2.89 cm2
S' Lateral: 3.4 cm
Single Plane A2C EF: 68.1 %
Single Plane A4C EF: 68 %

## 2022-03-01 NOTE — Progress Notes (Signed)
*  PRELIMINARY RESULTS* ?Echocardiogram ?2D Echocardiogram has been performed. ? ?[name] ?03/01/2022, 10:36 AM ?

## 2022-03-06 ENCOUNTER — Telehealth: Payer: Self-pay | Admitting: Internal Medicine

## 2022-03-06 NOTE — Telephone Encounter (Signed)
Spoke to pt who verbalized understanding of results.Pt had no questions or concerns at this time. ?

## 2022-03-06 NOTE — Telephone Encounter (Signed)
-----   Message from Macie Burows, RN sent at 03/05/2022  8:43 AM EDT ----- ? ?----- Message ----- ?From: Christell Constant, MD ?Sent: 03/03/2022   3:15 PM EDT ?To: Macie Burows, RN ? ?Results: ?Mild TAA ?Plan: ?Echo in one year prior to one year f/u ?Succinate 25 if lots of tartrate use ? ?Christell Constant, MD ? ? ?

## 2022-03-06 NOTE — Telephone Encounter (Signed)
Patient returned call. Transferred to CMA.  ?

## 2023-01-16 ENCOUNTER — Encounter: Payer: Self-pay | Admitting: Radiology

## 2023-03-04 DIAGNOSIS — R030 Elevated blood-pressure reading, without diagnosis of hypertension: Secondary | ICD-10-CM | POA: Diagnosis not present

## 2023-03-04 DIAGNOSIS — G894 Chronic pain syndrome: Secondary | ICD-10-CM | POA: Diagnosis not present

## 2023-03-04 DIAGNOSIS — F1120 Opioid dependence, uncomplicated: Secondary | ICD-10-CM | POA: Diagnosis not present

## 2023-03-04 DIAGNOSIS — M25561 Pain in right knee: Secondary | ICD-10-CM | POA: Diagnosis not present

## 2023-03-04 DIAGNOSIS — Z6834 Body mass index (BMI) 34.0-34.9, adult: Secondary | ICD-10-CM | POA: Diagnosis not present

## 2023-03-04 DIAGNOSIS — E669 Obesity, unspecified: Secondary | ICD-10-CM | POA: Diagnosis not present

## 2023-04-02 DIAGNOSIS — Z79899 Other long term (current) drug therapy: Secondary | ICD-10-CM | POA: Diagnosis not present

## 2023-04-02 DIAGNOSIS — F1120 Opioid dependence, uncomplicated: Secondary | ICD-10-CM | POA: Diagnosis not present

## 2023-04-02 DIAGNOSIS — R030 Elevated blood-pressure reading, without diagnosis of hypertension: Secondary | ICD-10-CM | POA: Diagnosis not present

## 2023-04-02 DIAGNOSIS — G8929 Other chronic pain: Secondary | ICD-10-CM | POA: Diagnosis not present

## 2023-04-02 DIAGNOSIS — E669 Obesity, unspecified: Secondary | ICD-10-CM | POA: Diagnosis not present

## 2023-04-02 DIAGNOSIS — Z6835 Body mass index (BMI) 35.0-35.9, adult: Secondary | ICD-10-CM | POA: Diagnosis not present

## 2023-04-02 DIAGNOSIS — M25561 Pain in right knee: Secondary | ICD-10-CM | POA: Diagnosis not present

## 2023-04-16 DIAGNOSIS — M5459 Other low back pain: Secondary | ICD-10-CM | POA: Diagnosis not present

## 2023-04-16 DIAGNOSIS — N400 Enlarged prostate without lower urinary tract symptoms: Secondary | ICD-10-CM | POA: Diagnosis not present

## 2023-04-16 DIAGNOSIS — Z125 Encounter for screening for malignant neoplasm of prostate: Secondary | ICD-10-CM | POA: Diagnosis not present

## 2023-04-16 DIAGNOSIS — I10 Essential (primary) hypertension: Secondary | ICD-10-CM | POA: Diagnosis not present

## 2023-04-16 DIAGNOSIS — K219 Gastro-esophageal reflux disease without esophagitis: Secondary | ICD-10-CM | POA: Diagnosis not present

## 2023-05-01 DIAGNOSIS — R030 Elevated blood-pressure reading, without diagnosis of hypertension: Secondary | ICD-10-CM | POA: Diagnosis not present

## 2023-05-01 DIAGNOSIS — M1990 Unspecified osteoarthritis, unspecified site: Secondary | ICD-10-CM | POA: Diagnosis not present

## 2023-05-01 DIAGNOSIS — M47816 Spondylosis without myelopathy or radiculopathy, lumbar region: Secondary | ICD-10-CM | POA: Diagnosis not present

## 2023-05-01 DIAGNOSIS — G8929 Other chronic pain: Secondary | ICD-10-CM | POA: Diagnosis not present

## 2023-05-01 DIAGNOSIS — Z6835 Body mass index (BMI) 35.0-35.9, adult: Secondary | ICD-10-CM | POA: Diagnosis not present

## 2023-05-01 DIAGNOSIS — M545 Low back pain, unspecified: Secondary | ICD-10-CM | POA: Diagnosis not present

## 2023-05-28 DIAGNOSIS — M1990 Unspecified osteoarthritis, unspecified site: Secondary | ICD-10-CM | POA: Diagnosis not present

## 2023-05-28 DIAGNOSIS — Z6835 Body mass index (BMI) 35.0-35.9, adult: Secondary | ICD-10-CM | POA: Diagnosis not present

## 2023-05-28 DIAGNOSIS — G8929 Other chronic pain: Secondary | ICD-10-CM | POA: Diagnosis not present

## 2023-05-28 DIAGNOSIS — M47816 Spondylosis without myelopathy or radiculopathy, lumbar region: Secondary | ICD-10-CM | POA: Diagnosis not present

## 2023-05-28 DIAGNOSIS — R030 Elevated blood-pressure reading, without diagnosis of hypertension: Secondary | ICD-10-CM | POA: Diagnosis not present

## 2023-05-28 DIAGNOSIS — M545 Low back pain, unspecified: Secondary | ICD-10-CM | POA: Diagnosis not present

## 2023-06-30 DIAGNOSIS — Z79891 Long term (current) use of opiate analgesic: Secondary | ICD-10-CM | POA: Diagnosis not present

## 2023-06-30 DIAGNOSIS — M47816 Spondylosis without myelopathy or radiculopathy, lumbar region: Secondary | ICD-10-CM | POA: Diagnosis not present

## 2023-06-30 DIAGNOSIS — M5136 Other intervertebral disc degeneration, lumbar region: Secondary | ICD-10-CM | POA: Diagnosis not present

## 2023-06-30 DIAGNOSIS — Z79899 Other long term (current) drug therapy: Secondary | ICD-10-CM | POA: Diagnosis not present

## 2023-07-17 DIAGNOSIS — K219 Gastro-esophageal reflux disease without esophagitis: Secondary | ICD-10-CM | POA: Diagnosis not present

## 2023-07-17 DIAGNOSIS — N400 Enlarged prostate without lower urinary tract symptoms: Secondary | ICD-10-CM | POA: Diagnosis not present

## 2023-07-17 DIAGNOSIS — I10 Essential (primary) hypertension: Secondary | ICD-10-CM | POA: Diagnosis not present

## 2023-07-17 DIAGNOSIS — M5459 Other low back pain: Secondary | ICD-10-CM | POA: Diagnosis not present

## 2023-08-25 DIAGNOSIS — Z79891 Long term (current) use of opiate analgesic: Secondary | ICD-10-CM | POA: Diagnosis not present

## 2023-09-11 DIAGNOSIS — K080 Exfoliation of teeth due to systemic causes: Secondary | ICD-10-CM | POA: Diagnosis not present

## 2023-10-09 DIAGNOSIS — Z0000 Encounter for general adult medical examination without abnormal findings: Secondary | ICD-10-CM | POA: Diagnosis not present

## 2023-10-09 DIAGNOSIS — M184 Other bilateral secondary osteoarthritis of first carpometacarpal joints: Secondary | ICD-10-CM | POA: Diagnosis not present

## 2023-10-09 DIAGNOSIS — N400 Enlarged prostate without lower urinary tract symptoms: Secondary | ICD-10-CM | POA: Diagnosis not present

## 2023-10-09 DIAGNOSIS — I10 Essential (primary) hypertension: Secondary | ICD-10-CM | POA: Diagnosis not present

## 2023-10-09 DIAGNOSIS — M5459 Other low back pain: Secondary | ICD-10-CM | POA: Diagnosis not present

## 2023-11-03 DIAGNOSIS — M25569 Pain in unspecified knee: Secondary | ICD-10-CM | POA: Diagnosis not present

## 2023-11-03 DIAGNOSIS — G894 Chronic pain syndrome: Secondary | ICD-10-CM | POA: Diagnosis not present

## 2023-11-03 DIAGNOSIS — Z79891 Long term (current) use of opiate analgesic: Secondary | ICD-10-CM | POA: Diagnosis not present

## 2023-11-03 DIAGNOSIS — M47816 Spondylosis without myelopathy or radiculopathy, lumbar region: Secondary | ICD-10-CM | POA: Diagnosis not present

## 2023-12-01 DIAGNOSIS — M25569 Pain in unspecified knee: Secondary | ICD-10-CM | POA: Diagnosis not present

## 2023-12-01 DIAGNOSIS — M47816 Spondylosis without myelopathy or radiculopathy, lumbar region: Secondary | ICD-10-CM | POA: Diagnosis not present

## 2023-12-01 DIAGNOSIS — Z79891 Long term (current) use of opiate analgesic: Secondary | ICD-10-CM | POA: Diagnosis not present

## 2023-12-01 DIAGNOSIS — G894 Chronic pain syndrome: Secondary | ICD-10-CM | POA: Diagnosis not present

## 2024-01-06 DIAGNOSIS — M25569 Pain in unspecified knee: Secondary | ICD-10-CM | POA: Diagnosis not present

## 2024-01-06 DIAGNOSIS — G894 Chronic pain syndrome: Secondary | ICD-10-CM | POA: Diagnosis not present

## 2024-01-06 DIAGNOSIS — Z96653 Presence of artificial knee joint, bilateral: Secondary | ICD-10-CM | POA: Diagnosis not present

## 2024-01-06 DIAGNOSIS — M47816 Spondylosis without myelopathy or radiculopathy, lumbar region: Secondary | ICD-10-CM | POA: Diagnosis not present

## 2024-01-06 DIAGNOSIS — Z79891 Long term (current) use of opiate analgesic: Secondary | ICD-10-CM | POA: Diagnosis not present

## 2024-01-15 DIAGNOSIS — I10 Essential (primary) hypertension: Secondary | ICD-10-CM | POA: Diagnosis not present

## 2024-01-15 DIAGNOSIS — Z0000 Encounter for general adult medical examination without abnormal findings: Secondary | ICD-10-CM | POA: Diagnosis not present

## 2024-01-15 DIAGNOSIS — M184 Other bilateral secondary osteoarthritis of first carpometacarpal joints: Secondary | ICD-10-CM | POA: Diagnosis not present

## 2024-01-15 DIAGNOSIS — M5459 Other low back pain: Secondary | ICD-10-CM | POA: Diagnosis not present

## 2024-01-15 DIAGNOSIS — N400 Enlarged prostate without lower urinary tract symptoms: Secondary | ICD-10-CM | POA: Diagnosis not present

## 2024-02-03 ENCOUNTER — Ambulatory Visit: Payer: No Typology Code available for payment source | Attending: Internal Medicine | Admitting: Internal Medicine

## 2024-02-03 VITALS — BP 138/82 | HR 92 | Ht 70.0 in | Wt 243.2 lb

## 2024-02-03 DIAGNOSIS — I4819 Other persistent atrial fibrillation: Secondary | ICD-10-CM

## 2024-02-03 DIAGNOSIS — I10 Essential (primary) hypertension: Secondary | ICD-10-CM

## 2024-02-03 DIAGNOSIS — Z7901 Long term (current) use of anticoagulants: Secondary | ICD-10-CM | POA: Insufficient documentation

## 2024-02-03 DIAGNOSIS — I471 Supraventricular tachycardia, unspecified: Secondary | ICD-10-CM | POA: Diagnosis not present

## 2024-02-03 DIAGNOSIS — I4891 Unspecified atrial fibrillation: Secondary | ICD-10-CM

## 2024-02-03 DIAGNOSIS — G4733 Obstructive sleep apnea (adult) (pediatric): Secondary | ICD-10-CM | POA: Diagnosis not present

## 2024-02-03 MED ORDER — AMLODIPINE BESYLATE 2.5 MG PO TABS: 2.5000 mg | ORAL_TABLET | Freq: Every day | ORAL | 5 refills | Status: DC

## 2024-02-03 MED ORDER — METOPROLOL TARTRATE 25 MG PO TABS: 25.0000 mg | ORAL_TABLET | Freq: Two times a day (BID) | ORAL | 3 refills | Status: DC

## 2024-02-03 NOTE — Patient Instructions (Addendum)
 Medication Instructions:  Your physician has recommended you make the following change in your medication:  Start taking Metoprolol Tartrate 25 mg twice daily Decrease Amlodipine from 5 mg to 2.5 mg once daily Stop Aspirin  Continue all other medications as prescribed   Labwork: None  Testing/Procedures: Your physician has requested that you have an echocardiogram. Echocardiography is a painless test that uses sound waves to create images of your heart. It provides your doctor with information about the size and shape of your heart and how well your heart's chambers and valves are working. This procedure takes approximately one hour. There are no restrictions for this procedure. Please do NOT wear cologne, perfume, aftershave, or lotions (deodorant is allowed). Please arrive 15 minutes prior to your appointment time.  Please note: We ask at that you not bring children with you during ultrasound (echo/ vascular) testing. Due to room size and safety concerns, children are not allowed in the ultrasound rooms during exams. Our front office staff cannot provide observation of children in our lobby area while testing is being conducted. An adult accompanying a patient to their appointment will only be allowed in the ultrasound room at the discretion of the ultrasound technician under special circumstances. We apologize for any inconvenience.  Your physician has recommended that you have a Cardioversion (DCCV). Electrical Cardioversion uses a jolt of electricity to your heart either through paddles or wired patches attached to your chest. This is a controlled, usually prescheduled, procedure. Defibrillation is done under light anesthesia in the hospital, and you usually go home the day of the procedure. This is done to get your heart back into a normal rhythm. You are not awake for the procedure. Please see the instruction sheet given to you today.   Follow-Up: Your physician recommends that you schedule  a follow-up appointment in: 1 month after DCCV   Any Other Special Instructions Will Be Listed Below (If Applicable). Referral to Sleep Medicine   Thank you for choosing  [facility]!     If you need a refill on your cardiac medications before your next appointment, please call your pharmacy.

## 2024-02-03 NOTE — Progress Notes (Signed)
 Cardiology Office Note  Date: 02/03/2024   ID: Melvin Turner, DOB 13-Oct-1956, MRN [id number]  PCP:  Toma Deiters, MD  Cardiologist:  Christell Constant, MD Electrophysiologist:  None   History of Present Illness: Melvin Turner is a 68 y.o. male known to have history of SVT, HTN is here for follow-up visit.  EKG today showed atrial fibrillation with frequent PVCs, HR 93 bpm.  He does report occasional SOB, palpitations and chest pain but not frequently.  He does have fatigue for a long time.  No other symptoms of dizziness, syncope or leg swelling.  He was seen by his PCP recently around 2 weeks ago and started him on Eliquis.  He is currently taking metoprolol as needed basis but has not taken any pills in the last 2 years.  Echocardiogram in 2023 showed normal LVEF, no valvular heart disease.  Past Medical History:  Diagnosis Date   Arthritis    BPH (benign prostatic hyperplasia)    GERD (gastroesophageal reflux disease)    Hypertension     Past Surgical History:  Procedure Laterality Date   COLONOSCOPY     KNEE ARTHROSCOPY Right 01/31/2015   Procedure: RIGHT KNEE ARTHROSCOPY, PARTIAL MEDIAL MENISECTOMY;  Surgeon: Darreld Mclean, MD;  Location: [facility];  Service: Orthopedics;  Laterality: Right;   TONSILLECTOMY     TOTAL KNEE ARTHROPLASTY Right 02/03/2018   Procedure: TOTAL KNEE ARTHROPLASTY;  Surgeon: Vickki Hearing, MD;  Location: [facility];  Service: Orthopedics;  Laterality: Right;   TOTAL KNEE ARTHROPLASTY Left 10/05/2019   Procedure: LEFT TOTAL KNEE ARTHROPLASTY;  Surgeon: Vickki Hearing, MD;  Location: [facility];  Service: Orthopedics;  Laterality: Left;   VASECTOMY      Current Outpatient Medications  Medication Sig Dispense Refill   amLODipine (NORVASC) 5 MG tablet      Ascorbic Acid (VITAMIN C CR) 1000 MG TBCR Take 1 tablet by mouth daily.     aspirin EC 81 MG tablet Take 81 mg by mouth daily. Swallow whole.     benazepril (LOTENSIN) 20 MG tablet Take 20  mg by mouth daily.     HYDROcodone-acetaminophen (NORCO) 10-325 MG tablet TAKE 1 TABLET BY MOUTH 4 TIMES DAILY AS NEEDED     L-ARGININE-500 PO Take 1 tablet by mouth daily.     metoprolol tartrate (LOPRESSOR) 25 MG tablet Take 0.5 tablets (12.5 mg total) by mouth every 6 (six) hours as needed. 90 tablet 3   Multiple Vitamin (MULTIVITAMIN WITH MINERALS) TABS tablet Take 1 tablet by mouth daily.     omeprazole (PRILOSEC) 20 MG capsule 20 mg daily.     tamsulosin (FLOMAX) 0.4 MG CAPS capsule Take 0.4 mg by mouth.     No current facility-administered medications for this visit.   Allergies:  Poison ivy extract [poison ivy extract]   Social History: The patient  reports that he quit smoking about 18 years ago. His smoking use included cigarettes. He started smoking about 48 years ago. He has a 30 pack-year smoking history. He has never used smokeless tobacco. He reports current alcohol use of about 3.0 standard drinks of alcohol per week. He reports that he does not use drugs.   Family History: The patient's family history includes Coronary artery disease in his father; Heart attack in his son; Hypertension in his father and mother.   ROS:  Please see the history of present illness. Otherwise, complete review of systems is positive for none  All other systems  are reviewed and negative.   Physical Exam: VS:  There were no vitals taken for this visit., BMI There is no height or weight on file to calculate BMI.  Wt Readings from Last 3 Encounters:  02/15/22 230 lb (104.3 kg)  02/05/22 230 lb (104.3 kg)  04/12/21 226 lb 6 oz (102.7 kg)    General: Patient appears comfortable at rest. HEENT: Conjunctiva and lids normal, oropharynx clear with moist mucosa. Neck: Supple, no elevated JVP or carotid bruits, no thyromegaly. Lungs: Clear to auscultation, nonlabored breathing at rest. Cardiac: Regular rate and rhythm, no S3 or significant systolic murmur, no pericardial rub. Abdomen: Soft, nontender,  no hepatomegaly, bowel sounds present, no guarding or rebound. Extremities: No pitting edema, distal pulses 2+. Skin: Warm and dry. Musculoskeletal: No kyphosis. Neuropsychiatric: Alert and oriented x3, affect grossly appropriate.  Recent Labwork: No results found for requested labs within last 365 days.  No results found for: "CHOL", "TRIG", "HDL", "CHOLHDL", "VLDL", "LDLCALC", "LDLDIRECT"    Assessment and Plan:  Persistent A-fib: EKG today showed A-fib with frequent PVCs, HR 93 bpm.  Start metoprolol tartrate 25 mg twice daily and continue Eliquis 5 mg daily.  He does have occasional SOB, palpitations, chronic fatigue but due to new onset atrial fibrillation, he will benefit from DCCV to restore normal sinus rhythm.  Will schedule in the next 3 to 4 weeks.  He is aware that he should be compliant with systemic AC prior to the DCCV procedure.  Echocardiogram in 2023 showed normal LVEF and no valvular dysfunction.  Will update echocardiogram.  Will refer to sleep medicine for evaluation of OSA.  HTN, controlled: Will decrease amlodipine dose from 5 mg to 2.5 mg once daily to accommodate metoprolol initiation.  Continue remaining antihypertensives, olmesartan 40 mg once daily.   Informed consent for DCCV The risks and benefits of the procedure DCCV are explained to the patient.  The risks include, but not limited to, chest pain, infection, pneumonia, cardiac arrest, possibly PPM implantation.       Medication Adjustments/Labs and Tests Ordered: Current medicines are reviewed at length with the patient today.  Concerns regarding medicines are outlined above.    Disposition:  Follow up  1 month after DCCV  Signed Toma Erichsen Verne Spurr, MD, 02/03/2024 9:40 AM    [facility] at [facility] [address] [place], [place], [place] [address]

## 2024-02-03 NOTE — H&P (View-Only) (Signed)
 Cardiology Office Note  Date: 02/03/2024   ID: Melvin Turner, DOB 13-Oct-1956, MRN [id number]  PCP:  Toma Deiters, MD  Cardiologist:  Christell Constant, MD Electrophysiologist:  None   History of Present Illness: Melvin Turner is a 68 y.o. male known to have history of SVT, HTN is here for follow-up visit.  EKG today showed atrial fibrillation with frequent PVCs, HR 93 bpm.  He does report occasional SOB, palpitations and chest pain but not frequently.  He does have fatigue for a long time.  No other symptoms of dizziness, syncope or leg swelling.  He was seen by his PCP recently around 2 weeks ago and started him on Eliquis.  He is currently taking metoprolol as needed basis but has not taken any pills in the last 2 years.  Echocardiogram in 2023 showed normal LVEF, no valvular heart disease.  Past Medical History:  Diagnosis Date   Arthritis    BPH (benign prostatic hyperplasia)    GERD (gastroesophageal reflux disease)    Hypertension     Past Surgical History:  Procedure Laterality Date   COLONOSCOPY     KNEE ARTHROSCOPY Right 01/31/2015   Procedure: RIGHT KNEE ARTHROSCOPY, PARTIAL MEDIAL MENISECTOMY;  Surgeon: Darreld Mclean, MD;  Location: [facility];  Service: Orthopedics;  Laterality: Right;   TONSILLECTOMY     TOTAL KNEE ARTHROPLASTY Right 02/03/2018   Procedure: TOTAL KNEE ARTHROPLASTY;  Surgeon: Vickki Hearing, MD;  Location: [facility];  Service: Orthopedics;  Laterality: Right;   TOTAL KNEE ARTHROPLASTY Left 10/05/2019   Procedure: LEFT TOTAL KNEE ARTHROPLASTY;  Surgeon: Vickki Hearing, MD;  Location: [facility];  Service: Orthopedics;  Laterality: Left;   VASECTOMY      Current Outpatient Medications  Medication Sig Dispense Refill   amLODipine (NORVASC) 5 MG tablet      Ascorbic Acid (VITAMIN C CR) 1000 MG TBCR Take 1 tablet by mouth daily.     aspirin EC 81 MG tablet Take 81 mg by mouth daily. Swallow whole.     benazepril (LOTENSIN) 20 MG tablet Take 20  mg by mouth daily.     HYDROcodone-acetaminophen (NORCO) 10-325 MG tablet TAKE 1 TABLET BY MOUTH 4 TIMES DAILY AS NEEDED     L-ARGININE-500 PO Take 1 tablet by mouth daily.     metoprolol tartrate (LOPRESSOR) 25 MG tablet Take 0.5 tablets (12.5 mg total) by mouth every 6 (six) hours as needed. 90 tablet 3   Multiple Vitamin (MULTIVITAMIN WITH MINERALS) TABS tablet Take 1 tablet by mouth daily.     omeprazole (PRILOSEC) 20 MG capsule 20 mg daily.     tamsulosin (FLOMAX) 0.4 MG CAPS capsule Take 0.4 mg by mouth.     No current facility-administered medications for this visit.   Allergies:  Poison ivy extract [poison ivy extract]   Social History: The patient  reports that he quit smoking about 18 years ago. His smoking use included cigarettes. He started smoking about 48 years ago. He has a 30 pack-year smoking history. He has never used smokeless tobacco. He reports current alcohol use of about 3.0 standard drinks of alcohol per week. He reports that he does not use drugs.   Family History: The patient's family history includes Coronary artery disease in his father; Heart attack in his son; Hypertension in his father and mother.   ROS:  Please see the history of present illness. Otherwise, complete review of systems is positive for none  All other systems  are reviewed and negative.   Physical Exam: VS:  There were no vitals taken for this visit., BMI There is no height or weight on file to calculate BMI.  Wt Readings from Last 3 Encounters:  02/15/22 230 lb (104.3 kg)  02/05/22 230 lb (104.3 kg)  04/12/21 226 lb 6 oz (102.7 kg)    General: Patient appears comfortable at rest. HEENT: Conjunctiva and lids normal, oropharynx clear with moist mucosa. Neck: Supple, no elevated JVP or carotid bruits, no thyromegaly. Lungs: Clear to auscultation, nonlabored breathing at rest. Cardiac: Regular rate and rhythm, no S3 or significant systolic murmur, no pericardial rub. Abdomen: Soft, nontender,  no hepatomegaly, bowel sounds present, no guarding or rebound. Extremities: No pitting edema, distal pulses 2+. Skin: Warm and dry. Musculoskeletal: No kyphosis. Neuropsychiatric: Alert and oriented x3, affect grossly appropriate.  Recent Labwork: No results found for requested labs within last 365 days.  No results found for: "CHOL", "TRIG", "HDL", "CHOLHDL", "VLDL", "LDLCALC", "LDLDIRECT"    Assessment and Plan:  Persistent A-fib: EKG today showed A-fib with frequent PVCs, HR 93 bpm.  Start metoprolol tartrate 25 mg twice daily and continue Eliquis 5 mg daily.  He does have occasional SOB, palpitations, chronic fatigue but due to new onset atrial fibrillation, he will benefit from DCCV to restore normal sinus rhythm.  Will schedule in the next 3 to 4 weeks.  He is aware that he should be compliant with systemic AC prior to the DCCV procedure.  Echocardiogram in 2023 showed normal LVEF and no valvular dysfunction.  Will update echocardiogram.  Will refer to sleep medicine for evaluation of OSA.  HTN, controlled: Will decrease amlodipine dose from 5 mg to 2.5 mg once daily to accommodate metoprolol initiation.  Continue remaining antihypertensives, olmesartan 40 mg once daily.   Informed consent for DCCV The risks and benefits of the procedure DCCV are explained to the patient.  The risks include, but not limited to, chest pain, infection, pneumonia, cardiac arrest, possibly PPM implantation.       Medication Adjustments/Labs and Tests Ordered: Current medicines are reviewed at length with the patient today.  Concerns regarding medicines are outlined above.    Disposition:  Follow up  1 month after DCCV  Signed Toma Erichsen Verne Spurr, MD, 02/03/2024 9:40 AM    [facility] at [facility] [address] [place], [place], [place] [address]

## 2024-02-05 ENCOUNTER — Telehealth: Payer: Self-pay | Admitting: Internal Medicine

## 2024-02-05 NOTE — Telephone Encounter (Signed)
 Patient states he's returning a call he received today from the office. Was unable to find documentation of who or why patient was contacted.   Please advise.

## 2024-02-05 NOTE — Telephone Encounter (Signed)
 Called patient to let him know his Cardioversion scheduled. Pre-op date is April 11 @ 10:00. And Procedure date is April 15 @ 9:00. Patient verbalized understanding

## 2024-02-18 ENCOUNTER — Ambulatory Visit: Attending: Internal Medicine

## 2024-02-18 DIAGNOSIS — K080 Exfoliation of teeth due to systemic causes: Secondary | ICD-10-CM | POA: Diagnosis not present

## 2024-02-18 DIAGNOSIS — I4891 Unspecified atrial fibrillation: Secondary | ICD-10-CM | POA: Diagnosis not present

## 2024-02-18 LAB — ECHOCARDIOGRAM COMPLETE
AR max vel: 2.36 cm2
AV Area VTI: 2.44 cm2
AV Area mean vel: 2.36 cm2
AV Mean grad: 6 mmHg
AV Peak grad: 9.7 mmHg
Ao pk vel: 1.56 m/s
Calc EF: 59.6 %
MV VTI: 3.72 cm2
S' Lateral: 3.3 cm
Single Plane A2C EF: 59.9 %
Single Plane A4C EF: 60.8 %

## 2024-02-18 NOTE — Progress Notes (Signed)
 Patient ID: Melvin Turner, male   DOB: [birth year], 68 y.o.   MRN: [id number] IV started at 1015 for use of Definity.  After flushing IV patient complained of arm hurting and requested for IV to be removed.  IV taken out immediately and no Definity was administered.  No bruising or swelling noted, patient given ice pack.

## 2024-02-25 DIAGNOSIS — K080 Exfoliation of teeth due to systemic causes: Secondary | ICD-10-CM | POA: Diagnosis not present

## 2024-02-25 NOTE — Patient Instructions (Signed)
 Melvin Turner  02/25/2024     @PREFPERIOPPHARMACY @   Your procedure is scheduled on  03/02/2024.   Report to [facility] at  0700  A.M.   Call this number if you have problems the morning of surgery:  [phone]  If you experience any cold or flu symptoms such as cough, fever, chills, shortness of breath, etc. between now and your scheduled surgery, please notify us at the above number.   Remember:         DO NOT miss any doses of your eliquis before your procedure.   Do not eat after midnight.   You may drink clear liquids until  0500 am on 03/02/2024.    Clear liquids allowed are:                    Water, Juice (No red color; non-citric and without pulp; diabetics please choose diet or no sugar options), Carbonated beverages (diabetics please choose diet or no sugar options), Clear Tea (No creamer, milk, or cream, including half & half and powdered creamer), Black Coffee Only (No creamer, milk or cream, including half & half and powdered creamer), and Clear Sports drink (No red color; diabetics please choose diet or no sugar options)    Take these medicines the morning of surgery with A SIP OF WATER     amlodipine, eliquis, hydrocodone(if needed), omeprazole, tamsulosin.     Do not wear jewelry, make-up or nail polish, including gel polish,  artificial nails, or any other type of covering on natural nails (fingers and  toes).  Do not wear lotions, powders, or perfumes, or deodorant.  Do not shave 48 hours prior to surgery.  Men may shave face and neck.  Do not bring valuables to the hospital.  [facility] is not responsible for any belongings or valuables.  Contacts, dentures or bridgework may not be worn into surgery.  Leave your suitcase in the car.  After surgery it may be brought to your room.  For patients admitted to the hospital, discharge time will be determined by your treatment team.  Patients discharged the day of surgery will not be allowed to drive home  and must have someone with them for 24 hours.    Special instructions:   DO NOT smoke tobacco or vape for 24 hours before your procedure.  Please read over the following fact sheets that you were given. Anesthesia Post-op Instructions and Care and Recovery After Surgery      Electrical Cardioversion Electrical cardioversion is the delivery of a jolt of electricity to restore a normal rhythm to the heart. A rhythm that is too fast or is not regular (arrhythmia) keeps the heart from pumping blood well. There is also another type of cardioversion called a chemical (pharmacologic) cardioversion. This is when your health care provider gives you one or more medicines to bring back your regular heart rhythm. Electrical cardioversion is done as a scheduled procedure for arrhythmiasthat are not life-threatening. Electrical cardioversion may also be done in an emergency for sudden life-threatening arrhythmias. Tell a health care provider about: Any allergies you have. All medicines you are taking, including vitamins, herbs, eye drops, creams, and over-the-counter medicines. Any problems you or family members have had with sedatives or anesthesia. Any bleeding problems you have. Any surgeries you have had, including a pacemaker, defibrillator, or other implanted device. Any medical conditions you have. Whether you are pregnant or may be pregnant. What are the  risks? Your provider will talk with you about risks. These include: Allergic reactions to medicines. Irritation to the skin on your chest or back where the sticky pads (electrodes) or paddles were put during electrical cardioversion. A blood clot that breaks free and travels to other parts of your body, such as your brain. Return of a worse abnormal heart rhythm that will need to be treated with medicines, a pacemaker, or an implantable cardioverter defibrillator (ICD). What happens before the procedure? Medicines Your provider may give  you: Blood-thinning medicines (anticoagulants) so your blood does not clot as easily. If your provider gives you this medicine, you may need to take it for 4 weeks before the procedure. Medicines to help stabilize your heart rate and rhythm. Ask your provider about: Changing or stopping your regular medicines. These include any diabetes medicines or blood thinners you take. Taking medicines such as aspirin and ibuprofen. These medicines can thin your blood. Do not take them unless your provider tells you to. Taking over-the-counter medicines, vitamins, herbs, and supplements. General instructions Follow instructions from your provider about what you may eat and drink. Do not put any lotions, powders, or ointments on your chest and back for 24 hours before the procedure. They can cause problems with the electrodes or paddles used to deliver electricity to your heart. Do not wear jewelry as this can interfere with delivering electricity to your heart. If you will be going home right after the procedure, plan to have a responsible adult: Take you home from the hospital or clinic. You will not be allowed to drive. Care for you for the time you are told. Tests You may have an exam or testing. This may include: Blood labs. A transesophageal echocardiogram (TEE). What happens during the procedure?     An IV will be inserted into one of your veins. You will be given a sedative. This helps you relax. Electrodes or metal paddles will be placed on your chest. They may be placed in one of these ways: One placed on your right chest, the other on the left ribs. One placed on your chest and the other on your back. An electrical shock will be delivered. The shock briefly stops (resets) your heart rhythm. Your provider will check to see if your heart rhythm is now normal. Some people need only one shock. Some need more to restore a normal heart rhythm. The procedure may vary among providers and  hospitals. What happens after the procedure? Your blood pressure, heart rate, breathing rate, and blood oxygen level will be monitored until you leave the hospital or clinic. Your heart rhythm will be watched to make sure it does not change. This information is not intended to replace advice given to you by your health care provider. Make sure you discuss any questions you have with your health care provider. Document Revised: 06/27/2022 Document Reviewed: 06/27/2022 Elsevier Patient Education  2024 [employer]General Anesthesia, Adult, Care After The following information offers guidance on how to care for yourself after your procedure. Your health care provider may also give you more specific instructions. If you have problems or questions, contact your health care provider. What can I expect after the procedure? After the procedure, it is common for people to: Have pain or discomfort at the IV site. Have nausea or vomiting. Have a sore throat or hoarseness. Have trouble concentrating. Feel cold or chills. Feel weak, sleepy, or tired (fatigue). Have soreness and body aches. These can affect parts of the body  that were not involved in surgery. Follow these instructions at home: For the time period you were told by your health care provider:  Rest. Do not participate in activities where you could fall or become injured. Do not drive or use machinery. Do not drink alcohol. Do not take sleeping pills or medicines that cause drowsiness. Do not make important decisions or sign legal documents. Do not take care of children on your own. General instructions Drink enough fluid to keep your urine pale yellow. If you have sleep apnea, surgery and certain medicines can increase your risk for breathing problems. Follow instructions from your health care provider about wearing your sleep device: Anytime you are sleeping, including during daytime naps. While taking prescription pain medicines,  sleeping medicines, or medicines that make you drowsy. Return to your normal activities as told by your health care provider. Ask your health care provider what activities are safe for you. Take over-the-counter and prescription medicines only as told by your health care provider. Do not use any products that contain nicotine or tobacco. These products include cigarettes, chewing tobacco, and vaping devices, such as e-cigarettes. These can delay incision healing after surgery. If you need help quitting, ask your health care provider. Contact a health care provider if: You have nausea or vomiting that does not get better with medicine. You vomit every time you eat or drink. You have pain that does not get better with medicine. You cannot urinate or have bloody urine. You develop a skin rash. You have a fever. Get help right away if: You have trouble breathing. You have chest pain. You vomit blood. These symptoms may be an emergency. Get help right away. Call 911. Do not wait to see if the symptoms will go away. Do not drive yourself to the hospital. Summary After the procedure, it is common to have a sore throat, hoarseness, nausea, vomiting, or to feel weak, sleepy, or fatigue. For the time period you were told by your health care provider, do not drive or use machinery. Get help right away if you have difficulty breathing, have chest pain, or vomit blood. These symptoms may be an emergency. This information is not intended to replace advice given to you by your health care provider. Make sure you discuss any questions you have with your health care provider. Document Revised: 02/01/2022 Document Reviewed: 02/01/2022 Elsevier Patient Education  2024 [employer].

## 2024-02-27 ENCOUNTER — Encounter (HOSPITAL_COMMUNITY): Payer: Self-pay

## 2024-02-27 ENCOUNTER — Encounter (HOSPITAL_COMMUNITY)
Admission: RE | Admit: 2024-02-27 | Discharge: 2024-02-27 | Disposition: A | Discharge status: Home / Self Care | Source: Ambulatory Visit | Attending: Internal Medicine | Admitting: Internal Medicine

## 2024-02-27 VITALS — BP 117/77 | HR 93 | Temp 98.0°F | Resp 18 | Ht 70.0 in | Wt 243.2 lb

## 2024-02-27 DIAGNOSIS — Z01818 Encounter for other preprocedural examination: Secondary | ICD-10-CM | POA: Diagnosis not present

## 2024-02-27 DIAGNOSIS — Z01812 Encounter for preprocedural laboratory examination: Secondary | ICD-10-CM | POA: Insufficient documentation

## 2024-02-27 HISTORY — DX: Cardiac arrhythmia, unspecified: I49.9

## 2024-02-27 HISTORY — DX: Unspecified atrial fibrillation: I48.91

## 2024-02-27 LAB — CBC WITH DIFFERENTIAL/PLATELET
Abs Immature Granulocytes: 0.03 10*3/uL (ref 0.00–0.07)
Basophils Absolute: 0.1 10*3/uL (ref 0.0–0.1)
Basophils Relative: 1 %
Eosinophils Absolute: 0.3 10*3/uL (ref 0.0–0.5)
Eosinophils Relative: 3 %
HCT: 42.9 % (ref 39.0–52.0)
Hemoglobin: 13.9 g/dL (ref 13.0–17.0)
Immature Granulocytes: 0 %
Lymphocytes Relative: 45 %
Lymphs Abs: 4.1 10*3/uL — ABNORMAL HIGH (ref 0.7–4.0)
MCH: 30.8 pg (ref 26.0–34.0)
MCHC: 32.4 g/dL (ref 30.0–36.0)
MCV: 94.9 fL (ref 80.0–100.0)
Monocytes Absolute: 0.7 10*3/uL (ref 0.1–1.0)
Monocytes Relative: 8 %
Neutro Abs: 3.9 10*3/uL (ref 1.7–7.7)
Neutrophils Relative %: 43 %
Platelets: 255 10*3/uL (ref 150–400)
RBC: 4.52 MIL/uL (ref 4.22–5.81)
RDW: 13.4 % (ref 11.5–15.5)
WBC: 9 10*3/uL (ref 4.0–10.5)
nRBC: 0 % (ref 0.0–0.2)

## 2024-02-27 LAB — BASIC METABOLIC PANEL WITH GFR
Anion gap: 11 (ref 5–15)
BUN: 14 mg/dL (ref 8–23)
CO2: 22 mmol/L (ref 22–32)
Calcium: 9 mg/dL (ref 8.9–10.3)
Chloride: 102 mmol/L (ref 98–111)
Creatinine, Ser: 0.81 mg/dL (ref 0.61–1.24)
GFR, Estimated: >60 mL/min (ref >60)
Glucose, Bld: 143 mg/dL — ABNORMAL HIGH (ref 70–99)
Potassium: 4 mmol/L (ref 3.5–5.1)
Sodium: 135 mmol/L (ref 135–145)

## 2024-03-01 DIAGNOSIS — G894 Chronic pain syndrome: Secondary | ICD-10-CM | POA: Diagnosis not present

## 2024-03-01 DIAGNOSIS — Z79891 Long term (current) use of opiate analgesic: Secondary | ICD-10-CM | POA: Diagnosis not present

## 2024-03-02 ENCOUNTER — Encounter (HOSPITAL_COMMUNITY)
Admission: RE | Disposition: A | Discharge status: Home / Self Care | Payer: Self-pay | Source: Home / Self Care | Attending: Internal Medicine

## 2024-03-02 ENCOUNTER — Other Ambulatory Visit: Payer: Self-pay

## 2024-03-02 ENCOUNTER — Ambulatory Visit (HOSPITAL_COMMUNITY): Admitting: Anesthesiology

## 2024-03-02 ENCOUNTER — Ambulatory Visit (HOSPITAL_COMMUNITY)
Admission: RE | Admit: 2024-03-02 | Discharge: 2024-03-02 | Disposition: A | Discharge status: Home / Self Care | Attending: Internal Medicine | Admitting: Internal Medicine

## 2024-03-02 ENCOUNTER — Encounter (HOSPITAL_COMMUNITY): Payer: Self-pay | Admitting: Internal Medicine

## 2024-03-02 ENCOUNTER — Ambulatory Visit (HOSPITAL_BASED_OUTPATIENT_CLINIC_OR_DEPARTMENT_OTHER): Admitting: Anesthesiology

## 2024-03-02 DIAGNOSIS — I10 Essential (primary) hypertension: Secondary | ICD-10-CM

## 2024-03-02 DIAGNOSIS — Z79899 Other long term (current) drug therapy: Secondary | ICD-10-CM | POA: Insufficient documentation

## 2024-03-02 DIAGNOSIS — Z7901 Long term (current) use of anticoagulants: Secondary | ICD-10-CM | POA: Diagnosis not present

## 2024-03-02 DIAGNOSIS — Z87891 Personal history of nicotine dependence: Secondary | ICD-10-CM

## 2024-03-02 DIAGNOSIS — M1990 Unspecified osteoarthritis, unspecified site: Secondary | ICD-10-CM | POA: Insufficient documentation

## 2024-03-02 DIAGNOSIS — I4891 Unspecified atrial fibrillation: Secondary | ICD-10-CM | POA: Diagnosis not present

## 2024-03-02 DIAGNOSIS — K219 Gastro-esophageal reflux disease without esophagitis: Secondary | ICD-10-CM | POA: Diagnosis not present

## 2024-03-02 DIAGNOSIS — I4819 Other persistent atrial fibrillation: Secondary | ICD-10-CM

## 2024-03-02 HISTORY — PX: CARDIOVERSION: SHX1299

## 2024-03-02 SURGERY — CARDIOVERSION
Anesthesia: General

## 2024-03-02 MED ORDER — AMIODARONE HCL 200 MG PO TABS: 200.0000 mg | ORAL_TABLET | Freq: Every day | ORAL | 0 refills | Status: DC

## 2024-03-02 MED ORDER — PROPOFOL 10 MG/ML IV BOLUS
INTRAVENOUS | Status: DC | PRN
Start: 2024-03-02 — End: 2024-03-02
  Administered 2024-03-02: 130 mg via INTRAVENOUS

## 2024-03-02 MED ORDER — SODIUM CHLORIDE 0.9% FLUSH: 3.0000 mL | INTRAVENOUS | Status: DC | PRN

## 2024-03-02 MED ORDER — METOPROLOL TARTRATE 25 MG PO TABS: 12.5000 mg | ORAL_TABLET | Freq: Two times a day (BID) | ORAL | 3 refills | Status: AC

## 2024-03-02 MED ORDER — SODIUM CHLORIDE 0.9% FLUSH: 3.0000 mL | Freq: Two times a day (BID) | INTRAVENOUS | Status: DC

## 2024-03-02 MED ORDER — LACTATED RINGERS IV SOLN: INTRAVENOUS | Status: DC | PRN

## 2024-03-02 MED ORDER — AMIODARONE HCL 200 MG PO TABS: 200.0000 mg | ORAL_TABLET | Freq: Two times a day (BID) | ORAL | 0 refills | Status: DC

## 2024-03-02 NOTE — Progress Notes (Signed)
 Electrical Cardioversion Procedure Note Melvin Turner [id number] 06/19/1956  Procedure: Electrical Cardioversion Indications:  Atrial Fibrillation  Procedure Details Consent: Risks of procedure as well as the alternatives and risks of each were explained to the (patient/caregiver).  Consent for procedure obtained. Time Out: Verified patient identification, verified procedure, site/side was marked, verified correct patient position, special equipment/implants available, medications/allergies/relevent history reviewed, required imaging and test results available.  Performed  Patient placed on cardiac monitor, pulse oximetry, supplemental oxygen as necessary.  Sedation given: Benzodiazepines Pacer pads placed anterior and posterior chest.  Cardioverted 1 time(s).  Cardioverted at 150J.  Evaluation Findings: Post procedure EKG shows: NSR Complications: None Patient did tolerate procedure well.   Anson Basta 03/02/2024, 9:52 AM

## 2024-03-02 NOTE — Anesthesia Preprocedure Evaluation (Addendum)
 Anesthesia Evaluation  Patient identified by MRN, date of birth, ID band Patient awake    Airway Mallampati: II       Dental no notable dental hx.    Pulmonary former smoker   Pulmonary exam normal breath sounds clear to auscultation       Cardiovascular hypertension, + dysrhythmias Atrial Fibrillation  Rhythm:Irregular Rate:Abnormal     Neuro/Psych negative neurological ROS  negative psych ROS   GI/Hepatic ,GERD  ,,  Endo/Other  negative endocrine ROS    Renal/GU negative Renal ROS  negative genitourinary   Musculoskeletal  (+) Arthritis ,    Abdominal  (+) + obese  Peds negative pediatric ROS (+)  Hematology negative hematology ROS (+)   Anesthesia Other Findings   Reproductive/Obstetrics                             Anesthesia Physical Anesthesia Plan  ASA: 3  Anesthesia Plan: General   Post-op Pain Management:    Induction:   PONV Risk Score and Plan: 0  Airway Management Planned: Nasal Cannula  Additional Equipment:   Intra-op Plan:   Post-operative Plan:   Informed Consent: I have reviewed the patients History and Physical, chart, labs and discussed the procedure including the risks, benefits and alternatives for the proposed anesthesia with the patient or authorized representative who has indicated his/her understanding and acceptance.       Plan Discussed with: CRNA and Surgeon  Anesthesia Plan Comments:        Anesthesia Quick Evaluation

## 2024-03-02 NOTE — Transfer of Care (Signed)
 Immediate Anesthesia Transfer of Care Note  Patient: Melvin Turner  Procedure(s) Performed: CARDIOVERSION  Patient Location: PACU  Anesthesia Type:General  Level of Consciousness: awake and patient cooperative  Airway & Oxygen Therapy: Patient Spontanous Breathing and Patient connected to nasal cannula oxygen  Post-op Assessment: Report given to RN and Post -op Vital signs reviewed and stable  Post vital signs: Reviewed and stable  Last Vitals:  Vitals Value Taken Time  BP 101/81 03/02/24 0922  Temp 97.5 03/02/24  0922  Pulse 63 03/02/24 0922  Resp 14 03/02/24  0921  SpO2 100 % 03/02/24 0921  Vitals shown include unfiled device data.  Last Pain:  Vitals:   03/02/24 0751  TempSrc: Oral  PainSc: 0-No pain         Complications: No notable events documented.

## 2024-03-02 NOTE — Anesthesia Procedure Notes (Signed)
 Date/Time: 03/02/2024 9:06 AM  Performed by: Verline Glow, CRNAPre-anesthesia Checklist: Patient identified, Emergency Drugs available, Suction available, Timeout performed and Patient being monitored Patient Re-evaluated:Patient Re-evaluated prior to induction Oxygen Delivery Method: Nasal Cannula

## 2024-03-02 NOTE — Interval H&P Note (Signed)
 History and Physical Interval Note:  03/02/2024 9:13 AM  Melvin Turner  has presented today for surgery, with the diagnosis of a-fib.  The various methods of treatment have been discussed with the patient and family. After consideration of risks, benefits and other options for treatment, the patient has consented to  Procedure(s): CARDIOVERSION (N/A) as a surgical intervention.  The patient's history has been reviewed, patient examined, no change in status, stable for surgery.  I have reviewed the patient's chart and labs.  Questions were answered to the patient's satisfaction.    Compliant with AC. Last dose this AM. In Afib today.   Bruna Dills P Raylan Troiani, MD [facility]

## 2024-03-02 NOTE — CV Procedure (Signed)
   DIRECT CURRENT CARDIOVERSION  NAME:  KHAI ARRONA    MRN: [id number] DOB:  [birth year]    ADMIT DATE: 03/02/2024  INDICATIONS: Symptomatic atrial fibrillation  PROCEDURE:  Informed consent was obtained prior to the procedure. Anesthesia was monitored by Dr. Porter Britain. The patient had the defibrillator pads placed in the anterior and posterior position. Once an appropriate level of sedation was confirmed, the patient was cardioverted x 1 with 150J of biphasic synchronized energy.  The patient converted to NSR.  There were no apparent complications.  The patient had normal neuro status and respiratory status post procedure with vitals stable as recorded elsewhere.  Adequate airway was maintained throughout and vital signs monitored per protocol.  RECOMMENDATIONS: Post DCCV EKG showed NSR. HR 60s. Decrease the dose of metoprolol tartarate from 25 mg to 12.5 mg BID. Start Amiodarone 200 mg BID x 3 weeks followed by 200 mg once daily. Continue Eliquis 5 mg BID. If BP<100 mm Hg, will need to hold Amlodipine and Olmesartan.  Chevie Birkhead Priya Kyanne Rials, MD [place]  [facility]  9:14 AM

## 2024-03-02 NOTE — Anesthesia Postprocedure Evaluation (Signed)
 Anesthesia Post Note  Patient: Melvin Turner  Procedure(s) Performed: CARDIOVERSION  Patient location during evaluation: PACU Anesthesia Type: General Level of consciousness: awake and alert Pain management: pain level controlled Vital Signs Assessment: post-procedure vital signs reviewed and stable Respiratory status: spontaneous breathing, nonlabored ventilation, respiratory function stable and patient connected to nasal cannula oxygen Cardiovascular status: stable and blood pressure returned to baseline Postop Assessment: no apparent nausea or vomiting Anesthetic complications: no   No notable events documented.   Last Vitals:  Vitals:   03/02/24 0924 03/02/24 0930  BP: 104/72 116/73  Pulse: 64 62  Resp: 16 16  Temp: (!) 36.4 C   SpO2: 97% 96%    Last Pain:  Vitals:   03/02/24 0924  TempSrc:   PainSc: 0-No pain                 Beacher Limerick

## 2024-03-03 ENCOUNTER — Encounter (HOSPITAL_COMMUNITY): Payer: Self-pay | Admitting: Internal Medicine

## 2024-03-05 ENCOUNTER — Telehealth: Payer: Self-pay | Admitting: Internal Medicine

## 2024-03-05 NOTE — Telephone Encounter (Signed)
  Pt is returning call to get echo result. He said he will be busy today and to call him on Monday after 9 am

## 2024-03-08 ENCOUNTER — Telehealth: Payer: Self-pay

## 2024-03-08 DIAGNOSIS — I77819 Aortic ectasia, unspecified site: Secondary | ICD-10-CM

## 2024-03-08 NOTE — Telephone Encounter (Signed)
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  Will need to monitor aorta dilatation on an annual basis with CT chest/aorta.  Obtain CTA chest/aorta in 1 year

## 2024-03-08 NOTE — Telephone Encounter (Signed)
 Patient notified of result.  Please refer to phone note from today for complete details.   Camilo Cella, [place] 03/08/2024 10:11 AM

## 2024-03-08 NOTE — Telephone Encounter (Signed)
 The patient has been notified of the result and verbalized understanding.  All questions (if any) were answered. Placed order for CTA chest aorta in a year. Camilo Cella, [place] 03/08/2024 9:13 AM

## 2024-03-23 DIAGNOSIS — K080 Exfoliation of teeth due to systemic causes: Secondary | ICD-10-CM | POA: Diagnosis not present

## 2024-04-01 ENCOUNTER — Encounter: Payer: Self-pay | Admitting: Nurse Practitioner

## 2024-04-01 ENCOUNTER — Ambulatory Visit: Admitting: Nurse Practitioner

## 2024-04-01 VITALS — BP 111/71 | HR 77 | Ht 70.0 in | Wt 240.0 lb

## 2024-04-01 DIAGNOSIS — G8929 Other chronic pain: Secondary | ICD-10-CM | POA: Insufficient documentation

## 2024-04-01 DIAGNOSIS — R0683 Snoring: Secondary | ICD-10-CM | POA: Insufficient documentation

## 2024-04-01 DIAGNOSIS — Z87891 Personal history of nicotine dependence: Secondary | ICD-10-CM

## 2024-04-01 DIAGNOSIS — G4719 Other hypersomnia: Secondary | ICD-10-CM

## 2024-04-01 DIAGNOSIS — E66811 Obesity, class 1: Secondary | ICD-10-CM | POA: Insufficient documentation

## 2024-04-01 DIAGNOSIS — R0600 Dyspnea, unspecified: Secondary | ICD-10-CM

## 2024-04-01 DIAGNOSIS — I4819 Other persistent atrial fibrillation: Secondary | ICD-10-CM

## 2024-04-01 NOTE — Assessment & Plan Note (Signed)
 Rate controlled. Follow up with cardiology as scheduled. Advised to discuss use of saw palmetto while on chronic AC given increased risk for bleeding. Pt verbalized understanding

## 2024-04-01 NOTE — Progress Notes (Signed)
 @Patient  ID: Melvin Turner, male    DOB: 04-30-1956, 68 y.o.   MRN: [id number]  Chief Complaint  Patient presents with   Establish Care   Sleep Apnea    Referring provider: Mallipeddi, Vishnu P, MD  HPI: 68 year old male, former smoker referred for sleep consult. Past medical history significant for HTN, PAF s/p cardioversion and on chronic AC, BPH, GERD.  TEST/EVENTS:   04/01/2024: Today - sleep consult Discussed the use of AI scribe software for clinical note transcription with the patient, who gave verbal consent to proceed.  History of Present Illness   Melvin KOLODZIEJ is a 68 year old male who presents for sleep consult.  He experiences sleep disturbances characterized by frequent awakenings throughout the night. He often feels like he cannot breathe when lying on his left side, prompting him to sit up for about an hour before he can relax and fall asleep again. He has been told that he snores and occasionally wakes up feeling like he is not breathing. These symptoms have been ongoing for a long time. No episodes of drowsy driving or falling asleep while driving. He feels tired 'kind of all the time' during the day. No sleep parasomnias or sleep paralysis. He reports frequent nighttime urination and notes that much of his getting up at night is to go to the bathroom. He is on flomax , which helps some.   He has a history of atrial fibrillation, for which he underwent cardioversion earlier this month and started amiodarone . Still has occasional sensations of his heart 'trying to jump out of his chest' since the procedure. No CP, lightheadedness/dizziness, syncope  He goes to bed between 8pm and 3 am. Gets up around 4-9 am. Falls asleep easily for the most part. Wakes numerous times a night. No sleep aids. Never had a prior sleep study. No significant weight change. No supplemental oxygen use.   His social history includes daily alcohol consumption, specifically about six beers per day,  with a cutoff at 6 PM. He also consumes about three cups of coffee in the morning. He takes pain medication three times a day, but sometimes skips doses if he is not active. He is retired but engages in activities like mowing. No issues with drowsiness while doing so. He lives with his wife.   Epworth 8      Allergies  Allergen Reactions   Alpha-Gal    Poison Ivy Extract [Poison Ivy Extract] Hives and Rash     There is no immunization history on file for this patient.  Past Medical History:  Diagnosis Date   Arthritis    Atrial fibrillation (HCC)    BPH (benign prostatic hyperplasia)    Dysrhythmia    GERD (gastroesophageal reflux disease)    Hypertension     Tobacco History: Social History   Tobacco Use  Smoking Status Former   Current packs/day: 0.00   Average packs/day: 1 pack/day for 30.0 years (30.0 ttl pk-yrs)   Types: Cigarettes   Start date: 01/30/1976   Quit date: 01/29/2006   Years since quitting: 18.1  Smokeless Tobacco Never   Counseling given: Not Answered   Outpatient Medications Prior to Visit  Medication Sig Dispense Refill   amiodarone  (PACERONE ) 200 MG tablet Take 1 tablet (200 mg total) by mouth daily. 90 tablet 0   amLODipine  (NORVASC ) 2.5 MG tablet Take 1 tablet (2.5 mg total) by mouth daily. 30 tablet 5   Ascorbic Acid  (VITAMIN C  CR) 1000 MG  TBCR Take 1,000 mg by mouth daily.     ELIQUIS 5 MG TABS tablet Take 5 mg by mouth 2 (two) times daily.     fluticasone (FLONASE) 50 MCG/ACT nasal spray Place 1 spray into both nostrils daily as needed for allergies or rhinitis.     HYDROcodone -acetaminophen  (NORCO) 10-325 MG tablet Take 1 tablet by mouth in the morning, at noon, and at bedtime.     metoprolol  tartrate (LOPRESSOR ) 25 MG tablet Take 0.5 tablets (12.5 mg total) by mouth 2 (two) times daily. 90 tablet 3   Multiple Vitamin (MULTIVITAMIN WITH MINERALS) TABS tablet Take 1 tablet by mouth daily.     olmesartan (BENICAR) 40 MG tablet Take 40 mg by  mouth daily.     Omega-3 Fatty Acids (FISH OIL OMEGA-3) 1000 MG CAPS Take 1,000 mg by mouth daily.     omeprazole (PRILOSEC) 20 MG capsule Take 20 mg by mouth daily.     Saw Palmetto 450 MG CAPS Take 450 mg by mouth 2 (two) times daily.     tamsulosin  (FLOMAX ) 0.4 MG CAPS capsule Take 0.4 mg by mouth daily.     amiodarone  (PACERONE ) 200 MG tablet Take 1 tablet (200 mg total) by mouth 2 (two) times daily for 21 days. 42 tablet 0   No facility-administered medications prior to visit.     Review of Systems:   Constitutional: No weight loss or gain, night sweats, fevers, chills, or lassitude. +fatigue  HEENT: No headaches, difficulty swallowing, tooth/dental problems, or sore throat. No sneezing, itching, ear ache, nasal congestion, or post nasal drip CV:  +palpitations, PND. No chest pain, orthopnea, swelling in lower extremities, anasarca, dizziness, syncope Resp: +snoring; shortness of breath with exertion (baseline). No excess mucus or change in color of mucus. No productive or non-productive. No hemoptysis. No wheezing.  No chest wall deformity GI:  No heartburn, indigestion, abdominal pain, nausea, vomiting, diarrhea, change in bowel habits, loss of appetite, bloody stools.  GU: No dysuria, change in color of urine, urgency or frequency during the day. +nocturia  Skin: No rash, lesions, ulcerations MSK:  +chronic joint pain  Neuro: No dizziness or lightheadedness.  Psych: No depression or anxiety. Mood stable. +sleep disturbance     Physical Exam:  BP 111/71   Pulse 77   Ht 5\' 10"  (1.778 m)   Wt 240 lb (108.9 kg)   SpO2 95%   BMI 34.44 kg/m   GEN: Pleasant, interactive, well-kempt; obese; in no acute distress HEENT:  Normocephalic and atraumatic. PERRLA. Sclera white. Nasal turbinates pink, moist and patent bilaterally. No rhinorrhea present. Oropharynx pink and moist, without exudate or edema. No lesions, ulcerations, or postnasal drip. Mallampati II/III NECK:  Supple w/ fair  ROM. Thyroid symmetrical with no goiter or nodules palpated. No lymphadenopathy.   CV: Irregular rhythm, rate controlled, no m/r/g, no peripheral edema. Pulses intact, +2 bilaterally. No cyanosis, pallor or clubbing. PULMONARY:  Unlabored, regular breathing. Clear bilaterally A&P w/o wheezes/rales/rhonchi. No accessory muscle use.  GI: BS present and normoactive. Soft, non-tender to palpation.  MSK: No erythema, warmth or tenderness. Cap refil <2 sec all extrem.  Neuro: A/Ox3. No focal deficits noted.   Skin: Warm, no lesions or rashe Psych: Normal affect and behavior. Judgement and thought content appropriate.     Lab Results:  CBC    Component Value Date/Time   WBC 9.0 02/27/2024 0939   RBC 4.52 02/27/2024 0939   HGB 13.9 02/27/2024 0939   HCT 42.9 02/27/2024 0939  PLT 255 02/27/2024 0939   MCV 94.9 02/27/2024 0939   MCH 30.8 02/27/2024 0939   MCHC 32.4 02/27/2024 0939   RDW 13.4 02/27/2024 0939   LYMPHSABS 4.1 (H) 02/27/2024 0939   MONOABS 0.7 02/27/2024 0939   EOSABS 0.3 02/27/2024 0939   BASOSABS 0.1 02/27/2024 0939    BMET    Component Value Date/Time   NA 135 02/27/2024 0939   K 4.0 02/27/2024 0939   CL 102 02/27/2024 0939   CO2 22 02/27/2024 0939   GLUCOSE 143 (H) 02/27/2024 0939   BUN 14 02/27/2024 0939   CREATININE 0.81 02/27/2024 0939   CALCIUM 9.0 02/27/2024 0939   GFRNONAA >60 02/27/2024 0939   GFRAA >60 10/11/2019 0629    BNP No results found for: "BNP"   Imaging:  No results found.  Administration History     None           No data to display          No results found for: "NITRICOXIDE"      Assessment & Plan:   Excessive daytime sleepiness He has snoring, excessive daytime sleepiness, nocturnal apneic events, restless sleep. BMI 34. History of a fib. Epworth 8. Given this,  I am concerned he could have sleep disordered breathing with obstructive sleep apnea. He will need sleep study for further evaluation.    - discussed  how weight can impact sleep and risk for sleep disordered breathing - discussed options to assist with weight loss: combination of diet modification, cardiovascular and strength training exercises   - had an extensive discussion regarding the adverse health consequences related to untreated sleep disordered breathing - specifically discussed the risks for hypertension, coronary artery disease, cardiac dysrhythmias, cerebrovascular disease, and diabetes - lifestyle modification discussed   - discussed how sleep disruption can increase risk of accidents, particularly when driving - safe driving practices were discussed  Patient Instructions  Given your symptoms, I am concerned that you may have sleep disordered breathing with sleep apnea. You will need a sleep study for further evaluation. Someone will contact you to schedule this.   We discussed how untreated sleep apnea puts an individual at risk for cardiac arrhthymias, pulm HTN, DM, stroke and increases their risk for daytime accidents. We also briefly reviewed treatment options including weight loss, side sleeping position, oral appliance, CPAP therapy or referral to ENT for possible surgical options  Use caution when driving and pull over if you become sleepy.  Follow up in 6-8 weeks with Katie Hezzie Karim,NP to go over sleep study results, or sooner, if needed. Friday PM virtual clinic preferred       Loud snoring See above  Persistent atrial fibrillation (HCC) Rate controlled. Follow up with cardiology as scheduled. Advised to discuss use of saw palmetto while on chronic AC given increased risk for bleeding. Pt verbalized understanding   Obesity (BMI 30.0-34.9) BMI 34. Healthy weight loss encouraged  Chronic pain Utilizing narcotics approx 3 times a day with hydrocodone /acetaminophen . Sedating medications can increase risk for central sleep apnea and worsen obstruction. Will evaluate on sleep study   Advised if symptoms do not  improve or worsen, to please contact office for sooner follow up or seek emergency care.   I spent 45 minutes of dedicated to the care of this patient on the date of this encounter to include pre-visit review of records, face-to-face time with the patient discussing conditions above, post visit ordering of testing, clinical documentation with the electronic health record, making  appropriate referrals as documented, and communicating necessary findings to members of the patients care team.  Roetta Clarke, NP 04/01/2024  Pt aware and understands NP's role.

## 2024-04-01 NOTE — Assessment & Plan Note (Signed)
 See above

## 2024-04-01 NOTE — Assessment & Plan Note (Signed)
 Utilizing narcotics approx 3 times a day with hydrocodone /acetaminophen . Sedating medications can increase risk for central sleep apnea and worsen obstruction. Will evaluate on sleep study

## 2024-04-01 NOTE — Patient Instructions (Addendum)
 Given your symptoms, I am concerned that you may have sleep disordered breathing with sleep apnea. You will need a sleep study for further evaluation. Someone will contact you to schedule this.   We discussed how untreated sleep apnea puts an individual at risk for cardiac arrhthymias, pulm HTN, DM, stroke and increases their risk for daytime accidents. We also briefly reviewed treatment options including weight loss, side sleeping position, oral appliance, CPAP therapy or referral to ENT for possible surgical options  Use caution when driving and pull over if you become sleepy.  Follow up in 6-8 weeks with Katie Caterra Ostroff,NP to go over sleep study results, or sooner, if needed. Friday PM virtual clinic preferred

## 2024-04-01 NOTE — Assessment & Plan Note (Signed)
 BMI 34. Healthy weight loss encouraged.

## 2024-04-01 NOTE — Assessment & Plan Note (Signed)
 He has snoring, excessive daytime sleepiness, nocturnal apneic events, restless sleep. BMI 34. History of a fib. Epworth 8. Given this,  I am concerned he could have sleep disordered breathing with obstructive sleep apnea. He will need sleep study for further evaluation.    - discussed how weight can impact sleep and risk for sleep disordered breathing - discussed options to assist with weight loss: combination of diet modification, cardiovascular and strength training exercises   - had an extensive discussion regarding the adverse health consequences related to untreated sleep disordered breathing - specifically discussed the risks for hypertension, coronary artery disease, cardiac dysrhythmias, cerebrovascular disease, and diabetes - lifestyle modification discussed   - discussed how sleep disruption can increase risk of accidents, particularly when driving - safe driving practices were discussed  Patient Instructions  Given your symptoms, I am concerned that you may have sleep disordered breathing with sleep apnea. You will need a sleep study for further evaluation. Someone will contact you to schedule this.   We discussed how untreated sleep apnea puts an individual at risk for cardiac arrhthymias, pulm HTN, DM, stroke and increases their risk for daytime accidents. We also briefly reviewed treatment options including weight loss, side sleeping position, oral appliance, CPAP therapy or referral to ENT for possible surgical options  Use caution when driving and pull over if you become sleepy.  Follow up in 6-8 weeks with Katie Daimen Shovlin,NP to go over sleep study results, or sooner, if needed. Friday PM virtual clinic preferred

## 2024-04-08 ENCOUNTER — Telehealth: Payer: Self-pay | Admitting: Nurse Practitioner

## 2024-04-08 ENCOUNTER — Other Ambulatory Visit: Payer: Self-pay | Admitting: Nurse Practitioner

## 2024-04-08 ENCOUNTER — Encounter: Payer: Self-pay | Admitting: Nurse Practitioner

## 2024-04-08 ENCOUNTER — Ambulatory Visit: Attending: Nurse Practitioner | Admitting: Nurse Practitioner

## 2024-04-08 ENCOUNTER — Ambulatory Visit: Attending: Nurse Practitioner

## 2024-04-08 VITALS — BP 118/74 | HR 74 | Ht 71.0 in | Wt 243.8 lb

## 2024-04-08 DIAGNOSIS — R5382 Chronic fatigue, unspecified: Secondary | ICD-10-CM

## 2024-04-08 DIAGNOSIS — I10 Essential (primary) hypertension: Secondary | ICD-10-CM | POA: Diagnosis not present

## 2024-04-08 DIAGNOSIS — I4891 Unspecified atrial fibrillation: Secondary | ICD-10-CM | POA: Diagnosis not present

## 2024-04-08 DIAGNOSIS — R0609 Other forms of dyspnea: Secondary | ICD-10-CM

## 2024-04-08 DIAGNOSIS — I77819 Aortic ectasia, unspecified site: Secondary | ICD-10-CM

## 2024-04-08 NOTE — Progress Notes (Unsigned)
 Cardiology Office Note:  .   Date:  04/08/2024 ID:  Melvin Turner, DOB July 04, 1956, MRN [id number] PCP: Veda Gerald, MD   [facility] Providers Cardiologist:  Jann Melody, MD    History of Present Illness: Melvin Turner   Melvin Turner is a 68 y.o. male with a PMH of persistent A-fib, hypertension, SVT, frequent PVCs, shortness of breath, GERD, BPH, presents today for scheduled follow-up.  Last seen by Dr. Mallipeddi on February 03, 2024.  Was found to be in persistent A-fib with frequent PVCs.  This was started on Lopressor  25 mg twice daily.  It was reported he does have occasional shortness of breath palpitations and chronic fatigue.  Was scheduled for DCCV.  Echocardiogram was arranged and revealed normal LVEF, indeterminate diastolic function, dilatation of aortic root at 42 mm and ascending aorta at 41 mm.  Recommended to obtain CTA of chest/aorta in 1 year.  Underwent cardioversion on March 02, 2024.  Today presents for follow-up.  He states he can tell he is back in A-fib. Admits to fatigue and occasional shortness of breath. Denies any chest pain, palpitations, syncope, presyncope, dizziness, orthopnea, PND, swelling or significant weight changes, acute bleeding, or claudication.   ROS: Negative.  See HPI.  Studies Reviewed: Melvin Turner    EKG: EKG Interpretation Date/Time:  Thursday Apr 08 2024 08:34:07 EDT Ventricular Rate:  79 PR Interval:    QRS Duration:  92 QT Interval:  400 QTC Calculation: 458 R Axis:   106  Text Interpretation: Atrial fibrillation Rightward axis Anteroseptal infarct (cited on or before 03-Feb-2024) When compared with ECG of 02-Mar-2024 09:19, Atrial fibrillation has replaced Sinus rhythm Questionable change in initial forces of Anterior leads Confirmed by Lasalle Pointer [phone]) on 04/08/2024 8:46:46 AM   Echo 02/2024:   1. Left ventricular ejection fraction, by estimation, is 55 to 60%. The  left ventricle has normal function. Left ventricular  endocardial border  not optimally defined to evaluate regional wall motion. There is mild  concentric left ventricular  hypertrophy. Left ventricular diastolic parameters are indeterminate.   2. Right ventricular systolic function is normal. The right ventricular  size is normal. Tricuspid regurgitation signal is inadequate for assessing  PA pressure.   3. Right atrial size was mildly dilated.   4. The mitral valve is grossly normal. Trivial mitral valve  regurgitation.   5. The aortic valve is tricuspid. There is mild calcification of the  aortic valve. Aortic valve regurgitation is not visualized. Aortic valve  sclerosis/calcification is present, without any evidence of aortic  stenosis. Aortic valve mean gradient  measures 6.0 mmHg.   6. Aortic dilatation noted. There is mild dilatation of the aortic root,  measuring 42 mm. There is mild dilatation of the ascending aorta,  measuring 41 mm.   7. Unable to estimate CVP.   Comparison(s): A prior study was performed on 03/01/2022. Prior images  reviewed side by side. LVEF normal range at 55-60%. Aortic valve sclerosis  without stenosis. Mildly dilated aortic root and ascending aorta.  Physical Exam:   VS:  BP 118/74   Pulse 74   Ht 5\' 11"  (1.803 m)   Wt 243 lb 12.8 oz (110.6 kg)   SpO2 98%   BMI 34.00 kg/m    Wt Readings from Last 3 Encounters:  04/08/24 243 lb 12.8 oz (110.6 kg)  04/01/24 240 lb (108.9 kg)  03/02/24 243 lb (110.2 kg)    GEN: Obese, 69 y.o. male in no acute distress NECK:  No JVD; No carotid bruits CARDIAC: S1/S2, irregularly irregular rhythm, no murmurs, rubs, gallops RESPIRATORY:  Clear to auscultation without rales, wheezing or rhonchi  ABDOMEN: Soft, non-tender, non-distended EXTREMITIES:  No edema; No deformity   ASSESSMENT AND PLAN: .    A-fib Patient can tell he is back in A-fib.  EKG confirms he is in rate controlled A-fib today.  Cardioversion was unsuccessful.  Will arrange 2-week ZIO XT monitor  to determine rhythm burden.  Will obtain CBC, CMET and TSH.  Continue Lopressor .  Continue Eliquis 5 mg twice daily for rate control.  Did discuss A-fib clinic and antiarrhythmic options with patient and stated he was interested.  Will run this past attending cardiologist.  Discussed avoiding triggers to A-fib.  Recommended Kardia mobile app.   HTN BP stable. Discussed to monitor BP at home at least 2 hours after medications and sitting for 5-10 minutes.  No medication changes at this time. Heart healthy diet and regular cardiovascular exercise encouraged.   Fatigue, DOE Etiology multifactorial.  Do believe this is partially related to his A-fib. Recent Echo overall unremarkable. Continue to f/u with PCP.  Aortic dilatation Most recent echo from April 2025 showed mild dilatation of aortic root measuring four 2 mm along with mild dilatation of ascending aorta, measuring 41 mm.  Plan to update echocardiogram in April 2026 for further evaluation. Care and ED precautions discussed.   I spent a total duration of 30 minutes reviewing prior notes, reviewing outside records including  labs, EKG today, face-to-face counseling of  medical condition, pathophysiology, evaluation, management, and documenting the findings in the note.    Dispo: Follow-up with me/APP in 3 to 4 months or sooner if any changes.  Signed, Lasalle Pointer, NP

## 2024-04-08 NOTE — Telephone Encounter (Signed)
 Checking percert on the following   2 week zio XT Dx: AFIB

## 2024-04-08 NOTE — Telephone Encounter (Signed)
 Checking percert on the following patient for testing scheduled at [facility].    CT ANGIO CHEST 01/18/2025

## 2024-04-08 NOTE — Patient Instructions (Addendum)
 Medication Instructions:  Your physician recommends that you continue on your current medications as directed. Please refer to the Current Medication list given to you today.  Labwork: None   Testing/Procedures: ZIO- Long Term Monitor Instructions   Your physician has requested you wear your ZIO patch monitor 14 days.   This is a single patch monitor.  [employer] supplies one patch monitor per enrollment.  Additional stickers are not available.   Please do not apply patch if you will be having a Nuclear Stress Test, Echocardiogram, Cardiac CT, MRI, or Chest Xray during the time frame you would be wearing the monitor. The patch cannot be worn during these tests.  You cannot remove and re-apply the ZIO XT patch monitor.   Your ZIO patch monitor will be sent USPS Priority mail from [facility] directly to your home address. The monitor may also be mailed to a PO BOX if home delivery is not available.   It may take 3-5 days to receive your monitor after you have been enrolled.   Once you have received you monitor, please review enclosed instructions.  Your monitor has already been registered assigning a specific monitor serial # to you.   Applying the monitor   Shave hair from upper left chest.   Hold abrader disc by orange tab.  Rub abrader in 40 strokes over left upper chest as indicated in your monitor instructions.   Clean area with 4 enclosed alcohol pads .  Use all pads to assure are is cleaned thoroughly.  Let dry.   Apply patch as indicated in monitor instructions.  Patch will be place under collarbone on left side of chest with arrow pointing upward.   Rub patch adhesive wings for 2 minutes.Remove white label marked "1".  Remove white label marked "2".  Rub patch adhesive wings for 2 additional minutes.   While looking in a mirror, press and release button in center of patch.  A small green light will flash 3-4 times .  This will be your only indicator the monitor has been  turned on.     Do not shower for the first 24 hours.  You may shower after the first 24 hours.   Press button if you feel a symptom. You will hear a small click.  Record Date, Time and Symptom in the Patient Log Book.   When you are ready to remove patch, follow instructions on last 2 pages of Patient Log Book.  Stick patch monitor onto last page of Patient Log Book.   Place Patient Log Book in [place] box.  Use locking tab on box and tape box closed securely.  The [employer] and [employer] has [employer] on it.  Please place in mailbox as soon as possible.  Your physician should have your test results approximately 7 days after the monitor has been mailed back to [facility].   Call [facility] Customer Care at [phone] if you have questions regarding your ZIO XT patch monitor.  Call them immediately if you see an orange light blinking on your monitor.   If your monitor falls off in less than 4 days contact our Monitor department at [phone].  If your monitor becomes loose or falls off after 4 days call [employer] at [phone] for suggestions on securing your monitor.  Follow-Up: Your physician recommends that you schedule a follow-up appointment in: 3-4 months   Any Other Special Instructions Will Be Listed Below (If Applicable).  If you need a refill on your  cardiac medications before your next appointment, please call your pharmacy.

## 2024-04-09 ENCOUNTER — Encounter

## 2024-04-09 DIAGNOSIS — R0683 Snoring: Secondary | ICD-10-CM

## 2024-04-09 DIAGNOSIS — R0600 Dyspnea, unspecified: Secondary | ICD-10-CM

## 2024-04-09 DIAGNOSIS — G4730 Sleep apnea, unspecified: Secondary | ICD-10-CM | POA: Diagnosis not present

## 2024-04-09 DIAGNOSIS — G4719 Other hypersomnia: Secondary | ICD-10-CM

## 2024-04-13 ENCOUNTER — Telehealth: Payer: Self-pay | Admitting: Nurse Practitioner

## 2024-04-13 NOTE — Telephone Encounter (Signed)
 PT came into [place] office and expressed that his heart monitor came off. It lasted for 2-3 days and he had to duck tape it on. When it came off the monitor started blinking red and now will not do anything. He wants to know if he can go ahead and mail it back.

## 2024-04-13 NOTE — Telephone Encounter (Signed)
 Patient informed and verbalized understanding of plan.

## 2024-04-13 NOTE — Telephone Encounter (Signed)
 Noted

## 2024-04-13 NOTE — Telephone Encounter (Signed)
-----   Message from Lasalle Pointer sent at 04/13/2024  4:28 PM EDT ----- Please let patient know that I have heard back from clinical pharmacist who says there are no interactions between saw palmetto and Eliquis.  He is okay to continue both medications.  Thanks!   Best, Lasalle Pointer, NP ----- Message ----- From: Sunny English, [facility] Sent: 04/13/2024   4:21 PM EDT To: Lasalle Pointer, NP  No interactions between saw palmetto and eliquis ----- Message ----- From: Lasalle Pointer, NP Sent: 04/13/2024   1:44 PM EDT To: [facility]  Patient had a question regarding saw palmetto and Eliquis to see if they can be taken together.  Wanted to run past you.  Thanks!   Best, Lasalle Pointer, NP

## 2024-04-13 NOTE — Telephone Encounter (Signed)
 Patient wore monitor for perhaps up to 3 days before his sweating caused it to come off. He will mail back today.   I will FYI provider.

## 2024-04-15 ENCOUNTER — Telehealth: Payer: Self-pay | Admitting: Nurse Practitioner

## 2024-04-15 DIAGNOSIS — I4891 Unspecified atrial fibrillation: Secondary | ICD-10-CM | POA: Diagnosis not present

## 2024-04-15 DIAGNOSIS — I4819 Other persistent atrial fibrillation: Secondary | ICD-10-CM

## 2024-04-15 DIAGNOSIS — I4892 Unspecified atrial flutter: Secondary | ICD-10-CM | POA: Diagnosis not present

## 2024-04-15 NOTE — Telephone Encounter (Signed)
 Referral placed.

## 2024-04-15 NOTE — Telephone Encounter (Signed)
-----   Message from Lasalle Pointer sent at 04/14/2024  6:23 PM EDT ----- Please refer patient to A-fib clinic. I spoke with her cardiologist who agreed with this referral. Thanks!  Best, Lasalle Pointer, NP ----- Message ----- From: Jann Melody, MD Sent: 04/14/2024   8:29 AM EDT To: Lasalle Pointer, NP  That sounds reasonable - thanks for reaching out.  Thanks, MAC ----- Message ----- From: Lasalle Pointer, NP Sent: 04/13/2024   1:43 PM EDT To: Jann Melody, MD  Wanted to see if you are okay with referral to A-fib clinic.  Patient was agreeable with this.   Thanks!   Best, Lasalle Pointer, NP

## 2024-04-22 DIAGNOSIS — K219 Gastro-esophageal reflux disease without esophagitis: Secondary | ICD-10-CM | POA: Diagnosis not present

## 2024-04-22 DIAGNOSIS — I10 Essential (primary) hypertension: Secondary | ICD-10-CM | POA: Diagnosis not present

## 2024-04-22 DIAGNOSIS — N400 Enlarged prostate without lower urinary tract symptoms: Secondary | ICD-10-CM | POA: Diagnosis not present

## 2024-04-22 DIAGNOSIS — Z125 Encounter for screening for malignant neoplasm of prostate: Secondary | ICD-10-CM | POA: Diagnosis not present

## 2024-04-22 DIAGNOSIS — I4719 Other supraventricular tachycardia: Secondary | ICD-10-CM | POA: Diagnosis not present

## 2024-04-26 DIAGNOSIS — I4891 Unspecified atrial fibrillation: Secondary | ICD-10-CM

## 2024-04-26 DIAGNOSIS — I4892 Unspecified atrial flutter: Secondary | ICD-10-CM | POA: Diagnosis not present

## 2024-04-28 DIAGNOSIS — G4733 Obstructive sleep apnea (adult) (pediatric): Secondary | ICD-10-CM | POA: Diagnosis not present

## 2024-05-03 DIAGNOSIS — Z79891 Long term (current) use of opiate analgesic: Secondary | ICD-10-CM | POA: Diagnosis not present

## 2024-05-03 DIAGNOSIS — G894 Chronic pain syndrome: Secondary | ICD-10-CM | POA: Diagnosis not present

## 2024-05-03 DIAGNOSIS — M25569 Pain in unspecified knee: Secondary | ICD-10-CM | POA: Diagnosis not present

## 2024-05-03 DIAGNOSIS — M47816 Spondylosis without myelopathy or radiculopathy, lumbar region: Secondary | ICD-10-CM | POA: Diagnosis not present

## 2024-05-03 DIAGNOSIS — Z96653 Presence of artificial knee joint, bilateral: Secondary | ICD-10-CM | POA: Diagnosis not present

## 2024-05-05 ENCOUNTER — Ambulatory Visit: Payer: Self-pay | Admitting: Nurse Practitioner

## 2024-05-05 NOTE — Progress Notes (Signed)
 Mild OSA. Can discuss treatment options at follow up. Thanks!

## 2024-05-06 ENCOUNTER — Ambulatory Visit (HOSPITAL_COMMUNITY)
Admission: RE | Admit: 2024-05-06 | Discharge: 2024-05-06 | Disposition: A | Discharge status: Home / Self Care | Source: Ambulatory Visit | Attending: Physician Assistant | Admitting: Physician Assistant

## 2024-05-06 ENCOUNTER — Encounter (HOSPITAL_COMMUNITY): Payer: Self-pay | Admitting: Physician Assistant

## 2024-05-06 VITALS — BP 134/90 | HR 92 | Ht 71.0 in | Wt 241.4 lb

## 2024-05-06 DIAGNOSIS — I4819 Other persistent atrial fibrillation: Secondary | ICD-10-CM | POA: Diagnosis not present

## 2024-05-06 DIAGNOSIS — D6869 Other thrombophilia: Secondary | ICD-10-CM

## 2024-05-06 DIAGNOSIS — Z5181 Encounter for therapeutic drug level monitoring: Secondary | ICD-10-CM

## 2024-05-06 DIAGNOSIS — Z79899 Other long term (current) drug therapy: Secondary | ICD-10-CM

## 2024-05-06 LAB — COMPREHENSIVE METABOLIC PANEL WITH GFR
ALT: 30 IU/L (ref 0–50)
AST: 20 IU/L (ref 15–59)
Albumin: 4.1 g/dL (ref 3.9–4.9)
Alkaline Phosphatase: 64 IU/L (ref 44–121)
BUN/Creatinine Ratio: 16 (ref 10–24)
BUN: 15 mg/dL (ref 8–27)
Bilirubin Total: 0.5 mg/dL (ref 0.0–1.2)
CO2: 21 mmol/L (ref 20–29)
Calcium: 9.2 mg/dL (ref 8.6–10.2)
Chloride: 106 mmol/L (ref 96–106)
Creatinine, Ser: 0.92 mg/dL (ref 0.76–1.27)
Globulin, Total: 2.4 g/dL (ref 1.5–4.5)
Glucose: 163 mg/dL — ABNORMAL HIGH (ref 70–99)
Potassium: 4.1 mmol/L (ref 3.5–5.2)
Sodium: 141 mmol/L (ref 134–144)
Total Protein: 6.5 g/dL (ref 6.0–8.5)
eGFR: 92 mL/min/{1.73_m2} (ref >59)

## 2024-05-06 LAB — CBC
Hematocrit: 43.7 % (ref 37.5–51.0)
Hemoglobin: 14.1 g/dL (ref 13.0–17.7)
MCH: 30.5 pg (ref 26.6–33.0)
MCHC: 32.3 g/dL (ref 31.5–35.7)
MCV: 94 fL (ref 79–97)
Platelets: 218 10*3/uL (ref 150–450)
RBC: 4.63 x10E6/uL (ref 4.14–5.80)
RDW: 13.8 % (ref 11.6–15.4)
WBC: 7.6 10*3/uL (ref 3.4–10.8)

## 2024-05-06 NOTE — Progress Notes (Signed)
 I called pt and there was no answer- LMOM with results, ok per DPR.

## 2024-05-06 NOTE — Patient Instructions (Signed)
 Cardioversion scheduled for: June 23 Monday    - Arrive at the [facility] ([address]  and check in with ADMITTING at 7:30   - Do not eat or drink anything after midnight the night prior to your procedure.   - Take all your morning medication (except diabetic medications) with a sip of water prior to arrival.  - Do NOT miss any doses of your blood thinner - if you should miss a dose or take a dose more than 4 hours late -- please notify our office immediately.  - You will not be able to drive home after your procedure. Please ensure you have a responsible adult to drive you home. You will need someone with you for 24 hours post procedure.     - Expect to be in the procedural area approximately 2 hours.   - If you feel as if you go back into normal rhythm prior to scheduled cardioversion, please notify our office immediately.   If your procedure is canceled in the cardioversion suite you will be charged a cancellation fee.

## 2024-05-06 NOTE — Progress Notes (Signed)
 Primary Care Physician: Veda Gerald, MD Primary Cardiologist: Jann Melody, MD Electrophysiologist: None  Referring Physician: Lasalle Pointer NP   Melvin Turner is a 68 y.o. male with a history of HTN, SVT, OSA, atrial fibrillation who presents for follow up in the [facility] Atrial Fibrillation [facility].  The patient was initially diagnosed with atrial fibrillation 02/03/24 at a visit with Dr Mallipeddi. He was started on Lopressor  and Eliquis for stroke prevention. He underwent DCCV on 03/02/24 and was started on amiodarone  post procedure. At his follow up on 04/08/24 he was back in afib. A 3 day Zio monitor showed 100% afib burden.    Patient presents today for follow up for atrial fibrillation and amiodarone  monitoring. Patient in rate controlled afib today. He does report symptoms of fatigue and intermittent dizziness. No bleeding issues on anticoagulation. He does drink 3-5 beers daily.   Today, he denies symptoms of palpitations, chest pain, shortness of breath, orthopnea, PND, lower extremity edema, presyncope, syncope, bleeding, or neurologic sequela. The patient is tolerating medications without difficulties and is otherwise without complaint today.    Atrial Fibrillation Risk Factors:  he does have symptoms or diagnosis of sleep apnea. he does not have a history of rheumatic fever. he does have a history of alcohol use. The patient does not have a history of early familial atrial fibrillation or other arrhythmias.  Atrial Fibrillation Management history:  Previous antiarrhythmic drugs: amiodarone   Previous cardioversions: 03/02/24 Previous ablations: none Anticoagulation history: Eliquis  ROS- All systems are reviewed and negative except as per the HPI above.  Past Medical History:  Diagnosis Date   Arthritis    Atrial fibrillation (HCC)    BPH (benign prostatic hyperplasia)    Dysrhythmia    GERD (gastroesophageal reflux disease)    Hypertension      Current Outpatient Medications  Medication Sig Dispense Refill   amiodarone  (PACERONE ) 200 MG tablet Take 1 tablet (200 mg total) by mouth daily. 90 tablet 0   amLODipine  (NORVASC ) 2.5 MG tablet Take 1 tablet (2.5 mg total) by mouth daily. 30 tablet 5   Ascorbic Acid  (VITAMIN C  CR) 1000 MG TBCR Take 1,000 mg by mouth daily.     citalopram (CELEXA) 20 MG tablet Take 20 mg by mouth daily.     ELIQUIS 5 MG TABS tablet Take 5 mg by mouth 2 (two) times daily.     fluticasone (FLONASE) 50 MCG/ACT nasal spray Place 1 spray into both nostrils daily as needed for allergies or rhinitis.     HYDROcodone -acetaminophen  (NORCO) 10-325 MG tablet Take 1 tablet by mouth in the morning, at noon, and at bedtime.     metoprolol  tartrate (LOPRESSOR ) 25 MG tablet Take 0.5 tablets (12.5 mg total) by mouth 2 (two) times daily. 90 tablet 3   Multiple Vitamin (MULTIVITAMIN WITH MINERALS) TABS tablet Take 1 tablet by mouth daily.     olmesartan (BENICAR) 40 MG tablet Take 40 mg by mouth daily.     Omega-3 Fatty Acids (FISH OIL OMEGA-3) 1000 MG CAPS Take 1,000 mg by mouth daily.     omeprazole (PRILOSEC) 20 MG capsule Take 20 mg by mouth daily.     Saw Palmetto 450 MG CAPS Take 450 mg by mouth 2 (two) times daily.     tamsulosin  (FLOMAX ) 0.4 MG CAPS capsule Take 0.4 mg by mouth daily.     No current facility-administered medications for this encounter.    Physical Exam: BP (!) 134/90  Pulse 92   Ht 5' 11 (1.803 m)   Wt 109.5 kg   BMI 33.67 kg/m   GEN: Well nourished, well developed in no acute distress CARDIAC: Irregularly irregular rate and rhythm, no murmurs, rubs, gallops RESPIRATORY:  Clear to auscultation without rales, wheezing or rhonchi  ABDOMEN: Soft, non-tender, non-distended EXTREMITIES:  No edema; No deformity   Wt Readings from Last 3 Encounters:  05/06/24 109.5 kg  04/08/24 110.6 kg  04/01/24 108.9 kg     EKG today demonstrates  Afib Vent. rate 92 BPM PR interval * ms QRS  duration 94 ms QT/QTcB 372/460 ms   Echo 02/18/24 demonstrated   1. Left ventricular ejection fraction, by estimation, is 55 to 60%. The  left ventricle has normal function. Left ventricular endocardial border  not optimally defined to evaluate regional wall motion. There is mild  concentric left ventricular hypertrophy. Left ventricular diastolic parameters are indeterminate.   2. Right ventricular systolic function is normal. The right ventricular  size is normal. Tricuspid regurgitation signal is inadequate for assessing  PA pressure.   3. Right atrial size was mildly dilated.   4. The mitral valve is grossly normal. Trivial mitral valve  regurgitation.   5. The aortic valve is tricuspid. There is mild calcification of the  aortic valve. Aortic valve regurgitation is not visualized. Aortic valve  sclerosis/calcification is present, without any evidence of aortic  stenosis. Aortic valve mean gradient measures 6.0 mmHg.   6. Aortic dilatation noted. There is mild dilatation of the aortic root,  measuring 42 mm. There is mild dilatation of the ascending aorta,  measuring 41 mm.   7. Unable to estimate CVP.    CHA2DS2-VASc Score = 2  The patient's score is based upon: CHF History: 0 HTN History: 1 Diabetes History: 0 Stroke History: 0 Vascular Disease History: 0 Age Score: 1 Gender Score: 0       ASSESSMENT AND PLAN: Persistent Atrial Fibrillation (ICD10:  I48.19) The patient's CHA2DS2-VASc score is 2, indicating a 2.2% annual risk of stroke.   S/p DCCV 03/02/24 with quick return of afib.  We discussed rhythm control options today. Short term, will arrange for repeat DCCV now that he has loaded longer on amiodarone . Long term, would like to avoid amiodarone  due to possible off target effects. He is agreeable to discussing ablation with EP, will refer.  Continue Eliquis 5 mg BID Continue Lopressor  12.5 mg BID We discussed lifestyle modification including reducing alcohol  intake.   Secondary Hypercoagulable State (ICD10:  D68.69) The patient is at significant risk for stroke/thromboembolism based upon his CHA2DS2-VASc Score of 2.  Continue Apixaban (Eliquis). No bleeding issues.   High Risk Medication Monitoring (ICD 10: Z79.899) Intervals on ECG acceptable for amiodarone  monitoring. Check cmet/TSH/cbc today.  HTN Stable on current regimen  OSA  Recently diagnosed with mild OSA. The importance of adequate treatment of sleep apnea was discussed today in order to improve our ability to maintain sinus rhythm long term. Followed by Girard Lam NP.    Follow up with EP to establish care post DCCV to discuss ablation.   Informed Consent   Shared Decision Making/Informed Consent The risks (stroke, cardiac arrhythmias rarely resulting in the need for a temporary or permanent pacemaker, skin irritation or burns and complications associated with conscious sedation including aspiration, arrhythmia, respiratory failure and death), benefits (restoration of normal sinus rhythm) and alternatives of a direct current cardioversion were explained in detail to Melvin Turner and he agrees to proceed.  [facility] [address] [place], [place] [address] [phone]

## 2024-05-07 ENCOUNTER — Ambulatory Visit (HOSPITAL_COMMUNITY): Payer: Self-pay | Admitting: Physician Assistant

## 2024-05-07 LAB — SPECIMEN STATUS REPORT

## 2024-05-07 LAB — TSH: TSH: 1.35 u[IU]/mL (ref 0.450–4.500)

## 2024-05-07 NOTE — Progress Notes (Signed)
 Left message for return call to review instructions for appointment on 05/10/24.

## 2024-05-07 NOTE — Progress Notes (Signed)
 Patient returned phone call. This RN gave pre-procedure instructions for Monday May 10, 2024.   Patient informed of:   Time to arrive for procedure. 0700 Remain NPO past midnight.  Must have a ride home and a responsible adult to remain with them for 24 hours post procedure.  Confirmed blood thinner. Eliquis Confirmed no breaks in taking blood thinner for 3+ weeks prior to procedure. Confirmed patient stopped all GLP-1s and GLP-2s for at least one week before procedure.

## 2024-05-09 NOTE — Anesthesia Preprocedure Evaluation (Signed)
 Anesthesia Evaluation  Patient identified by MRN, date of birth, ID band Patient awake    Reviewed: Allergy & Precautions, NPO status , Patient's Chart, lab work & pertinent test results, reviewed documented beta blocker date and time   Airway Mallampati: II  TM Distance: >3 FB Neck ROM: Full    Dental no notable dental hx. (+) Teeth Intact, Dental Advisory Given   Pulmonary former smoker   Pulmonary exam normal breath sounds clear to auscultation       Cardiovascular hypertension, Pt. on home beta blockers and Pt. on medications Normal cardiovascular exam+ dysrhythmias (eliquis) Atrial Fibrillation and Supra Ventricular Tachycardia  Rhythm:Irregular Rate:Normal  TTE 2025 1. Left ventricular ejection fraction, by estimation, is 55 to 60%. The  left ventricle has normal function. Left ventricular endocardial border  not optimally defined to evaluate regional wall motion. There is mild  concentric left ventricular  hypertrophy. Left ventricular diastolic parameters are indeterminate.   2. Right ventricular systolic function is normal. The right ventricular  size is normal. Tricuspid regurgitation signal is inadequate for assessing  PA pressure.   3. Right atrial size was mildly dilated.   4. The mitral valve is grossly normal. Trivial mitral valve  regurgitation.   5. The aortic valve is tricuspid. There is mild calcification of the  aortic valve. Aortic valve regurgitation is not visualized. Aortic valve  sclerosis/calcification is present, without any evidence of aortic  stenosis. Aortic valve mean gradient  measures 6.0 mmHg.   6. Aortic dilatation noted. There is mild dilatation of the aortic root,  measuring 42 mm. There is mild dilatation of the ascending aorta,  measuring 41 mm.   7. Unable to estimate CVP.     Neuro/Psych negative neurological ROS  negative psych ROS   GI/Hepatic ,GERD  ,,(+)     substance abuse   alcohol use  Endo/Other  negative endocrine ROS    Renal/GU negative Renal ROS  negative genitourinary   Musculoskeletal  (+) Arthritis ,    Abdominal   Peds  Hematology negative hematology ROS (+)   Anesthesia Other Findings   Reproductive/Obstetrics                             Anesthesia Physical Anesthesia Plan  ASA: 3  Anesthesia Plan: General   Post-op Pain Management:    Induction: Intravenous  PONV Risk Score and Plan: Propofol  infusion and Treatment may vary due to age or medical condition  Airway Management Planned: Natural Airway  Additional Equipment:   Intra-op Plan:   Post-operative Plan:   Informed Consent: I have reviewed the patients History and Physical, chart, labs and discussed the procedure including the risks, benefits and alternatives for the proposed anesthesia with the patient or authorized representative who has indicated his/her understanding and acceptance.     Dental advisory given  Plan Discussed with: CRNA  Anesthesia Plan Comments:        Anesthesia Quick Evaluation

## 2024-05-10 ENCOUNTER — Other Ambulatory Visit: Payer: Self-pay

## 2024-05-10 ENCOUNTER — Encounter (HOSPITAL_COMMUNITY): Payer: Self-pay | Admitting: Cardiology

## 2024-05-10 ENCOUNTER — Ambulatory Visit (HOSPITAL_COMMUNITY): Payer: Self-pay | Admitting: Anesthesiology

## 2024-05-10 ENCOUNTER — Encounter (HOSPITAL_COMMUNITY)
Admission: RE | Disposition: A | Discharge status: Home / Self Care | Payer: Self-pay | Source: Home / Self Care | Attending: Cardiology

## 2024-05-10 ENCOUNTER — Ambulatory Visit (HOSPITAL_BASED_OUTPATIENT_CLINIC_OR_DEPARTMENT_OTHER): Payer: Self-pay | Admitting: Anesthesiology

## 2024-05-10 ENCOUNTER — Ambulatory Visit (HOSPITAL_COMMUNITY)
Admission: RE | Admit: 2024-05-10 | Discharge: 2024-05-10 | Disposition: A | Discharge status: Home / Self Care | Attending: Cardiology | Admitting: Cardiology

## 2024-05-10 DIAGNOSIS — D6869 Other thrombophilia: Secondary | ICD-10-CM | POA: Diagnosis not present

## 2024-05-10 DIAGNOSIS — I10 Essential (primary) hypertension: Secondary | ICD-10-CM

## 2024-05-10 DIAGNOSIS — Z79899 Other long term (current) drug therapy: Secondary | ICD-10-CM | POA: Insufficient documentation

## 2024-05-10 DIAGNOSIS — Z87891 Personal history of nicotine dependence: Secondary | ICD-10-CM | POA: Diagnosis not present

## 2024-05-10 DIAGNOSIS — Z7901 Long term (current) use of anticoagulants: Secondary | ICD-10-CM | POA: Diagnosis not present

## 2024-05-10 DIAGNOSIS — I119 Hypertensive heart disease without heart failure: Secondary | ICD-10-CM | POA: Insufficient documentation

## 2024-05-10 DIAGNOSIS — I4819 Other persistent atrial fibrillation: Secondary | ICD-10-CM

## 2024-05-10 HISTORY — PX: CARDIOVERSION: EP1203

## 2024-05-10 SURGERY — CARDIOVERSION (CATH LAB)
Anesthesia: General

## 2024-05-10 MED ORDER — SODIUM CHLORIDE 0.9% FLUSH: 3.0000 mL | INTRAVENOUS | Status: DC | PRN

## 2024-05-10 MED ORDER — LIDOCAINE 2% (20 MG/ML) 5 ML SYRINGE
INTRAMUSCULAR | Status: DC | PRN
Start: 2024-05-10 — End: 2024-05-10
  Administered 2024-05-10: 100 mg via INTRAVENOUS

## 2024-05-10 MED ORDER — SODIUM CHLORIDE 0.9% FLUSH: 3.0000 mL | Freq: Two times a day (BID) | INTRAVENOUS | Status: DC

## 2024-05-10 MED ORDER — PROPOFOL 10 MG/ML IV BOLUS
INTRAVENOUS | Status: DC | PRN
  Administered 2024-05-10: 80 mg via INTRAVENOUS
  Administered 2024-05-10: 20 mg via INTRAVENOUS

## 2024-05-10 SURGICAL SUPPLY — 1 items: PAD DEFIB RADIO PHYSIO CONN (PAD) ×1 IMPLANT

## 2024-05-10 NOTE — H&P (Signed)
 ATRIAL FIB OFFICE VISIT 05/06/2024 Atrial Fib Clinic at [facility] Dept of [facility] [address], [place], [place] Cardiology Persistent atrial fibrillation (HCC) +2 more Dx Referred by Orpha Yancey LABOR, MD Reason for Visit   Additional Documentation  Vitals: BP 134/90 Important    Pulse 92   Ht 5' 11 (1.803 m)   Wt 109.5 kg   BMI 33.67 kg/m   BSA 2.34 m      More Vitals  Flowsheets: NEWS,   MEWS Score,   Vital Signs,   Anthropometrics,   CHADS2-VASc Score,   Method of Visit,   Data  Encounter Info: Billing Info,   History,   Allergies,   Detailed Report   All Notes   Progress Notes by Nellene Quita SAUNDERS, PA at 05/06/2024 1:30 PM  Author: Nellene Quita SAUNDERS, PA Author Type: Physician Assistant Filed: 05/06/2024  1:53 PM  Note Status: Signed Cosign: Cosign Not Required Date of Service: 05/06/2024  1:30 PM  Editor: Nellene Quita SAUNDERS, PA (Physician Assistant)             Expand All Collapse All      Primary Care Physician: Orpha Yancey LABOR, MD Primary Cardiologist: Stanly LABOR Leavens, MD Electrophysiologist: None  Referring Physician: Almarie Crate NP     Melvin Turner is a 68 y.o. male with a history of HTN, SVT, OSA, atrial fibrillation who presents for follow up in the [facility] At[facility] Fibrillation [facility].  The patient was initially diagnosed with atrial fibrillation 02/03/24 at a visit with Dr Mallipeddi. He was started on Lopressor  and Eliquis for stroke prevention. He underwent DCCV on 03/02/24 and was started on amiodarone  post procedure. At his follow up on 04/08/24 he was back in afib. A 3 day Zio monitor showed 100% afib burden.    Patient presents today for follow up for atrial fibrillation and amiodarone  monitoring. Patient in rate controlled afib today. He does report symptoms of fatigue and intermittent dizziness. No bleeding issues on anticoagulation. He does drink 3-5 beers daily.    Today, he denies symptoms of palpitations, chest  pain, shortness of breath, orthopnea, PND, lower extremity edema, presyncope, syncope, bleeding, or neurologic sequela. The patient is tolerating medications without difficulties and is otherwise without complaint today.      Atrial Fibrillation Risk Factors:   he does have symptoms or diagnosis of sleep apnea. he does not have a history of rheumatic fever. he does have a history of alcohol use. The patient does not have a history of early familial atrial fibrillation or other arrhythmias.   Atrial Fibrillation Management history:   Previous antiarrhythmic drugs: amiodarone   Previous cardioversions: 03/02/24 Previous ablations: none Anticoagulation history: Eliquis   ROS- All systems are reviewed and negative except as per the HPI above.       Past Medical History:  Diagnosis Date   Arthritis     Atrial fibrillation (HCC)     BPH (benign prostatic hyperplasia)     Dysrhythmia     GERD (gastroesophageal reflux disease)     Hypertension                  Current Outpatient Medications  Medication Sig Dispense Refill   amiodarone  (PACERONE ) 200 MG tablet Take 1 tablet (200 mg total) by mouth daily. 90 tablet 0   amLODipine  (NORVASC ) 2.5 MG tablet Take 1 tablet (2.5 mg total) by mouth daily. 30 tablet 5   Ascorbic Acid  (  VITAMIN C  CR) 1000 MG TBCR Take 1,000 mg by mouth daily.       citalopram (CELEXA) 20 MG tablet Take 20 mg by mouth daily.       ELIQUIS 5 MG TABS tablet Take 5 mg by mouth 2 (two) times daily.       fluticasone (FLONASE) 50 MCG/ACT nasal spray Place 1 spray into both nostrils daily as needed for allergies or rhinitis.       HYDROcodone -acetaminophen  (NORCO) 10-325 MG tablet Take 1 tablet by mouth in the morning, at noon, and at bedtime.       metoprolol  tartrate (LOPRESSOR ) 25 MG tablet Take 0.5 tablets (12.5 mg total) by mouth 2 (two) times daily. 90 tablet 3   Multiple Vitamin (MULTIVITAMIN WITH MINERALS) TABS tablet Take 1 tablet by mouth daily.        olmesartan (BENICAR) 40 MG tablet Take 40 mg by mouth daily.       Omega-3 Fatty Acids (FISH OIL OMEGA-3) 1000 MG CAPS Take 1,000 mg by mouth daily.       omeprazole (PRILOSEC) 20 MG capsule Take 20 mg by mouth daily.       Saw Palmetto 450 MG CAPS Take 450 mg by mouth 2 (two) times daily.       tamsulosin  (FLOMAX ) 0.4 MG CAPS capsule Take 0.4 mg by mouth daily.          No current facility-administered medications for this encounter.        Physical Exam: BP (!) 134/90   Pulse 92   Ht 5' 11 (1.803 m)   Wt 109.5 kg   BMI 33.67 kg/m    GEN: Well nourished, well developed in no acute distress CARDIAC: Irregularly irregular rate and rhythm, no murmurs, rubs, gallops RESPIRATORY:  Clear to auscultation without rales, wheezing or rhonchi  ABDOMEN: Soft, non-tender, non-distended EXTREMITIES:  No edema; No deformity       Wt Readings from Last 3 Encounters:  05/06/24 109.5 kg  04/08/24 110.6 kg  04/01/24 108.9 kg      EKG today demonstrates  Afib Vent. rate 92 BPM PR interval * ms QRS duration 94 ms QT/QTcB 372/460 ms     Echo 02/18/24 demonstrated   1. Left ventricular ejection fraction, by estimation, is 55 to 60%. The  left ventricle has normal function. Left ventricular endocardial border  not optimally defined to evaluate regional wall motion. There is mild  concentric left ventricular hypertrophy. Left ventricular diastolic parameters are indeterminate.   2. Right ventricular systolic function is normal. The right ventricular  size is normal. Tricuspid regurgitation signal is inadequate for assessing  PA pressure.   3. Right atrial size was mildly dilated.   4. The mitral valve is grossly normal. Trivial mitral valve  regurgitation.   5. The aortic valve is tricuspid. There is mild calcification of the  aortic valve. Aortic valve regurgitation is not visualized. Aortic valve  sclerosis/calcification is present, without any evidence of aortic  stenosis. Aortic  valve mean gradient measures 6.0 mmHg.   6. Aortic dilatation noted. There is mild dilatation of the aortic root,  measuring 42 mm. There is mild dilatation of the ascending aorta,  measuring 41 mm.   7. Unable to estimate CVP.      CHA2DS2-VASc Score = 2  The patient's score is based upon: CHF History: 0 HTN History: 1 Diabetes History: 0 Stroke History: 0 Vascular Disease History: 0 Age Score: 1 Gender Score: 0  ASSESSMENT AND PLAN: Persistent Atrial Fibrillation (ICD10:  I48.19) The patient's CHA2DS2-VASc score is 2, indicating a 2.2% annual risk of stroke.   S/p DCCV 03/02/24 with quick return of afib.  We discussed rhythm control options today. Short term, will arrange for repeat DCCV now that he has loaded longer on amiodarone . Long term, would like to avoid amiodarone  due to possible off target effects. He is agreeable to discussing ablation with EP, will refer.  Continue Eliquis 5 mg BID Continue Lopressor  12.5 mg BID We discussed lifestyle modification including reducing alcohol intake.    Secondary Hypercoagulable State (ICD10:  D68.69) The patient is at significant risk for stroke/thromboembolism based upon his CHA2DS2-VASc Score of 2.  Continue Apixaban (Eliquis). No bleeding issues.    High Risk Medication Monitoring (ICD 10: Z79.899) Intervals on ECG acceptable for amiodarone  monitoring. Check cmet/TSH/cbc today.   HTN Stable on current regimen   OSA  Recently diagnosed with mild OSA. The importance of adequate treatment of sleep apnea was discussed today in order to improve our ability to maintain sinus rhythm long term. Followed by Comer Rouleau NP.      Follow up with EP to establish care post DCCV to discuss ablation.    Informed Consent Shared Decision Making/Informed Consent The risks (stroke, cardiac arrhythmias rarely resulting in the need for a temporary or permanent pacemaker, skin irritation or burns and complications associated with  conscious sedation including aspiration, arrhythmia, respiratory failure and death), benefits (restoration of normal sinus rhythm) and alternatives of a direct current cardioversion were explained in detail to Mr. Neto and he agrees to proceed.             [facility] [address] [place], [place] [address] [phone]      For DCCV; compliant with apixaban; no changes Redell Shallow

## 2024-05-10 NOTE — Discharge Instructions (Signed)

## 2024-05-10 NOTE — Procedures (Signed)
 Electrical Cardioversion Procedure Note KANIN LIA [id number] [birth year]  Procedure: Electrical Cardioversion Indications:  Atrial Fibrillation  Procedure Details Consent: Risks of procedure as well as the alternatives and risks of each were explained to the (patient/caregiver).  Consent for procedure obtained. Time Out: Verified patient identification, verified procedure, site/side was marked, verified correct patient position, special equipment/implants available, medications/allergies/relevent history reviewed, required imaging and test results available.  Performed  Patient placed on cardiac monitor, pulse oximetry, supplemental oxygen as necessary.  Sedation given: Pt sedated by anesthesia with lidocaine  100 mg and diprovan 100 mg IV. Pacer pads placed anterior and posterior chest.  Cardioverted 1 time(s).  Cardioverted at 300J.  Evaluation Findings: Post procedure EKG shows: NSR Complications: None Patient did tolerate procedure well.   Redell Shallow 05/10/2024, 7:21 AM

## 2024-05-10 NOTE — Transfer of Care (Signed)
 Immediate Anesthesia Transfer of Care Note  Patient: Melvin Turner  Procedure(s) Performed: CARDIOVERSION  Patient Location: Cath Lab  Anesthesia Type:General  Level of Consciousness: awake, alert , and oriented  Airway & Oxygen Therapy: Patient Spontanous Breathing and Patient connected to nasal cannula oxygen  Post-op Assessment: Report given to RN and Post -op Vital signs reviewed and stable  Post vital signs: Reviewed and stable  Last Vitals:  Vitals Value Taken Time  BP    Temp    Pulse 74 05/10/24 07:59  Resp 17 05/10/24 07:59  SpO2 96 % 05/10/24 07:59  Vitals shown include unfiled device data.  Last Pain:  Vitals:   05/10/24 0732  TempSrc:   PainSc: 0-No pain         Complications: No notable events documented.

## 2024-05-11 NOTE — Anesthesia Postprocedure Evaluation (Signed)
 Anesthesia Post Note  Patient: Melvin Turner  Procedure(s) Performed: CARDIOVERSION     Patient location during evaluation: Specials Recovery Anesthesia Type: General Level of consciousness: awake and alert Pain management: pain level controlled Vital Signs Assessment: post-procedure vital signs reviewed and stable Respiratory status: spontaneous breathing, nonlabored ventilation, respiratory function stable and patient connected to nasal cannula oxygen Cardiovascular status: blood pressure returned to baseline and stable Postop Assessment: no apparent nausea or vomiting Anesthetic complications: no  No notable events documented.  Last Vitals:  Vitals:   05/10/24 0835 05/10/24 0840  BP: 109/71 117/80  Pulse: (!) 52 (!) 56  Resp: 13 14  Temp:  36.8 C  SpO2: 95% 97%    Last Pain:  Vitals:   05/10/24 0840  TempSrc: Temporal  PainSc: 0-No pain   Pain Goal:                   Talley Casco L Inge Waldroup

## 2024-05-13 ENCOUNTER — Ambulatory Visit: Payer: Self-pay | Admitting: Nurse Practitioner

## 2024-05-18 ENCOUNTER — Encounter (HOSPITAL_COMMUNITY): Payer: Self-pay

## 2024-05-18 ENCOUNTER — Emergency Department (HOSPITAL_COMMUNITY)

## 2024-05-18 ENCOUNTER — Other Ambulatory Visit: Payer: Self-pay

## 2024-05-18 ENCOUNTER — Emergency Department (HOSPITAL_COMMUNITY)
Admission: EM | Admit: 2024-05-18 | Discharge: 2024-05-18 | Disposition: A | Discharge status: Home / Self Care | Attending: Emergency Medicine | Admitting: Emergency Medicine

## 2024-05-18 DIAGNOSIS — M5412 Radiculopathy, cervical region: Secondary | ICD-10-CM | POA: Insufficient documentation

## 2024-05-18 DIAGNOSIS — M25512 Pain in left shoulder: Secondary | ICD-10-CM | POA: Diagnosis not present

## 2024-05-18 DIAGNOSIS — M47812 Spondylosis without myelopathy or radiculopathy, cervical region: Secondary | ICD-10-CM | POA: Diagnosis not present

## 2024-05-18 DIAGNOSIS — M542 Cervicalgia: Secondary | ICD-10-CM | POA: Diagnosis not present

## 2024-05-18 DIAGNOSIS — Z7901 Long term (current) use of anticoagulants: Secondary | ICD-10-CM | POA: Diagnosis not present

## 2024-05-18 DIAGNOSIS — M4802 Spinal stenosis, cervical region: Secondary | ICD-10-CM | POA: Diagnosis not present

## 2024-05-18 LAB — BASIC METABOLIC PANEL WITH GFR
Anion gap: 11 (ref 5–15)
BUN: 22 mg/dL (ref 8–23)
CO2: 23 mmol/L (ref 22–32)
Calcium: 9 mg/dL (ref 8.9–10.3)
Chloride: 105 mmol/L (ref 98–111)
Creatinine, Ser: 0.88 mg/dL (ref 0.61–1.24)
GFR, Estimated: >60 mL/min (ref >60)
Glucose, Bld: 128 mg/dL — ABNORMAL HIGH (ref 70–99)
Potassium: 4.1 mmol/L (ref 3.5–5.1)
Sodium: 139 mmol/L (ref 135–145)

## 2024-05-18 LAB — CBC
HCT: 43.7 % (ref 39.0–52.0)
Hemoglobin: 14.1 g/dL (ref 13.0–17.0)
MCH: 30.7 pg (ref 26.0–34.0)
MCHC: 32.3 g/dL (ref 30.0–36.0)
MCV: 95.2 fL (ref 80.0–100.0)
Platelets: 208 10*3/uL (ref 150–400)
RBC: 4.59 MIL/uL (ref 4.22–5.81)
RDW: 13.5 % (ref 11.5–15.5)
WBC: 8.7 10*3/uL (ref 4.0–10.5)
nRBC: 0 % (ref 0.0–0.2)

## 2024-05-18 LAB — TROPONIN I (HIGH SENSITIVITY)
Troponin I (High Sensitivity): 3 ng/L (ref <18)
Troponin I (High Sensitivity): 3 ng/L (ref <18)

## 2024-05-18 MED ORDER — PREDNISONE 20 MG PO TABS: ORAL_TABLET | ORAL | 0 refills | Status: DC

## 2024-05-18 NOTE — ED Triage Notes (Signed)
 Pt arrived via POV c/o left shoulder, neck and arm pain X 1 week. Pt reports A-Fib with recent cardioversion. Pt reports pain began shortly after leaving the hospital from his last cardioversion.

## 2024-05-18 NOTE — Discharge Instructions (Signed)
 Follow-up with Dr. Cabbell for your neck problems

## 2024-05-18 NOTE — ED Notes (Signed)
 See triage notes. Pt c/o pain to posterior/lateral left side of neck radiating down left arm with radiation to central chest at times per pt. Color wnl. Non diaphoretic. Pt in A fib when placed on cardiac monitor. No peripheral swelling/shob noted.

## 2024-05-19 NOTE — ED Provider Notes (Signed)
 [place] [facility] AT [facility] Provider Note   CSN: [id number] Arrival date & time: 05/18/24  1313     Patient presents with: Shoulder Pain (Left side)   Melvin Turner is a 68 y.o. male.   Patient complains of pain in his left side of his neck radiating down his left arm.  The history is provided by the patient and medical records. No language interpreter was used.  Shoulder Pain Location:  Arm Arm location:  L arm Dislocation: no   Relieved by:  Nothing Worsened by:  Nothing Ineffective treatments:  None tried Associated symptoms: no back pain and no fatigue        Prior to Admission medications   Medication Sig Start Date End Date Taking? Authorizing Provider  predniSONE  (DELTASONE ) 20 MG tablet 2 tabs po daily x 3 days 05/18/24  Yes Lesha Jager, MD  amiodarone  (PACERONE ) 200 MG tablet Take 1 tablet (200 mg total) by mouth daily. 03/24/24   Mallipeddi, Vishnu P, MD  amLODipine  (NORVASC ) 2.5 MG tablet Take 1 tablet (2.5 mg total) by mouth daily. 02/03/24   Mallipeddi, Vishnu P, MD  Ascorbic Acid  (VITAMIN C  CR) 1000 MG TBCR Take 2,000 mg by mouth daily.    [provider]  citalopram (CELEXA) 20 MG tablet Take 20 mg by mouth daily. 04/22/24   [provider]  ELIQUIS 5 MG TABS tablet Take 5 mg by mouth 2 (two) times daily. 01/15/24   [provider]  fluticasone (FLONASE) 50 MCG/ACT nasal spray Place 1 spray into both nostrils daily as needed for allergies or rhinitis.    [provider]  HYDROcodone -acetaminophen  (NORCO) 10-325 MG tablet Take 1 tablet by mouth 3 (three) times daily as needed for moderate pain (pain score 4-6) or severe pain (pain score 7-10). 02/03/20   [provider]  metoprolol  tartrate (LOPRESSOR ) 25 MG tablet Take 0.5 tablets (12.5 mg total) by mouth 2 (two) times daily. 03/02/24   Mallipeddi, Vishnu P, MD  Multiple Vitamin (MULTIVITAMIN WITH MINERALS) TABS tablet Take 1 tablet by mouth daily.     [provider]  olmesartan (BENICAR) 40 MG tablet Take 40 mg by mouth daily.    [provider]  Omega-3 Fatty Acids (FISH OIL OMEGA-3) 1000 MG CAPS Take 3,000 mg by mouth daily.    [provider]  omeprazole (PRILOSEC) 20 MG capsule Take 20 mg by mouth daily. 09/12/17   [provider]  Saw Palmetto 450 MG CAPS Take 450 mg by mouth 2 (two) times daily.    [provider]  tamsulosin  (FLOMAX ) 0.4 MG CAPS capsule Take 0.4 mg by mouth daily.    [provider]    Allergies: Alpha-gal and Poison ivy extract [poison ivy extract]    Review of Systems  Constitutional:  Negative for appetite change and fatigue.  HENT:  Negative for congestion, ear discharge and sinus pressure.        Neck pain  Eyes:  Negative for discharge.  Respiratory:  Negative for cough.   Cardiovascular:  Negative for chest pain.  Gastrointestinal:  Negative for abdominal pain and diarrhea.  Genitourinary:  Negative for frequency and hematuria.  Musculoskeletal:  Negative for back pain.  Skin:  Negative for rash.  Neurological:  Negative for seizures and headaches.       Pain and numbness in left arm  Psychiatric/Behavioral:  Negative for hallucinations.     Updated Vital Signs BP 113/75   Pulse 79  Temp (!) 97.2 F (36.2 C)   Resp 13   Ht 5' 11 (1.803 m)   Wt 109.3 kg   SpO2 95%   BMI 33.61 kg/m   Physical Exam Vitals and nursing note reviewed.  Constitutional:      Appearance: He is well-developed.  HENT:     Head: Normocephalic.     Nose: Nose normal.  Eyes:     General: No scleral icterus.    Conjunctiva/sclera: Conjunctivae normal.  Neck:     Thyroid: No thyromegaly.  Cardiovascular:     Rate and Rhythm: Normal rate and regular rhythm.     Heart sounds: No murmur heard.    No friction rub. No gallop.  Pulmonary:     Breath sounds: No stridor. No wheezing or rales.  Chest:     Chest wall: No tenderness.  Abdominal:     General:  There is no distension.     Tenderness: There is no abdominal tenderness. There is no rebound.  Musculoskeletal:        General: Normal range of motion.     Cervical back: Neck supple.     Comments: Mild numbness in left arm with no decreased strength  Lymphadenopathy:     Cervical: No cervical adenopathy.  Skin:    Findings: No erythema or rash.  Neurological:     Mental Status: He is alert and oriented to person, place, and time.     Motor: No abnormal muscle tone.     Coordination: Coordination normal.  Psychiatric:        Behavior: Behavior normal.     (all labs ordered are listed, but only abnormal results are displayed) Labs Reviewed  BASIC METABOLIC PANEL WITH GFR - Abnormal; Notable for the following components:      Result Value   Glucose, Bld 128 (*)    All other components within normal limits  CBC  TROPONIN I (HIGH SENSITIVITY)  TROPONIN I (HIGH SENSITIVITY)    EKG: EKG Interpretation Date/Time:  Tuesday May 18 2024 14:42:04 EDT Ventricular Rate:  89 PR Interval:    QRS Duration:  89 QT Interval:  386 QTC Calculation: 457 R Axis:   89  Text Interpretation: Atrial fibrillation Non-specific ST-t changes Confirmed by Bernard Drivers ([id number]) on 05/18/2024 2:55:57 PM  Radiology: [facility] Cervical Spine Complete Result Date: 05/18/2024 CLINICAL DATA:  Posterolateral left neck pain radiating down the left arm. No reported injury. EXAM: CERVICAL SPINE - COMPLETE 4+ VIEW COMPARISON:  None Available. FINDINGS: Reversal of the normal lordosis. Extensive multilevel degenerative changes. Mild-to-moderate foraminal stenosis on the left at the C3-4 and C5-6 levels with moderate to marked foraminal stenosis on the left at the C6-7 level. Mild-to-moderate foraminal stenosis on the right at the C3-4, C6-7 and C7-T1 levels. Moderate to marked foraminal stenosis on the right at the C4-5 level. No fractures or subluxations. IMPRESSION: Extensive multilevel degenerative changes, as described  above. Electronically Signed   By: Elspeth Bathe M.D.   On: 05/18/2024 16:21   DG Chest 2 View Result Date: 05/18/2024 CLINICAL DATA:  cp EXAM: CHEST - 2 VIEW COMPARISON:  None available. FINDINGS: No focal airspace consolidation, pleural effusion, or pneumothorax. No cardiomegaly.No acute fracture or destructive lesion. Multilevel thoracic osteophytosis. IMPRESSION: No acute cardiopulmonary abnormality. Electronically Signed   By: Rogelia Myers M.D.   On: 05/18/2024 14:41     Procedures   Medications Ordered in the ED - No data to display Patient with severe degenerative changes in cervical  spine                                 Medical Decision Making Amount and/or Complexity of Data Reviewed Labs: ordered. Radiology: ordered.  Risk Prescription drug management.   Patient with cervical radiculopathy.  He is placed on prednisone  and referred to neurosurgery     Final diagnoses:  Cervical radiculopathy    ED Discharge Orders          Ordered    predniSONE  (DELTASONE ) 20 MG tablet        05/18/24 1903               Suzette Pac, MD 05/19/24 1130

## 2024-05-24 ENCOUNTER — Encounter: Payer: Self-pay | Admitting: Cardiovascular Disease

## 2024-05-24 ENCOUNTER — Ambulatory Visit: Attending: Cardiovascular Disease | Admitting: Cardiovascular Disease

## 2024-05-24 VITALS — BP 130/84 | HR 82 | Ht 71.0 in | Wt 238.1 lb

## 2024-05-24 DIAGNOSIS — Z79899 Other long term (current) drug therapy: Secondary | ICD-10-CM

## 2024-05-24 DIAGNOSIS — I471 Supraventricular tachycardia, unspecified: Secondary | ICD-10-CM

## 2024-05-24 DIAGNOSIS — I4819 Other persistent atrial fibrillation: Secondary | ICD-10-CM | POA: Diagnosis not present

## 2024-05-24 DIAGNOSIS — Z01812 Encounter for preprocedural laboratory examination: Secondary | ICD-10-CM | POA: Diagnosis not present

## 2024-05-24 DIAGNOSIS — Z5181 Encounter for therapeutic drug level monitoring: Secondary | ICD-10-CM

## 2024-05-24 NOTE — Progress Notes (Signed)
 Electrophysiology Office Note:    Date:  05/24/2024   ID:  Melvin Turner, DOB 1956/02/27, MRN [id number]  PCP:  Orpha Yancey LABOR, MD   [facility] Providers Cardiologist:  Stanly LABOR Leavens, MD     Referring MD: Nellene Quita SAUNDERS, PA   History of Present Illness:    Melvin Turner is a 68 y.o. male with a medical history significant for atrial fibrillation and flutter, sleep apnea, hypertension, referred for arrhythmia management.      Discussed the use of AI scribe software for clinical note transcription with the patient, who gave verbal consent to proceed.  History of Present Illness The patient is a 68 year old with atrial fibrillation and atrial flutter who presents for arrhythmia management.  He was initially diagnosed with atrial fibrillation in March 2025 and underwent DC cardioversion in April 2025. He was started on amiodarone , but experienced a recurrence of atrial fibrillation by May 2025 and has persistently been in atrial fibrillation since then. He notes occasional palpitations but generally does not notice significant symptoms. There has been no change in symptoms since starting amiodarone .  He has a history of atrial flutter, initially identified as SVT, diagnosed in March 2023. During a visit to his pain doctor, his pulse was noted to be elevated, leading to further evaluation and diagnosis. He recalls being taken to the hospital due to concerns about his heart rhythm at that time.  He consumes three to five beers daily and rides a motorcycle, specifically a [employer]. He mentions not feeling as energetic as he did in his fifties, attributing this to aging.  He is currently on Eliquis to prevent stroke due to his atrial fibrillation. A transthoracic echocardiogram (TTE) in April 2025 showed LVEF 55-60%, mild LVH, mild aortic dilation, mildly dilated right atrium, and normal-sized left atrium. In May 2025, he had a 100% atrial fibrillation burden with heart  rates ranging from 46 to 146 beats per minute, averaging 82 beats per minute.         Today, he is at baseline, feels slightly fatigued.  EKGs/Labs/Other Studies Reviewed Today:     Echocardiogram:  TTE April 2025 LVEF 55 to 60%.  Mild concentric LVH.  Right atrium mildly dilated.  Left atrium is normal in size.  Mild aortic dilation.   Monitors:  4 day monitor May 2025-- my interpretation Atrial fibrillation 100% burden Heart rate 46 to 146 bpm, average 82 bpm    EKG:   EKG Interpretation Date/Time:  Monday May 24 2024 12:24:44 EDT Ventricular Rate:  82 PR Interval:    QRS Duration:  90 QT Interval:  382 QTC Calculation: 446 R Axis:   97  Text Interpretation: Atrial fibrillation with premature ventricular or aberrantly conducted complexes Rightward axis Septal infarct , age undetermined When compared with ECG of 18-May-2024 14:42, PVC is present Confirmed by Nancey Scotts [phone]) on 05/24/2024 12:37:45 PM     Physical Exam:    VS:  BP 130/84 (BP Location: Right Arm, Patient Position: Sitting, Cuff Size: Large)   Pulse 82   Ht 5' 11 (1.803 m)   Wt 238 lb 1.6 oz (108 kg)   SpO2 94%   BMI 33.21 kg/m     Wt Readings from Last 3 Encounters:  05/24/24 238 lb 1.6 oz (108 kg)  05/18/24 241 lb (109.3 kg)  05/06/24 241 lb 6.4 oz (109.5 kg)     GEN: Well nourished, well developed in no acute distress CARDIAC: iRRR, no  murmurs, rubs, gallops RESPIRATORY:  Normal work of breathing MUSCULOSKELETAL: no edema    ASSESSMENT & PLAN:     Persistent atrial fibrillation Rapid return of atrial fibrillation after cardioversion Loaded with amiodarone  and again had recurrence of atrial fibrillation despite amiodarone  and repeat DCCV Having mild fatigue with AF We discussed management options, and using a shared decision making approach, opted to proceed with ablation.  We discussed the indication, rationale, logistics, anticipated benefits, and potential risks of the  ablation procedure including but not limited to -- bleed at the groin access site, chest pain, damage to nearby organs such as the diaphragm, lungs, or esophagus, need for a drainage tube, or prolonged hospitalization. I explained that the risk for stroke, heart attack, need for open chest surgery, or even death is very low but not zero. he  expressed understanding and wishes to proceed.   Secondary hypercoagulable state CHA2DS2-VASc score is 2 Continue apixaban 5 mg twice daily  Atrial flutter On EKG March 2023 Erroneously diagnosis of SVT in the past Will plan to perform CTI line at the time of his ablation procedure.   Signed, Eulas FORBES Furbish, MD  05/24/2024 12:59 PM    [facility]

## 2024-05-24 NOTE — Patient Instructions (Signed)
 Medication Instructions:  Your physician recommends that you continue on your current medications as directed. Please refer to the Current Medication list given to you today.  *If you need a refill on your cardiac medications before your next appointment, please call your pharmacy*  Lab Work: CBC and BMET - please have pre-procedure lab work completed on Thursday, August 21,2025 . This can be done at ANY [facility] near you - no appointment required and this does not have to be fasting. If you have labs (blood work) drawn today and your tests are completely normal, you will receive your results only by: MyChart Message (if you have MyChart) OR A paper copy in the mail If you have any lab test that is abnormal or we need to change your treatment, we will call you to review the results.  Testing/Procedures: Cardiac CT - someone will contact you to schedule this  Your physician has requested that you have cardiac CT. Cardiac computed tomography (CT) is a painless test that uses an x-ray machine to take clear, detailed pictures of your heart. For further information please visit [email or web address] Please follow instruction sheet as given.   Atrial Fibrillation Ablation - scheduled on Wednesday, August 04, 2024.  We will be in contact closer to your ablation date with further instructions Your physician has recommended that you have an ablation. Catheter ablation is a medical procedure used to treat some cardiac arrhythmias (irregular heartbeats). During catheter ablation, a long, thin, flexible tube is put into a blood vessel in your groin (upper thigh), or neck. This tube is called an ablation catheter. It is then guided to your heart through the blood vessel. Radio frequency waves destroy small areas of heart tissue where abnormal heartbeats may cause an arrhythmia to start. Please see the instruction sheet given to you today.  Follow-Up: At [facility], you and your health needs are  our priority.  As part of our continuing mission to provide you with exceptional heart care, our providers are all part of one team.  This team includes your primary Cardiologist (physician) and Advanced Practice Providers or APPs (Physician Assistants and Nurse Practitioners) who all work together to provide you with the care you need, when you need it.  Your next appointment:   We will schedule follow up after your ablation  Provider:   Dr Nancey   Cardiac Ablation Cardiac ablation is a procedure to destroy, or ablate, a small amount of heart tissue that is causing problems. The heart has many electrical connections. Sometimes, these connections are abnormal and can cause the heart to beat very fast or irregularly. Ablating the abnormal areas can improve the heart's rhythm or return it to normal. Ablation may be done for people who: Have irregular or rapid heartbeats (arrhythmias). Have Wolff-Parkinson-White syndrome. Have taken medicines for an arrhythmia that did not work or caused side effects. Have a high-risk heartbeat that may be life-threatening. Tell a health care provider about: Any allergies you have. All medicines you are taking, including vitamins, herbs, eye drops, creams, and over-the-counter medicines. Any problems you or family members have had with anesthesia. Any bleeding problems you have. Any surgeries you have had. Any medical conditions you have. Whether you are pregnant or may be pregnant. What are the risks? Your health care provider will talk with you about risks. These may include: Infection. Bruising and bleeding. Stroke or blood clots. Damage to nearby structures or organs. Allergic reaction to medicines or dyes. Needing a pacemaker if the heart  gets damaged. A pacemaker is a device that helps the heart beat normally. Failure of the procedure. A repeat procedure may be needed. What happens before the procedure? Medicines Ask your health care provider  about: Changing or stopping your regular medicines. These include any heart rhythm medicines, diabetes medicines, or blood thinners you take. Taking medicines such as aspirin  and ibuprofen . These medicines can thin your blood. Do not take them unless your health care provider tells you to. Taking over-the-counter medicines, vitamins, herbs, and supplements. General instructions Follow instructions from your health care provider about what you may eat and drink. If you will be going home right after the procedure, plan to have a responsible adult: Take you home from the hospital or clinic. You will not be allowed to drive. Care for you for the time you are told. Ask your health care provider what steps will be taken to prevent infection. What happens during the procedure?  An IV will be inserted into one of your veins. You may be given: A sedative. This helps you relax. Anesthesia. This will: Numb certain areas of your body. An incision will be made in your neck or your groin. A needle will be inserted through the incision and into a large vein in your neck or groin. The small, thin tube (catheter) will be inserted through the needle and moved to your heart. A type of X-ray (fluoroscopy) will be used to help guide the catheter and provide images of the heart on a monitor. Dye may be injected through the catheter to help your surgeon see the area of the heart that needs treatment. Electrical currents will be sent from the catheter to destroy heart tissue in certain areas. There are three types of energy that may be used to do this: Heat (radiofrequency energy). Laser energy. Extreme cold (cryoablation). When the tissue has been destroyed, the catheter will be removed. Pressure will be held on the insertion area to prevent bleeding. A bandage (dressing) will be placed over the insertion area. The procedure may vary among health care providers and hospitals. What happens after the  procedure? Your blood pressure, heart rate and rhythm, breathing rate, and blood oxygen level will be monitored until you leave the hospital or clinic. Your insertion area will be checked for bleeding. You will need to lie still for a few hours. If your groin was used, you will need to keep your leg straight for a few hours after the catheter is removed. This information is not intended to replace advice given to you by your health care provider. Make sure you discuss any questions you have with your health care provider. Document Revised: 04/23/2022 Document Reviewed: 04/23/2022 Elsevier Patient Education  2024 [employer].

## 2024-05-25 NOTE — Addendum Note (Signed)
 Addended by: CASIMIR ALDONA BRAVO on: 05/25/2024 02:04 PM   Modules accepted: Orders

## 2024-06-18 ENCOUNTER — Other Ambulatory Visit: Payer: Self-pay | Admitting: Internal Medicine

## 2024-06-29 DIAGNOSIS — K0262 Dental caries on smooth surface penetrating into dentin: Secondary | ICD-10-CM | POA: Diagnosis not present

## 2024-07-05 DIAGNOSIS — Z79891 Long term (current) use of opiate analgesic: Secondary | ICD-10-CM | POA: Diagnosis not present

## 2024-07-05 DIAGNOSIS — M545 Low back pain, unspecified: Secondary | ICD-10-CM | POA: Diagnosis not present

## 2024-07-05 DIAGNOSIS — M542 Cervicalgia: Secondary | ICD-10-CM | POA: Diagnosis not present

## 2024-07-05 DIAGNOSIS — G894 Chronic pain syndrome: Secondary | ICD-10-CM | POA: Diagnosis not present

## 2024-07-07 ENCOUNTER — Telehealth: Payer: Self-pay

## 2024-07-07 NOTE — Telephone Encounter (Signed)
 Called pt and went over CT/Ablation instructions.   CT is scheduled 8/27 at 11:30 am - pt will have labs done same day.  Ablation is scheduled 9/17 at 8:30 am with Dr. Nancey.   Pt will pick up Instruction letter when he comes for CT.

## 2024-07-10 ENCOUNTER — Encounter (HOSPITAL_COMMUNITY): Payer: Self-pay

## 2024-07-10 ENCOUNTER — Other Ambulatory Visit: Payer: Self-pay

## 2024-07-10 ENCOUNTER — Emergency Department (HOSPITAL_COMMUNITY): Admission: EM | Admit: 2024-07-10 | Discharge: 2024-07-10 | Disposition: A | Discharge status: Home / Self Care

## 2024-07-10 DIAGNOSIS — Z79899 Other long term (current) drug therapy: Secondary | ICD-10-CM | POA: Insufficient documentation

## 2024-07-10 DIAGNOSIS — I10 Essential (primary) hypertension: Secondary | ICD-10-CM | POA: Insufficient documentation

## 2024-07-10 DIAGNOSIS — Z7901 Long term (current) use of anticoagulants: Secondary | ICD-10-CM | POA: Diagnosis not present

## 2024-07-10 DIAGNOSIS — M542 Cervicalgia: Secondary | ICD-10-CM | POA: Diagnosis not present

## 2024-07-10 DIAGNOSIS — M5412 Radiculopathy, cervical region: Secondary | ICD-10-CM | POA: Diagnosis not present

## 2024-07-10 MED ORDER — METHOCARBAMOL 500 MG PO TABS
1000.0000 mg | ORAL_TABLET | Freq: Once | ORAL | Status: AC
  Administered 2024-07-10: 1000 mg via ORAL
  Filled 2024-07-10: qty 2

## 2024-07-10 MED ORDER — LIDOCAINE 5 % EX PTCH
1.0000 | MEDICATED_PATCH | CUTANEOUS | 0 refills | Status: DC
Start: 2024-07-10 — End: 2024-09-01

## 2024-07-10 MED ORDER — PREDNISONE 20 MG PO TABS: 40.0000 mg | ORAL_TABLET | Freq: Every day | ORAL | 0 refills | Status: DC

## 2024-07-10 MED ORDER — LIDOCAINE 5 % EX PTCH
1.0000 | MEDICATED_PATCH | CUTANEOUS | 0 refills | Status: DC
Start: 2024-07-10 — End: 2024-07-10

## 2024-07-10 MED ORDER — DEXAMETHASONE SODIUM PHOSPHATE 10 MG/ML IJ SOLN
10.0000 mg | Freq: Once | INTRAMUSCULAR | Status: AC
  Administered 2024-07-10: 10 mg via INTRAMUSCULAR
  Filled 2024-07-10: qty 1

## 2024-07-10 MED ORDER — LIDOCAINE 5 % EX PTCH
1.0000 | MEDICATED_PATCH | CUTANEOUS | Status: DC
Start: 2024-07-10 — End: 2024-07-10
  Administered 2024-07-10: 1 via TRANSDERMAL
  Filled 2024-07-10: qty 1

## 2024-07-10 MED ORDER — METHOCARBAMOL 500 MG PO TABS: 500.0000 mg | ORAL_TABLET | Freq: Two times a day (BID) | ORAL | 0 refills | Status: AC

## 2024-07-10 MED ORDER — METHOCARBAMOL 500 MG PO TABS
500.0000 mg | ORAL_TABLET | Freq: Two times a day (BID) | ORAL | 0 refills | Status: DC
Start: 2024-07-10 — End: 2024-07-10

## 2024-07-10 NOTE — ED Notes (Signed)
EDPA Provider at bedside. 

## 2024-07-10 NOTE — ED Provider Notes (Signed)
 [place] EMERGENCY DEPARTMENT AT [facility] Provider Note   CSN: [id number] Arrival date & time: 07/10/24  1420     Patient presents with: Neck Pain   Melvin Turner is a 68 y.o. male patient with past medical history of persistent A-fib on Eliquis, hypertension presented to emergency room with complaint of left lateral neck and shoulder pain that has been ongoing since mid June.  He was seen here for similar symptoms and initially had relief after steroids but pain has returned.  Patient denies any numbness or tingling in upper extremity, no weakness in upper extremity.  Pain is worse when left lateral flexion of neck.  Denies chest pain or shortness of breath.  No swelling or rash of upper extremity.    Neck Pain      Prior to Admission medications   Medication Sig Start Date End Date Taking? Authorizing Provider  amiodarone  (PACERONE ) 200 MG tablet TAKE ONE TABLET BY MOUTH ONCE DAILY 06/18/24   Mallipeddi, Vishnu P, MD  amLODipine  (NORVASC ) 2.5 MG tablet Take 1 tablet (2.5 mg total) by mouth daily. 02/03/24   Mallipeddi, Vishnu P, MD  Ascorbic Acid  (VITAMIN C  CR) 1000 MG TBCR Take 2,000 mg by mouth daily.    [provider]  citalopram (CELEXA) 20 MG tablet Take 20 mg by mouth daily. 04/22/24   [provider]  ELIQUIS 5 MG TABS tablet Take 5 mg by mouth 2 (two) times daily. 01/15/24   [provider]  fluticasone (FLONASE) 50 MCG/ACT nasal spray Place 1 spray into both nostrils daily as needed for allergies or rhinitis.    [provider]  HYDROcodone -acetaminophen  (NORCO) 10-325 MG tablet Take 1 tablet by mouth 3 (three) times daily as needed for moderate pain (pain score 4-6) or severe pain (pain score 7-10). 02/03/20   [provider]  metoprolol  tartrate (LOPRESSOR ) 25 MG tablet Take 0.5 tablets (12.5 mg total) by mouth 2 (two) times daily. 03/02/24   Mallipeddi, Vishnu P, MD  Multiple Vitamin (MULTIVITAMIN WITH MINERALS) TABS  tablet Take 1 tablet by mouth daily.    [provider]  olmesartan (BENICAR) 40 MG tablet Take 40 mg by mouth daily.    [provider]  Omega-3 Fatty Acids (FISH OIL OMEGA-3) 1000 MG CAPS Take 3,000 mg by mouth daily.    [provider]  omeprazole (PRILOSEC) 20 MG capsule Take 20 mg by mouth daily. 09/12/17   [provider]  predniSONE  (DELTASONE ) 20 MG tablet 2 tabs po daily x 3 days 05/18/24   Zammit, Joseph, MD  Saw Palmetto 450 MG CAPS Take 450 mg by mouth 2 (two) times daily.    [provider]  tamsulosin  (FLOMAX ) 0.4 MG CAPS capsule Take 0.4 mg by mouth daily.    [provider]    Allergies: Alpha-gal and Poison ivy extract [poison ivy extract]    Review of Systems  Musculoskeletal:  Positive for neck pain.    Updated Vital Signs BP 133/89 (BP Location: Right Arm)   Pulse 78   Temp (!) 97.5 F (36.4 C) (Oral)   Resp 18   Ht 5' 11 (1.803 m)   Wt 108 kg   SpO2 95%   BMI 33.21 kg/m   Physical Exam Vitals and nursing note reviewed.  Constitutional:      General: He is not in acute distress.    Appearance: He is not toxic-appearing.  HENT:     Head: Normocephalic and atraumatic.  Eyes:  General: No scleral icterus.    Conjunctiva/sclera: Conjunctivae normal.  Cardiovascular:     Rate and Rhythm: Normal rate and regular rhythm.     Pulses: Normal pulses.     Heart sounds: Normal heart sounds.  Pulmonary:     Effort: Pulmonary effort is normal. No respiratory distress.     Breath sounds: Normal breath sounds.  Abdominal:     General: Abdomen is flat. Bowel sounds are normal.     Palpations: Abdomen is soft.     Tenderness: There is no abdominal tenderness.  Musculoskeletal:       Arms:     Comments: Upper extremity sensation is intact and equal bilaterally.  Upper extremity strength 5 out of 5 bilaterally.  Good grip strength.  Strong radial pulse equal bilaterally. Normal ROM of cervical spine without  cervical, thoracic or lumbar midline tenderness step-off or deformity.  Skin:    General: Skin is warm and dry.     Findings: No lesion.  Neurological:     General: No focal deficit present.     Mental Status: He is alert and oriented to person, place, and time. Mental status is at baseline.     (all labs ordered are listed, but only abnormal results are displayed) Labs Reviewed - No data to display  EKG: None  Radiology: No results found.   Procedures   Medications Ordered in the ED  lidocaine  (LIDODERM ) 5 % 1 patch (1 patch Transdermal Patch Applied 07/10/24 1555)  methocarbamol  (ROBAXIN ) tablet 1,000 mg (1,000 mg Oral Given 07/10/24 1552)  dexamethasone  (DECADRON ) injection 10 mg (10 mg Intramuscular Given 07/10/24 1555)                                    Medical Decision Making Risk Prescription drug management.   [name] 67 y.o. presented today for neck pain. Working Ddx: MSK in nature, fracture, epidural hematoma/abscess, cauda equina syndrome, spinal stenosis, spinal malignancy, discitis, spinal infection, spondylitises/ spondylosis, conus medullaris, DDD of the back, meningitis/encephalitis.   R/o DDx: Cauda equina syndrome and additional dx are less likely than current impression due to history of present illness, physical exam, labs/imaging findings. No focal neurological deficits, no loss of bowel or bladder control.  Denies fever, night sweats, weight loss, h/o cancer, IVDU.    Review of prior external notes: Prior ED visit 05/18/2024 in which patient had cervical spine x-ray showing degeneration as well as the diagnosis of persistent recall radiculopathy.  Problem List / ED Course / Critical interventions / Medication management  Patient reporting with left lateral neck pain.  Patient has nonfocal neurological exam.  He also has no cervical or thoracic midline tenderness step-off or deformity.  He does have reproducible tenderness over upper traps and left  lateral neck.  He has no meningeal signs.  He has normal range of motion of his neck.  He is Strong radial pulses equal bilaterally.  He has no rash or poor muscle tone on exam. I ordered medication including Decadron , lidocaine  patch, Robaxin . Reevaluation of the patient after these medicines showed that the patient improved Patients vitals assessed. Upon arrival patient is hemodynamically stable.  I have reviewed the patients home medicines and have made adjustments as needed. Patient was suggested to follow-up with neurosurgery but never called to schedule appointment.  Feel patient would benefit from follow-up with them as well as consideration of MRI. Patient reports severe  claustrophobia and admits it is unlikely he would be willing to have MRI.  At this time without any acute changes in symptoms do not feel that cervical spine imaging needs to be repeated today.  Will trial a point steroid taper and muscle relaxer.  He also follows with pain management.  Given return precautions.      Final diagnoses:  Cervical radiculopathy    ED Discharge Orders          Ordered    lidocaine  (LIDODERM ) 5 %  Every 24 hours,   Status:  Discontinued        07/10/24 1626    methocarbamol  (ROBAXIN ) 500 MG tablet  2 times daily,   Status:  Discontinued        07/10/24 1626    predniSONE  (DELTASONE ) 20 MG tablet  Daily,   Status:  Discontinued        07/10/24 1626    lidocaine  (LIDODERM ) 5 %  Every 24 hours        07/10/24 1651    methocarbamol  (ROBAXIN ) 500 MG tablet  2 times daily        07/10/24 1651    predniSONE  (DELTASONE ) 20 MG tablet  Daily        07/10/24 1651               Tia Hieronymus, Warren SAILOR, PA-C 07/10/24 1804    Simon Lavonia SAILOR, MD 07/11/24 [phone]

## 2024-07-10 NOTE — Discharge Instructions (Signed)
 I would recommend Tylenol  and anti-inflammatory.  You can also use lidocaine  patch and Robaxin .  Do not drive or operate heavy machinery with Robaxin .  Take your at home Norco for breakthrough pain.  I have sent 5 days of prednisone  please take as prescribed.  Call to schedule appointment with neurosurgery.  Return to ER with new or worsening symptoms.

## 2024-07-10 NOTE — ED Triage Notes (Signed)
 Pt c/o left side neck pain that radiates up his neck into the lower part of head and down to left shoulder. Pt states seen previous for same problems and was referred to a specialist who has not seen him yet. Pt states symptoms started around July 1.

## 2024-07-13 DIAGNOSIS — Z6835 Body mass index (BMI) 35.0-35.9, adult: Secondary | ICD-10-CM | POA: Diagnosis not present

## 2024-07-13 DIAGNOSIS — M542 Cervicalgia: Secondary | ICD-10-CM | POA: Diagnosis not present

## 2024-07-13 DIAGNOSIS — M25512 Pain in left shoulder: Secondary | ICD-10-CM | POA: Diagnosis not present

## 2024-07-14 ENCOUNTER — Encounter (HOSPITAL_COMMUNITY): Payer: Self-pay

## 2024-07-14 ENCOUNTER — Telehealth: Payer: Self-pay

## 2024-07-14 ENCOUNTER — Ambulatory Visit (HOSPITAL_COMMUNITY)
Admission: RE | Admit: 2024-07-14 | Discharge: 2024-07-14 | Disposition: A | Discharge status: Home / Self Care | Source: Ambulatory Visit | Attending: Cardiology | Admitting: Cardiology

## 2024-07-14 DIAGNOSIS — I4819 Other persistent atrial fibrillation: Secondary | ICD-10-CM

## 2024-07-14 MED ORDER — IOHEXOL 350 MG/ML SOLN: 80.0000 mL | Freq: Once | INTRAVENOUS | Status: DC | PRN

## 2024-07-14 NOTE — Telephone Encounter (Signed)
 patient came for pre ablation CT and was unable to complete. extremely claustrophobic.   Per Dr. Nancey....  Ok. Just reinforce not missing anticoagulation.   Called pt to inform him that he would not need to reschedule but had to leave a message. Left a detailed message of how important it is to NOT miss a dose of Eliquis and if he does he needs to call our office and let us  know.

## 2024-07-14 NOTE — Telephone Encounter (Signed)
 Pt would like a c/b in regards to blood work questions he would like to know if its urgent or can he wait until september 9th and he would like to know the best place to have it done I informed him of the options but would still like a c/b.

## 2024-07-15 NOTE — Telephone Encounter (Signed)
 Returned call to pt - he has an appt in [place] on 9/9 and wanted to know if he could wait and have his labs done then. I told him that was fine.   If it's pretty weather next week he may ride his bike and come next week but if not he will be here on 9/9.  I went over Ablation instructions with pt in detail.

## 2024-07-27 ENCOUNTER — Other Ambulatory Visit: Payer: Self-pay | Admitting: Internal Medicine

## 2024-07-27 DIAGNOSIS — I471 Supraventricular tachycardia, unspecified: Secondary | ICD-10-CM | POA: Diagnosis not present

## 2024-07-27 DIAGNOSIS — Z79899 Other long term (current) drug therapy: Secondary | ICD-10-CM | POA: Diagnosis not present

## 2024-07-27 DIAGNOSIS — I4819 Other persistent atrial fibrillation: Secondary | ICD-10-CM | POA: Diagnosis not present

## 2024-07-27 DIAGNOSIS — M4722 Other spondylosis with radiculopathy, cervical region: Secondary | ICD-10-CM | POA: Diagnosis not present

## 2024-07-27 DIAGNOSIS — M47816 Spondylosis without myelopathy or radiculopathy, lumbar region: Secondary | ICD-10-CM | POA: Diagnosis not present

## 2024-07-27 DIAGNOSIS — Z5181 Encounter for therapeutic drug level monitoring: Secondary | ICD-10-CM | POA: Diagnosis not present

## 2024-07-27 DIAGNOSIS — M542 Cervicalgia: Secondary | ICD-10-CM | POA: Diagnosis not present

## 2024-07-28 ENCOUNTER — Ambulatory Visit: Payer: Self-pay

## 2024-07-28 LAB — BASIC METABOLIC PANEL WITH GFR
BUN/Creatinine Ratio: 28 — ABNORMAL HIGH (ref 10–24)
BUN: 25 mg/dL (ref 8–27)
CO2: 23 mmol/L (ref 20–29)
Calcium: 9.4 mg/dL (ref 8.6–10.2)
Chloride: 99 mmol/L (ref 96–106)
Creatinine, Ser: 0.9 mg/dL (ref 0.76–1.27)
Glucose: 158 mg/dL — ABNORMAL HIGH (ref 70–99)
Potassium: 5 mmol/L (ref 3.5–5.2)
Sodium: 137 mmol/L (ref 134–144)
eGFR: 94 mL/min/1.73 (ref >59)

## 2024-07-28 LAB — CBC
Hematocrit: 42.2 % (ref 37.5–51.0)
Hemoglobin: 13.9 g/dL (ref 13.0–17.7)
MCH: 30.6 pg (ref 26.6–33.0)
MCHC: 32.9 g/dL (ref 31.5–35.7)
MCV: 93 fL (ref 79–97)
Platelets: 252 x10E3/uL (ref 150–450)
RBC: 4.54 x10E6/uL (ref 4.14–5.80)
RDW: 13.1 % (ref 11.6–15.4)
WBC: 13.8 x10E3/uL — ABNORMAL HIGH (ref 3.4–10.8)

## 2024-07-29 DIAGNOSIS — K219 Gastro-esophageal reflux disease without esophagitis: Secondary | ICD-10-CM | POA: Diagnosis not present

## 2024-07-29 DIAGNOSIS — I10 Essential (primary) hypertension: Secondary | ICD-10-CM | POA: Diagnosis not present

## 2024-07-29 DIAGNOSIS — M25512 Pain in left shoulder: Secondary | ICD-10-CM | POA: Diagnosis not present

## 2024-07-29 DIAGNOSIS — N400 Enlarged prostate without lower urinary tract symptoms: Secondary | ICD-10-CM | POA: Diagnosis not present

## 2024-08-02 ENCOUNTER — Encounter: Payer: Self-pay | Admitting: Nurse Practitioner

## 2024-08-02 ENCOUNTER — Ambulatory Visit: Attending: Nurse Practitioner | Admitting: Nurse Practitioner

## 2024-08-02 VITALS — BP 110/72 | HR 76 | Ht 71.0 in | Wt 242.2 lb

## 2024-08-02 DIAGNOSIS — I4819 Other persistent atrial fibrillation: Secondary | ICD-10-CM | POA: Diagnosis not present

## 2024-08-02 DIAGNOSIS — R0609 Other forms of dyspnea: Secondary | ICD-10-CM

## 2024-08-02 DIAGNOSIS — R5383 Other fatigue: Secondary | ICD-10-CM | POA: Diagnosis not present

## 2024-08-02 DIAGNOSIS — I77819 Aortic ectasia, unspecified site: Secondary | ICD-10-CM

## 2024-08-02 DIAGNOSIS — I10 Essential (primary) hypertension: Secondary | ICD-10-CM | POA: Diagnosis not present

## 2024-08-02 NOTE — Progress Notes (Unsigned)
 Cardiology Office Note:  .   Date:  08/02/2024 ID:  [name], DOB 1956/08/21, MRN [id number] PCP: Orpha Yancey LABOR, MD  [facility] Providers Cardiologist:  Stanly LABOR Leavens, MD    History of Present Illness: Melvin   ABDURRAHMAN Turner is a 68 y.o. male with a PMH of persistent A-fib, hypertension, SVT, frequent PVCs, shortness of breath, GERD, BPH, presents today for scheduled follow-up.  Last seen by Dr. Mallipeddi on February 03, 2024.  Was found to be in persistent A-fib with frequent PVCs.  This was started on Lopressor  25 mg twice daily.  It was reported he does have occasional shortness of breath palpitations and chronic fatigue.  Was scheduled for DCCV.  Echocardiogram was arranged and revealed normal LVEF, indeterminate diastolic function, dilatation of aortic root at 42 mm and ascending aorta at 41 mm.  Recommended to obtain CTA of chest/aorta in 1 year.  Underwent cardioversion on March 02, 2024.  And underwent repeat cardioversion on May 10, 2024 that initially did show normal sinus rhythm-wore monitor-see full report below.  07/27/2024 - Here for follow-up.  He is scheduled for A-fib ablation later this week.  Overall doing well and denies any acute cardiac plaints or concerns today. Denies any chest pain, palpitations, syncope, presyncope, dizziness, orthopnea, PND, swelling or significant weight changes, acute bleeding, or claudication.  Continues to note fatigue and DOE related to his A-fib.   ROS: Negative.  See HPI.  Studies Reviewed: Melvin    EKG: EKG Interpretation Date/Time:  Monday August 02 2024 08:21:31 EDT Ventricular Rate:  76 PR Interval:    QRS Duration:  92 QT Interval:  382 QTC Calculation: 429 R Axis:   81  Text Interpretation: Atrial fibrillation with a competing junctional pacemaker with premature ventricular or aberrantly conducted complexes Septal infarct (cited on or before 24-May-2024) When compared with ECG of 24-May-2024 12:24, No  significant change was found Confirmed by Miriam Norris [phone]) on 08/02/2024 8:31:29 AM   EKG Interpretation Date/Time:  Monday August 02 2024 08:21:31 EDT Ventricular Rate:  76 PR Interval:    QRS Duration:  92 QT Interval:  382 QTC Calculation: 429 R Axis:   81  Text Interpretation: Atrial fibrillation with a competing junctional pacemaker with premature ventricular or aberrantly conducted complexes Septal infarct (cited on or before 24-May-2024) When compared with ECG of 24-May-2024 12:24, No significant change was found Confirmed by Miriam Norris [phone]) on 08/02/2024 8:31:29 AM   Cardiac monitor 04/2024:    Patch wear time was for 3 days and 7 hours.   100% atrial fibrillation burden, ranging from 46 to 146 PM with an average HR 82 bpm.   No evidence of ventricular arrhythmias, AV block or pauses. <1% PVC burden.   Patient triggered events correlated with atrial fibrillation (69 to 98 bpm) and PVC.   Echo 02/2024:   1. Left ventricular ejection fraction, by estimation, is 55 to 60%. The  left ventricle has normal function. Left ventricular endocardial border  not optimally defined to evaluate regional wall motion. There is mild  concentric left ventricular  hypertrophy. Left ventricular diastolic parameters are indeterminate.   2. Right ventricular systolic function is normal. The right ventricular  size is normal. Tricuspid regurgitation signal is inadequate for assessing  PA pressure.   3. Right atrial size was mildly dilated.   4. The mitral valve is grossly normal. Trivial mitral valve  regurgitation.   5. The aortic valve is tricuspid. There is mild calcification  of the  aortic valve. Aortic valve regurgitation is not visualized. Aortic valve  sclerosis/calcification is present, without any evidence of aortic  stenosis. Aortic valve mean gradient  measures 6.0 mmHg.   6. Aortic dilatation noted. There is mild dilatation of the aortic root,  measuring 42 mm. There is  mild dilatation of the ascending aorta,  measuring 41 mm.   7. Unable to estimate CVP.   Comparison(s): A prior study was performed on 03/01/2022. Prior images  reviewed side by side. LVEF normal range at 55-60%. Aortic valve sclerosis  without stenosis. Mildly dilated aortic root and ascending aorta.  Physical Exam:   VS:  BP 110/72 (BP Location: Left Arm)   Pulse 76   Ht 5' 11 (1.803 m)   Wt 242 lb 3.2 oz (109.9 kg)   SpO2 93%   BMI 33.78 kg/m    Wt Readings from Last 3 Encounters:  08/04/24 242 lb (109.8 kg)  08/02/24 242 lb 3.2 oz (109.9 kg)  07/10/24 238 lb 1.6 oz (108 kg)    GEN: Obese, 68 y.o. male in no acute distress NECK: No JVD; No carotid bruits CARDIAC: S1/S2, irregularly irregular rhythm, no murmurs, rubs, gallops RESPIRATORY:  Clear to auscultation without rales, wheezing or rhonchi  ABDOMEN: Soft, non-tender, non-distended EXTREMITIES:  No edema; No deformity   ASSESSMENT AND PLAN: .    Persistent A-fib He continues to remain persistent A-fib.  History of unsuccessful cardioversions.  See most recent monitor report noted above.  Continue Lopressor . HR well controlled.  Continue Eliquis 5 mg twice daily for rate control.  He is pending ablation in the next few days.  Discussed avoiding triggers to A-fib.  Recommended Kardia mobile app.   HTN BP stable. Discussed to monitor BP at home at least 2 hours after medications and sitting for 5-10 minutes.  No medication changes at this time. Heart healthy diet and regular cardiovascular exercise encouraged.   Fatigue, DOE Etiology multifactorial.  Do believe this is partially related to his A-fib. Recent Echo overall unremarkable. Continue to f/u with PCP.  Aortic dilatation Most recent echo from April 2025 showed mild dilatation of aortic root measuring four 2 mm along with mild dilatation of ascending aorta, measuring 41 mm.  Plan to update echocardiogram in April 2026 for further evaluation. Care and ED precautions  discussed.   I spent a total duration of 30 minutes reviewing prior notes, reviewing outside records including  labs, EKG today, face-to-face counseling of  medical condition, pathophysiology, evaluation, management, and documenting the findings in the note.    Dispo: Follow-up with me/APP in 3 months or sooner if any changes.  Signed, Almarie Crate, NP

## 2024-08-02 NOTE — Patient Instructions (Addendum)

## 2024-08-03 NOTE — Anesthesia Preprocedure Evaluation (Signed)
 Anesthesia Evaluation  Patient identified by MRN, date of birth, ID band Patient awake    Reviewed: Allergy & Precautions, NPO status , Patient's Chart, lab work & pertinent test results, reviewed documented beta blocker date and time   History of Anesthesia Complications Negative for: history of anesthetic complications  Airway Mallampati: II  TM Distance: >3 FB Neck ROM: Full    Dental  (+) Dental Advisory Given   Pulmonary former smoker   Pulmonary exam normal        Cardiovascular hypertension, Pt. on home beta blockers and Pt. on medications Normal cardiovascular exam+ dysrhythmias Atrial Fibrillation    '25 TTE - EF 55 to 60%. There is mild concentric left ventricular hypertrophy. Right atrial size was mildly dilated. Trivial MR. There is mild dilatation of the aortic root, measuring 42 mm. There is mild dilatation of the ascending aorta, measuring 41 mm.      Neuro/Psych negative neurological ROS  negative psych ROS   GI/Hepatic ,GERD  Medicated and Controlled,,(+)     substance abuse  alcohol use  Endo/Other   Obesity   Renal/GU negative Renal ROS     Musculoskeletal  (+) Arthritis ,    Abdominal   Peds  Hematology  On eliquis    Anesthesia Other Findings   Reproductive/Obstetrics                              Anesthesia Physical Anesthesia Plan  ASA: 3  Anesthesia Plan: General   Post-op Pain Management: Tylenol  PO (pre-op)*   Induction: Intravenous  PONV Risk Score and Plan: 2 and Treatment may vary due to age or medical condition, Ondansetron , Dexamethasone  and Midazolam   Airway Management Planned: Oral ETT  Additional Equipment: None  Intra-op Plan:   Post-operative Plan: Extubation in OR  Informed Consent: I have reviewed the patients History and Physical, chart, labs and discussed the procedure including the risks, benefits and alternatives for the  proposed anesthesia with the patient or authorized representative who has indicated his/her understanding and acceptance.     Dental advisory given  Plan Discussed with: CRNA and Anesthesiologist  Anesthesia Plan Comments:          Anesthesia Quick Evaluation

## 2024-08-03 NOTE — Pre-Procedure Instructions (Signed)
 Attempted to call patient regarding procedure instructions.  Left voicemail on the following items: Arrival time 0615 Nothing to eat or drink after midnight No meds AM of procedure Responsible person to drive you home and stay with you for 24 hrs  Have you missed any doses of anti-coagulant Eliquis - should be taken twice a day, if you have missed any doses please let us  know.  Don't take dose morning of procedure.

## 2024-08-04 ENCOUNTER — Ambulatory Visit (HOSPITAL_COMMUNITY)
Admission: RE | Disposition: A | Discharge status: Home / Self Care | Payer: Self-pay | Source: Home / Self Care | Attending: Cardiovascular Disease

## 2024-08-04 ENCOUNTER — Ambulatory Visit (HOSPITAL_COMMUNITY)
Admission: RE | Admit: 2024-08-04 | Discharge: 2024-08-04 | Disposition: A | Discharge status: Home / Self Care | Attending: Cardiovascular Disease | Admitting: Cardiovascular Disease

## 2024-08-04 ENCOUNTER — Encounter (HOSPITAL_COMMUNITY): Payer: Self-pay | Admitting: Cardiovascular Disease

## 2024-08-04 ENCOUNTER — Ambulatory Visit (HOSPITAL_BASED_OUTPATIENT_CLINIC_OR_DEPARTMENT_OTHER): Payer: Self-pay | Admitting: Certified Registered Nurse Anesthetist

## 2024-08-04 ENCOUNTER — Other Ambulatory Visit: Payer: Self-pay

## 2024-08-04 ENCOUNTER — Ambulatory Visit (HOSPITAL_COMMUNITY): Payer: Self-pay | Admitting: Certified Registered Nurse Anesthetist

## 2024-08-04 DIAGNOSIS — I471 Supraventricular tachycardia, unspecified: Secondary | ICD-10-CM | POA: Diagnosis not present

## 2024-08-04 DIAGNOSIS — Z7901 Long term (current) use of anticoagulants: Secondary | ICD-10-CM | POA: Insufficient documentation

## 2024-08-04 DIAGNOSIS — Z79899 Other long term (current) drug therapy: Secondary | ICD-10-CM | POA: Insufficient documentation

## 2024-08-04 DIAGNOSIS — I4891 Unspecified atrial fibrillation: Secondary | ICD-10-CM | POA: Diagnosis not present

## 2024-08-04 DIAGNOSIS — Z87891 Personal history of nicotine dependence: Secondary | ICD-10-CM

## 2024-08-04 DIAGNOSIS — I10 Essential (primary) hypertension: Secondary | ICD-10-CM | POA: Insufficient documentation

## 2024-08-04 DIAGNOSIS — D6869 Other thrombophilia: Secondary | ICD-10-CM | POA: Insufficient documentation

## 2024-08-04 DIAGNOSIS — I4819 Other persistent atrial fibrillation: Secondary | ICD-10-CM | POA: Diagnosis not present

## 2024-08-04 DIAGNOSIS — G4730 Sleep apnea, unspecified: Secondary | ICD-10-CM | POA: Diagnosis not present

## 2024-08-04 DIAGNOSIS — I4892 Unspecified atrial flutter: Secondary | ICD-10-CM | POA: Diagnosis not present

## 2024-08-04 DIAGNOSIS — K219 Gastro-esophageal reflux disease without esophagitis: Secondary | ICD-10-CM | POA: Diagnosis not present

## 2024-08-04 DIAGNOSIS — I483 Typical atrial flutter: Secondary | ICD-10-CM | POA: Diagnosis not present

## 2024-08-04 HISTORY — PX: ATRIAL FIBRILLATION ABLATION: EP1191

## 2024-08-04 LAB — POCT ACTIVATED CLOTTING TIME
Activated Clotting Time: 348 s
Activated Clotting Time: 337 s
Activated Clotting Time: 262 s

## 2024-08-04 SURGERY — ATRIAL FIBRILLATION ABLATION
Anesthesia: General

## 2024-08-04 MED ORDER — LIDOCAINE 2% (20 MG/ML) 5 ML SYRINGE
INTRAMUSCULAR | Status: DC | PRN
  Administered 2024-08-04: 80 mg via INTRAVENOUS

## 2024-08-04 MED ORDER — FENTANYL CITRATE (PF) 100 MCG/2ML IJ SOLN
INTRAMUSCULAR | Status: DC | PRN
  Administered 2024-08-04: 100 ug via INTRAVENOUS

## 2024-08-04 MED ORDER — PHENYLEPHRINE HCL-NACL 20-0.9 MG/250ML-% IV SOLN
INTRAVENOUS | Status: DC | PRN
  Administered 2024-08-04: 30 ug/min via INTRAVENOUS

## 2024-08-04 MED ORDER — MIDAZOLAM HCL 2 MG/2ML IJ SOLN
INTRAMUSCULAR | Status: AC
  Filled 2024-08-04: qty 2

## 2024-08-04 MED ORDER — FENTANYL CITRATE (PF) 100 MCG/2ML IJ SOLN
INTRAMUSCULAR | Status: AC
  Filled 2024-08-04: qty 2

## 2024-08-04 MED ORDER — SODIUM CHLORIDE 0.9 % IV SOLN: INTRAVENOUS | Status: DC

## 2024-08-04 MED ORDER — ATROPINE SULFATE 1 MG/10ML IJ SOSY
PREFILLED_SYRINGE | INTRAMUSCULAR | Status: AC
  Filled 2024-08-04: qty 10

## 2024-08-04 MED ORDER — ACETAMINOPHEN 500 MG PO TABS
1000.0000 mg | ORAL_TABLET | Freq: Once | ORAL | Status: AC
  Administered 2024-08-04: 1000 mg via ORAL
  Filled 2024-08-04: qty 2

## 2024-08-04 MED ORDER — SODIUM CHLORIDE 0.9 % IV SOLN
Freq: Once | INTRAVENOUS | Status: DC
  Filled 2024-08-04: qty 10

## 2024-08-04 MED ORDER — LACTATED RINGERS IV SOLN: INTRAVENOUS | Status: DC | PRN

## 2024-08-04 MED ORDER — BIVALIRUDIN BOLUS VIA INFUSION - CUPID
INTRAVENOUS | Status: DC | PRN
Start: 2024-08-04 — End: 2024-08-04
  Administered 2024-08-04: 82.4 mg via INTRAVENOUS
  Administered 2024-08-04: 1.75 mg/kg/h via INTRAVENOUS

## 2024-08-04 MED ORDER — DEXAMETHASONE SODIUM PHOSPHATE 10 MG/ML IJ SOLN
INTRAMUSCULAR | Status: DC | PRN
  Administered 2024-08-04: 5 mg via INTRAVENOUS

## 2024-08-04 MED ORDER — BIVALIRUDIN TRIFLUOROACETATE 250 MG IV SOLR
INTRAVENOUS | Status: AC
  Filled 2024-08-04: qty 250

## 2024-08-04 MED ORDER — SODIUM CHLORIDE 0.9 % IV SOLN
Freq: Once | INTRAVENOUS | Status: DC
Start: 2024-08-04 — End: 2024-08-04
  Filled 2024-08-04: qty 10

## 2024-08-04 MED ORDER — ACETAMINOPHEN 325 MG PO TABS
650.0000 mg | ORAL_TABLET | ORAL | Status: DC | PRN
  Administered 2024-08-04: 650 mg via ORAL
  Filled 2024-08-04: qty 2

## 2024-08-04 MED ORDER — ONDANSETRON HCL 4 MG/2ML IJ SOLN
INTRAMUSCULAR | Status: DC | PRN
  Administered 2024-08-04: 4 mg via INTRAVENOUS

## 2024-08-04 MED ORDER — MIDAZOLAM HCL 2 MG/2ML IJ SOLN
INTRAMUSCULAR | Status: DC | PRN
  Administered 2024-08-04: 2 mg via INTRAVENOUS

## 2024-08-04 MED ORDER — PHENYLEPHRINE 80 MCG/ML (10ML) SYRINGE FOR IV PUSH (FOR BLOOD PRESSURE SUPPORT)
PREFILLED_SYRINGE | INTRAVENOUS | Status: DC | PRN
  Administered 2024-08-04 (×2): 100 ug via INTRAVENOUS
  Administered 2024-08-04: 160 ug via INTRAVENOUS
  Administered 2024-08-04: 240 ug via INTRAVENOUS
  Administered 2024-08-04: 200 ug via INTRAVENOUS

## 2024-08-04 MED ORDER — SUGAMMADEX SODIUM 200 MG/2ML IV SOLN
INTRAVENOUS | Status: DC | PRN
  Administered 2024-08-04: 225 mg via INTRAVENOUS

## 2024-08-04 MED ORDER — ONDANSETRON HCL 4 MG/2ML IJ SOLN: 4.0000 mg | Freq: Four times a day (QID) | INTRAMUSCULAR | Status: DC | PRN

## 2024-08-04 MED ORDER — PROPOFOL 10 MG/ML IV BOLUS
INTRAVENOUS | Status: DC | PRN
Start: 2024-08-04 — End: 2024-08-04
  Administered 2024-08-04: 20 mg via INTRAVENOUS
  Administered 2024-08-04: 140 mg via INTRAVENOUS

## 2024-08-04 MED ORDER — NITROGLYCERIN 1 MG/10 ML FOR IR/CATH LAB
INTRA_ARTERIAL | Status: DC | PRN
  Administered 2024-08-04: 20 mL via INTRAVENOUS

## 2024-08-04 MED ORDER — ROCURONIUM BROMIDE 10 MG/ML (PF) SYRINGE
PREFILLED_SYRINGE | INTRAVENOUS | Status: DC | PRN
  Administered 2024-08-04: 10 mg via INTRAVENOUS
  Administered 2024-08-04: 70 mg via INTRAVENOUS
  Administered 2024-08-04: 10 mg via INTRAVENOUS
  Administered 2024-08-04: 20 mg via INTRAVENOUS

## 2024-08-04 MED ORDER — NITROGLYCERIN 1 MG/10 ML FOR IR/CATH LAB
INTRA_ARTERIAL | Status: AC
  Filled 2024-08-04: qty 20

## 2024-08-04 MED ORDER — ATROPINE SULFATE 1 MG/10ML IJ SOSY
PREFILLED_SYRINGE | INTRAMUSCULAR | Status: DC | PRN
Start: 2024-08-04 — End: 2024-08-04
  Administered 2024-08-04: 1 mg via INTRAVENOUS

## 2024-08-04 SURGICAL SUPPLY — 20 items
BAG SNAP BAND KOVER 36X36 (MISCELLANEOUS) IMPLANT
BLANKET WARM UNDERBOD FULL ACC (MISCELLANEOUS) ×1 IMPLANT
CABLE FARASTAR GEN2 SNGL USE (CABLE) IMPLANT
CATH FARAWAVE 2.0 31 (CATHETERS) IMPLANT
CATH GE 8FR SOUNDSTAR (CATHETERS) IMPLANT
CATH OCTARAY 2.0 F 3-3-3-3-3 (CATHETERS) IMPLANT
CATH WEBSTER BI DIR CS D-F CRV (CATHETERS) IMPLANT
CLOSURE PERCLOSE PROSTYLE (VASCULAR PRODUCTS) IMPLANT
COVER SWIFTLINK CONNECTOR (BAG) ×1 IMPLANT
DEVICE CLOSURE MYNXGRIP 6/7F (Vascular Products) IMPLANT
DILATOR VESSEL 38 20CM 16FR (INTRODUCER) IMPLANT
GUIDEWIRE INQWIRE 1.5J.035X260 (WIRE) IMPLANT
KIT VERSACROSS CNCT FARADRIVE (KITS) IMPLANT
PACK EP LF (CUSTOM PROCEDURE TRAY) ×1 IMPLANT
PAD DEFIB RADIO PHYSIO CONN (PAD) ×1 IMPLANT
PATCH CARTO3 (PAD) IMPLANT
SHEATH FARADRIVE STEERABLE (SHEATH) IMPLANT
SHEATH PINNACLE 8F 10CM (SHEATH) IMPLANT
SHEATH PINNACLE 9F 10CM (SHEATH) IMPLANT
SHEATH PROBE COVER 6X72 (BAG) IMPLANT

## 2024-08-04 NOTE — Anesthesia Postprocedure Evaluation (Signed)
 Anesthesia Post Note  Patient: Melvin Turner  Procedure(s) Performed: ATRIAL FIBRILLATION ABLATION     Patient location during evaluation: PACU Anesthesia Type: General Level of consciousness: awake and alert Pain management: pain level controlled Vital Signs Assessment: post-procedure vital signs reviewed and stable Respiratory status: spontaneous breathing, nonlabored ventilation and respiratory function stable Cardiovascular status: stable and blood pressure returned to baseline Anesthetic complications: no   There were no known notable events for this encounter.  Last Vitals:  Vitals:   08/04/24 1146 08/04/24 1200  BP: (!) 149/82 120/78  Pulse: 74 64  Resp: 15 17  Temp:    SpO2: 90% (!) 89%    Last Pain:  Vitals:   08/04/24 1145  TempSrc:   PainSc: 0-No pain   Pain Goal: Patients Stated Pain Goal: 4 (08/04/24 1145)                 Debby FORBES Like

## 2024-08-04 NOTE — Transfer of Care (Signed)
 Immediate Anesthesia Transfer of Care Note  Patient: Melvin Turner  Procedure(s) Performed: ATRIAL FIBRILLATION ABLATION  Patient Location: Cath Lab  Anesthesia Type:General  Level of Consciousness: awake, alert , and oriented  Airway & Oxygen Therapy: Patient Spontanous Breathing and Patient connected to nasal cannula oxygen  Post-op Assessment: Report given to RN, Post -op Vital signs reviewed and stable, and Patient moving all extremities X 4  Post vital signs: Reviewed and stable, see post op VS flowsheet  Last Vitals:  Vitals Value Taken Time  BP    Temp    Pulse 76 08/04/24 10:59  Resp 20 08/04/24 10:59  SpO2 92 % 08/04/24 10:59  Vitals shown include unfiled device data.  Last Pain:  Vitals:   08/04/24 0659  TempSrc:   PainSc: 0-No pain         Complications: There were no known notable events for this encounter.

## 2024-08-04 NOTE — H&P (Signed)
 Electrophysiology Office Note:    Date:  08/04/2024   ID:  Melvin Turner, DOB January 23, 1956, MRN [id number]  PCP:  Orpha Yancey LABOR, MD   [facility] Providers Cardiologist:  Stanly LABOR Leavens, MD     Referring MD: No ref. provider found   History of Present Illness:    Melvin Turner is a 68 y.o. male with a medical history significant for atrial fibrillation and flutter, sleep apnea, hypertension, referred for arrhythmia management.      Discussed the use of AI scribe software for clinical note transcription with the patient, who gave verbal consent to proceed.  History of Present Illness The patient is a 68 year old with atrial fibrillation and atrial flutter who presents for arrhythmia management.  He was initially diagnosed with atrial fibrillation in March 2025 and underwent DC cardioversion in April 2025. He was started on amiodarone , but experienced a recurrence of atrial fibrillation by May 2025 and has persistently been in atrial fibrillation since then. He notes occasional palpitations but generally does not notice significant symptoms. There has been no change in symptoms since starting amiodarone .  He has a history of atrial flutter, initially identified as SVT, diagnosed in March 2023. During a visit to his pain doctor, his pulse was noted to be elevated, leading to further evaluation and diagnosis. He recalls being taken to the hospital due to concerns about his heart rhythm at that time.  He consumes three to five beers daily and rides a motorcycle, specifically a [employer]. He mentions not feeling as energetic as he did in his fifties, attributing this to aging.  He is currently on Eliquis to prevent stroke due to his atrial fibrillation. A transthoracic echocardiogram (TTE) in April 2025 showed LVEF 55-60%, mild LVH, mild aortic dilation, mildly dilated right atrium, and normal-sized left atrium. In May 2025, he had a 100% atrial fibrillation burden with  heart rates ranging from 46 to 146 beats per minute, averaging 82 beats per minute.         Today, he reports he is at baseline. He notes that he has eaten ham and sausage but has never had any reaction to it.  EKGs/Labs/Other Studies Reviewed Today:     Echocardiogram:  TTE April 2025 LVEF 55 to 60%.  Mild concentric LVH.  Right atrium mildly dilated.  Left atrium is normal in size.  Mild aortic dilation.   Monitors:  4 day monitor May 2025-- my interpretation Atrial fibrillation 100% burden Heart rate 46 to 146 bpm, average 82 bpm    EKG:         Physical Exam:    VS:  BP 138/89   Pulse (!) 118   Temp 97.7 F (36.5 C) (Oral)   Resp 20   Ht 5' 11 (1.803 m)   Wt 109.8 kg   SpO2 94%   BMI 33.75 kg/m     Wt Readings from Last 3 Encounters:  08/04/24 109.8 kg  08/02/24 109.9 kg  07/10/24 108 kg     GEN: Well nourished, well developed in no acute distress CARDIAC: iRRR, no murmurs, rubs, gallops RESPIRATORY:  Normal work of breathing MUSCULOSKELETAL: no edema    ASSESSMENT & PLAN:     Persistent atrial fibrillation Rapid return of atrial fibrillation after cardioversion Loaded with amiodarone  and again had recurrence of atrial fibrillation despite amiodarone  and repeat DCCV Having mild fatigue with AF We discussed management options, and using a shared decision making approach, opted to proceed  with ablation.  We discussed the indication, rationale, logistics, anticipated benefits, and potential risks of the ablation procedure including but not limited to -- bleed at the groin access site, chest pain, damage to nearby organs such as the diaphragm, lungs, or esophagus, need for a drainage tube, or prolonged hospitalization. I explained that the risk for stroke, heart attack, need for open chest surgery, or even death is very low but not zero. he  expressed understanding and wishes to proceed.   Secondary hypercoagulable state CHA2DS2-VASc score is  2 Continue apixaban 5 mg twice daily  Atrial flutter On EKG March 2023 Erroneously diagnosis of SVT in the past Will plan to perform CTI line at the time of his ablation procedure.   Signed, Eulas FORBES [place]urb[place], MD  08/04/2024 7:13 AM    [facility]

## 2024-08-04 NOTE — Progress Notes (Signed)
 Patient arrived to cath lab holding bay 20. Alert/ Oriented x4. Patient agitated because he wants to sit up and move. Placed in reverse trendelenburg to relieve some discomfort from laying flat. Bilateral groin dressings are dry/intact with no bleeding or hematoma present. Post activity and precautions re-enforced. Patient follows commands and answers questions appropriately. Will continue to monitor per orders/protocol.Blaise Palladino E

## 2024-08-04 NOTE — Anesthesia Procedure Notes (Addendum)
 Procedure Name: Intubation Date/Time: 08/04/2024 8:41 AM  Performed by: Claudene Arlin LABOR, CRNAPre-anesthesia Checklist: Patient identified, Emergency Drugs available, Suction available and Patient being monitored Patient Re-evaluated:Patient Re-evaluated prior to induction Oxygen Delivery Method: Circle System Utilized Preoxygenation: Pre-oxygenation with 100% oxygen Induction Type: IV induction Ventilation: Oral airway inserted - appropriate to patient size and Mask ventilation without difficulty Laryngoscope Size: Mac and 4 Grade View: Grade III Tube type: Oral Number of attempts: 1 Airway Equipment and Method: Stylet and Oral airway Placement Confirmation: ETT inserted through vocal cords under direct vision, positive ETCO2 and breath sounds checked- equal and bilateral Secured at: 23 cm Tube secured with: Tape Dental Injury: Teeth and Oropharynx as per pre-operative assessment  Comments: Intubated by NORVA Speed under MD/DO and CRNA supervision.

## 2024-08-04 NOTE — Progress Notes (Signed)
 Patient ambulated in hall to [facility]. Bilateral groin remain unremarkable.

## 2024-08-04 NOTE — Discharge Instructions (Signed)

## 2024-08-05 ENCOUNTER — Telehealth (HOSPITAL_COMMUNITY): Payer: Self-pay

## 2024-08-05 ENCOUNTER — Encounter (HOSPITAL_COMMUNITY): Payer: Self-pay | Admitting: Cardiovascular Disease

## 2024-08-05 MED FILL — Bivalirudin Trifluoroacetate For IV Soln 250 MG (Base Equiv): INTRAVENOUS | Qty: 361 | Status: AC

## 2024-08-05 NOTE — Telephone Encounter (Signed)
 Spoke with patient to complete post procedure follow up call.  Patient reports no complications with groin sites.   Instructions reviewed with patient:  Remove large bandage at puncture site after 24 hours. It is normal to have bruising, tenderness, mild swelling, and a pea or marble sized lump/knot at the groin site which can take up to three months to resolve.  Get help right away if you notice sudden swelling at the puncture site.  Check your puncture site every day for signs of infection: fever, redness, swelling, pus drainage, warmth, foul odor or excessive pain. If this occurs, please call [phone], to speak with the RN Navigator. Get help right away if your puncture site is bleeding and the bleeding does not stop after applying firm pressure to the area.  You may continue to have skipped beats/ atrial fibrillation during the first several months after your procedure.  It is very important not to miss any doses of your blood thinner Eliquis.    You will follow up with the Afib clinic on 09/01/24 and follow up with  Dr.Augustus Mealor on 10/22/24.     Patient verbalized understanding to all instructions provided.

## 2024-08-05 NOTE — Telephone Encounter (Signed)
 Attempted to reach patient to follow up with procedure completed on 08/04/24, no answer. Left VM for patient to return call.

## 2024-08-08 ENCOUNTER — Emergency Department (HOSPITAL_COMMUNITY)

## 2024-08-08 ENCOUNTER — Emergency Department (HOSPITAL_COMMUNITY)
Admission: EM | Admit: 2024-08-08 | Discharge: 2024-08-09 | Disposition: A | Discharge status: Home / Self Care | Attending: Emergency Medicine | Admitting: Emergency Medicine

## 2024-08-08 ENCOUNTER — Other Ambulatory Visit: Payer: Self-pay

## 2024-08-08 DIAGNOSIS — I4891 Unspecified atrial fibrillation: Secondary | ICD-10-CM | POA: Diagnosis not present

## 2024-08-08 DIAGNOSIS — R0602 Shortness of breath: Secondary | ICD-10-CM | POA: Diagnosis not present

## 2024-08-08 DIAGNOSIS — I517 Cardiomegaly: Secondary | ICD-10-CM | POA: Diagnosis not present

## 2024-08-08 DIAGNOSIS — Z79899 Other long term (current) drug therapy: Secondary | ICD-10-CM | POA: Diagnosis not present

## 2024-08-08 DIAGNOSIS — R600 Localized edema: Secondary | ICD-10-CM | POA: Insufficient documentation

## 2024-08-08 DIAGNOSIS — F410 Panic disorder [episodic paroxysmal anxiety] without agoraphobia: Secondary | ICD-10-CM | POA: Insufficient documentation

## 2024-08-08 DIAGNOSIS — R7989 Other specified abnormal findings of blood chemistry: Secondary | ICD-10-CM | POA: Insufficient documentation

## 2024-08-08 DIAGNOSIS — I10 Essential (primary) hypertension: Secondary | ICD-10-CM | POA: Diagnosis not present

## 2024-08-08 DIAGNOSIS — Z7901 Long term (current) use of anticoagulants: Secondary | ICD-10-CM | POA: Insufficient documentation

## 2024-08-08 DIAGNOSIS — R739 Hyperglycemia, unspecified: Secondary | ICD-10-CM

## 2024-08-08 MED ORDER — ASPIRIN 81 MG PO CHEW
324.0000 mg | CHEWABLE_TABLET | Freq: Once | ORAL | Status: AC
  Administered 2024-08-09: 324 mg via ORAL
  Filled 2024-08-08: qty 4

## 2024-08-08 MED ORDER — LORAZEPAM 1 MG PO TABS
1.0000 mg | ORAL_TABLET | Freq: Once | ORAL | Status: AC
  Administered 2024-08-09: 1 mg via ORAL
  Filled 2024-08-08: qty 1

## 2024-08-08 NOTE — ED Provider Notes (Signed)
 [place] EMERGENCY DEPARTMENT AT [facility] Provider Note   CSN: [id number] Arrival date & time: 08/08/24  2210     Patient presents with: Panic Attack and Chest Pain   Melvin Turner is a 68 y.o. male.  {Add pertinent medical, surgical, social history, OB history to YEP:67052} The history is provided by the patient.  Chest Pain  He has history of hypertension, atrial fibrillation anticoagulated on apixaban and comes in for what he feels is an anxiety attack.  He had onset about 2 hours ago of feeling anxious but also having some shortness of breath and pain across his lower chest.  There is some associated dyspnea but no nausea or vomiting and no diaphoresis.  Chest pain comes and goes with no particular pattern and he describes it as sharp.  It is not pleuritic.  He also states that he feels like he is having difficulty swallowing.  Of note, he had cardiac ablation done 4 days ago.    Prior to Admission medications   Medication Sig Start Date End Date Taking? Authorizing Provider  amLODipine  (NORVASC ) 2.5 MG tablet Take 1 tablet by mouth once daily 07/27/24   Mallipeddi, Vishnu P, MD  Ascorbic Acid  (VITAMIN C  CR) 1000 MG TBCR Take 2,000 mg by mouth daily.    [provider]  citalopram (CELEXA) 20 MG tablet Take 20 mg by mouth daily. 04/22/24   [provider]  ELIQUIS 5 MG TABS tablet Take 5 mg by mouth 2 (two) times daily. 01/15/24   [provider]  fluticasone (FLONASE) 50 MCG/ACT nasal spray Place 1 spray into both nostrils daily as needed for allergies or rhinitis.    [provider]  HYDROcodone -acetaminophen  (NORCO) 10-325 MG tablet Take 1 tablet by mouth 3 (three) times daily as needed for moderate pain (pain score 4-6) or severe pain (pain score 7-10). 02/03/20   [provider]  lidocaine  (LIDODERM ) 5 % Place 1 patch onto the skin daily. Remove & Discard patch within 12 hours or as directed by MD 07/10/24   Barrett, Jamie N,  PA-C  methocarbamol  (ROBAXIN ) 500 MG tablet Take 1 tablet (500 mg total) by mouth 2 (two) times daily. 07/10/24   Barrett, Warren SAILOR, PA-C  metoprolol  tartrate (LOPRESSOR ) 25 MG tablet Take 0.5 tablets (12.5 mg total) by mouth 2 (two) times daily. 03/02/24   Mallipeddi, Vishnu P, MD  Multiple Vitamin (MULTIVITAMIN WITH MINERALS) TABS tablet Take 1 tablet by mouth daily.    [provider]  olmesartan (BENICAR) 40 MG tablet Take 40 mg by mouth daily.    [provider]  Omega-3 Fatty Acids (FISH OIL OMEGA-3) 1000 MG CAPS Take 3,000 mg by mouth daily.    [provider]  omeprazole (PRILOSEC) 20 MG capsule Take 20 mg by mouth daily. 09/12/17   [provider]  Saw Palmetto 450 MG CAPS Take 450 mg by mouth 2 (two) times daily.    [provider]  tamsulosin  (FLOMAX ) 0.4 MG CAPS capsule Take 0.4 mg by mouth daily.    [provider]    Allergies: Alpha-gal and Poison ivy extract [poison ivy extract]    Review of Systems  Cardiovascular:  Positive for chest pain.  All other systems reviewed and are negative.   Updated Vital Signs BP 134/65   Pulse 62   Resp 18   Ht 5' 11 (1.803 m)   Wt 109.8 kg   SpO2 95%   BMI 33.75 kg/m  Physical Exam Vitals and nursing note reviewed.   68 year old male, resting comfortably and in no acute distress. Vital signs are normal. Oxygen saturation is 95%, which is normal. Head is normocephalic and atraumatic. PERRLA, EOMI. Oropharynx is clear. Neck is nontender and supple without adenopathy. Lungs are clear without rales, wheezes, or rhonchi. Chest is nontender. Heart has regular rate and rhythm without murmur. Abdomen is soft, flat, nontende. Extremities have 2+ edema, full range of motion is present. Skin is warm and dry without rash. Neurologic: Mental status is normal, moves all extremities equally.  (all labs ordered are listed, but only abnormal results are displayed) Labs Reviewed - No data  to display  EKG: EKG Interpretation Date/Time:  Sunday August 08 2024 22:29:03 EDT Ventricular Rate:  63 PR Interval:  202 QRS Duration:  92 QT Interval:  420 QTC Calculation: 429 R Axis:   72  Text Interpretation: Sinus rhythm with Premature supraventricular complexes Septal infarct (cited on or before 24-May-2024) Abnormal ECG When compared with ECG of 04-Aug-2024 11:04, Premature supraventricular complexes are now Present Questionable change in initial forces of Anterior leads Confirmed by Cleotilde Rogue ([id number]) on 08/08/2024 10:32:42 PM  Radiology: No results found.  {Document cardiac monitor, telemetry assessment procedure when appropriate:32947} Procedures   Medications Ordered in the ED - No data to display    {Click here for ABCD2, HEART and other calculators REFRESH Note before signing:1}                              Medical Decision Making Amount and/or Complexity of Data Reviewed Labs: ordered. Radiology: ordered.  Risk OTC drugs. Prescription drug management.   Chest pain with dyspnea and sense of anxiety.  This a presentation with a wide range of treatment options and carries with it a high risk of morbidity and complications.  Differential diagnosis includes, but is not limited to, anxiety attack, CHF exacerbation, ACS, pulmonary embolism.  Pulmonary embolism is unlikely in the setting of chronic anticoagulation.  I have reviewed his electrocardiogram, and my interpretation is sinus rhythm with PACs but no acute ST or T changes.  I have ordered cardiac workup including troponin x 2 and BNP.  I have ordered an initial dose of aspirin  and lorazepam .  I have reviewed his past records, and note cardiac ablation for atrial fibrillation on 08/04/2024.  Echocardiogram on 02/18/2024 showed ejection fraction 55-60% with indeterminant left ventricular diastolic parameters.  {Document critical care time when appropriate  Document review of labs and clinical decision tools ie  CHADS2VASC2, etc  Document your independent review of radiology images and any outside records  Document your discussion with family members, caretakers and with consultants  Document social determinants of health affecting pt's care  Document your decision making why or why not admission, treatments were needed:32947:::1}   Final diagnoses:  None    ED Discharge Orders     None

## 2024-08-08 NOTE — ED Triage Notes (Signed)
 Pt from home complains of difficulty swallowing, intermittent CP and upper abdominal pressure, mild SOB and panic attacks.  Pt alert and oriented, and ambulatory.

## 2024-08-09 DIAGNOSIS — F410 Panic disorder [episodic paroxysmal anxiety] without agoraphobia: Secondary | ICD-10-CM | POA: Diagnosis not present

## 2024-08-09 LAB — CBC WITH DIFFERENTIAL/PLATELET
Abs Immature Granulocytes: 0.03 K/uL (ref 0.00–0.07)
Basophils Absolute: 0.1 K/uL (ref 0.0–0.1)
Basophils Relative: 1 %
Eosinophils Absolute: 0.2 K/uL (ref 0.0–0.5)
Eosinophils Relative: 2 %
HCT: 40.6 % (ref 39.0–52.0)
Hemoglobin: 13.3 g/dL (ref 13.0–17.0)
Immature Granulocytes: 0 %
Lymphocytes Relative: 62 %
Lymphs Abs: 6.4 K/uL — ABNORMAL HIGH (ref 0.7–4.0)
MCH: 31.7 pg (ref 26.0–34.0)
MCHC: 32.8 g/dL (ref 30.0–36.0)
MCV: 96.7 fL (ref 80.0–100.0)
Monocytes Absolute: 0.5 K/uL (ref 0.1–1.0)
Monocytes Relative: 5 %
Neutro Abs: 3.1 K/uL (ref 1.7–7.7)
Neutrophils Relative %: 30 %
Platelets: 229 K/uL (ref 150–400)
RBC: 4.2 MIL/uL — ABNORMAL LOW (ref 4.22–5.81)
RDW: 13.9 % (ref 11.5–15.5)
WBC: 10.2 K/uL (ref 4.0–10.5)
nRBC: 0 % (ref 0.0–0.2)

## 2024-08-09 LAB — BASIC METABOLIC PANEL WITH GFR
Anion gap: 13 (ref 5–15)
BUN: 16 mg/dL (ref 8–23)
CO2: 25 mmol/L (ref 22–32)
Calcium: 9.2 mg/dL (ref 8.9–10.3)
Chloride: 103 mmol/L (ref 98–111)
Creatinine, Ser: 0.98 mg/dL (ref 0.61–1.24)
GFR, Estimated: >60 mL/min (ref >60)
Glucose, Bld: 129 mg/dL — ABNORMAL HIGH (ref 70–99)
Potassium: 4.4 mmol/L (ref 3.5–5.1)
Sodium: 141 mmol/L (ref 135–145)

## 2024-08-09 LAB — TROPONIN I (HIGH SENSITIVITY)
Troponin I (High Sensitivity): 151 ng/L (ref <18)
Troponin I (High Sensitivity): 166 ng/L (ref <18)

## 2024-08-09 LAB — BRAIN NATRIURETIC PEPTIDE: B Natriuretic Peptide: 109 pg/mL — ABNORMAL HIGH (ref 0.0–100.0)

## 2024-08-09 MED ORDER — LORAZEPAM 1 MG PO TABS: 1.0000 mg | ORAL_TABLET | Freq: Three times a day (TID) | ORAL | 0 refills | Status: AC | PRN

## 2024-08-09 MED ORDER — FUROSEMIDE 40 MG PO TABS: 40.0000 mg | ORAL_TABLET | Freq: Every day | ORAL | 0 refills | Status: AC

## 2024-08-09 NOTE — ED Notes (Signed)
 New visitor bedside speaking to pt. He reports that he feels much better at this time.

## 2024-08-09 NOTE — ED Notes (Signed)
 Provider bedside updating family.  Pt given water and crackers per provider.

## 2024-08-09 NOTE — Discharge Instructions (Addendum)
 He may take lorazepam  as needed when you have an anxiety attack.  However, if you have any concerns whatsoever about your heart, you are welcome to return to the emergency department.  He should try to stay on a low-salt diet.  You will need to follow-up with your cardiologist.

## 2024-08-10 DIAGNOSIS — K219 Gastro-esophageal reflux disease without esophagitis: Secondary | ICD-10-CM | POA: Diagnosis not present

## 2024-08-10 DIAGNOSIS — I10 Essential (primary) hypertension: Secondary | ICD-10-CM | POA: Diagnosis not present

## 2024-08-12 DIAGNOSIS — Z6834 Body mass index (BMI) 34.0-34.9, adult: Secondary | ICD-10-CM | POA: Diagnosis not present

## 2024-08-12 DIAGNOSIS — F408 Other phobic anxiety disorders: Secondary | ICD-10-CM | POA: Diagnosis not present

## 2024-08-19 DIAGNOSIS — N138 Other obstructive and reflux uropathy: Secondary | ICD-10-CM | POA: Diagnosis not present

## 2024-08-19 DIAGNOSIS — N401 Enlarged prostate with lower urinary tract symptoms: Secondary | ICD-10-CM | POA: Diagnosis not present

## 2024-09-01 ENCOUNTER — Ambulatory Visit (HOSPITAL_COMMUNITY)
Admission: RE | Admit: 2024-09-01 | Discharge: 2024-09-01 | Disposition: A | Discharge status: Home / Self Care | Source: Ambulatory Visit | Attending: Physician Assistant | Admitting: Physician Assistant

## 2024-09-01 VITALS — BP 110/60 | HR 52 | Ht 71.0 in | Wt 238.8 lb

## 2024-09-01 DIAGNOSIS — I4819 Other persistent atrial fibrillation: Secondary | ICD-10-CM | POA: Diagnosis not present

## 2024-09-01 DIAGNOSIS — I4891 Unspecified atrial fibrillation: Secondary | ICD-10-CM | POA: Diagnosis not present

## 2024-09-01 DIAGNOSIS — D6869 Other thrombophilia: Secondary | ICD-10-CM

## 2024-09-01 NOTE — Progress Notes (Addendum)
 Primary Care Physician: Orpha Yancey LABOR, MD Primary Cardiologist: Stanly LABOR Leavens, MD Electrophysiologist: None  Referring Physician: Almarie Crate NP   Melvin Turner is a 68 y.o. male with a history of HTN, SVT, OSA, atrial fibrillation who presents for follow up in the [facility].  The patient was initially diagnosed with atrial fibrillation 02/03/24 at a visit with Dr Mallipeddi. He was started on Lopressor  and Eliquis for stroke prevention. He underwent DCCV on 03/02/24 and was started on amiodarone  post procedure. At his follow up on 04/08/24 he was back in afib. A 3 day Zio monitor showed 100% afib burden. He was started on amiodarone  and underwent DCCV 05/10/24 as a bridge to ablation. He was seen by Dr Nancey and underwent afib and flutter ablation on 08/04/24. Amiodarone  was stopped at the time of ablation.    Patient returns for follow up for atrial fibrillation. He remains in SR today and feels well. He denies any interim symptoms of afib. He denies chest pain or groin issues. No bleeding issues on anticoagulation.   Today, he  denies symptoms of palpitations, chest pain, shortness of breath, orthopnea, PND, lower extremity edema, dizziness, presyncope, syncope, bleeding, or neurologic sequela. The patient is tolerating medications without difficulties and is otherwise without complaint today.    Atrial Fibrillation Risk Factors:  he does have symptoms or diagnosis of sleep apnea. he does not have a history of rheumatic fever. he does have a history of alcohol use. The patient does not have a history of early familial atrial fibrillation or other arrhythmias.  Atrial Fibrillation Management history:  Previous antiarrhythmic drugs: amiodarone   Previous cardioversions: 03/02/24, 05/10/24 Previous ablations: 08/04/24 Anticoagulation history: Eliquis  ROS- All systems are reviewed and negative except as per the HPI above.  Past Medical History:   Diagnosis Date   Arthritis    Atrial fibrillation (HCC)    BPH (benign prostatic hyperplasia)    Dysrhythmia    GERD (gastroesophageal reflux disease)    Hypertension     Current Outpatient Medications  Medication Sig Dispense Refill   amLODipine  (NORVASC ) 2.5 MG tablet Take 1 tablet by mouth once daily 30 tablet 3   Ascorbic Acid  (VITAMIN C  CR) 1000 MG TBCR Take 2,000 mg by mouth daily.     citalopram (CELEXA) 20 MG tablet Take 20 mg by mouth daily. (Patient taking differently: Take 20 mg by mouth at bedtime.)     ELIQUIS 5 MG TABS tablet Take 5 mg by mouth 2 (two) times daily.     fluticasone (FLONASE) 50 MCG/ACT nasal spray Place 1 spray into both nostrils daily as needed for allergies or rhinitis. (Patient taking differently: Place 1 spray into both nostrils as needed for allergies or rhinitis.)     furosemide  (LASIX ) 40 MG tablet Take 1 tablet (40 mg total) by mouth daily. 15 tablet 0   HYDROcodone -acetaminophen  (NORCO) 10-325 MG tablet Take 1 tablet by mouth 3 (three) times daily as needed for moderate pain (pain score 4-6) or severe pain (pain score 7-10).     LORazepam  (ATIVAN ) 1 MG tablet Take 1 tablet (1 mg total) by mouth 3 (three) times daily as needed for anxiety. (Patient taking differently: Take 1 mg by mouth at bedtime.) 15 tablet 0   methocarbamol  (ROBAXIN ) 500 MG tablet Take 1 tablet (500 mg total) by mouth 2 (two) times daily. 20 tablet 0   metoprolol  tartrate (LOPRESSOR ) 25 MG tablet Take 0.5 tablets (12.5 mg total) by  mouth 2 (two) times daily. 90 tablet 3   Multiple Vitamin (MULTIVITAMIN WITH MINERALS) TABS tablet Take 1 tablet by mouth daily.     olmesartan (BENICAR) 40 MG tablet Take 40 mg by mouth daily.     Omega-3 Fatty Acids (FISH OIL OMEGA-3) 1000 MG CAPS Take 3,000 mg by mouth daily.     omeprazole (PRILOSEC) 20 MG capsule Take 20 mg by mouth daily.     Saw Palmetto 450 MG CAPS Take 450 mg by mouth 2 (two) times daily.     tamsulosin  (FLOMAX ) 0.4 MG CAPS  capsule Take 0.4 mg by mouth daily.     No current facility-administered medications for this encounter.    Physical Exam: BP 110/60   Pulse (!) 52   Ht 5' 11 (1.803 m)   Wt 108.3 kg   BMI 33.31 kg/m   GEN: Well nourished, well developed in no acute distress CARDIAC: Regular rate and rhythm, no murmurs, rubs, gallops RESPIRATORY:  Clear to auscultation without rales, wheezing or rhonchi  ABDOMEN: Soft, non-tender, non-distended EXTREMITIES:  No edema; No deformity    Wt Readings from Last 3 Encounters:  09/01/24 108.3 kg  08/08/24 109.8 kg  08/04/24 109.8 kg     EKG today demonstrates  SB Vent. rate 52 BPM PR interval 192 ms QRS duration 90 ms QT/QTcB 438/407 ms   Echo 02/18/24 demonstrated   1. Left ventricular ejection fraction, by estimation, is 55 to 60%. The  left ventricle has normal function. Left ventricular endocardial border  not optimally defined to evaluate regional wall motion. There is mild  concentric left ventricular hypertrophy. Left ventricular diastolic parameters are indeterminate.   2. Right ventricular systolic function is normal. The right ventricular  size is normal. Tricuspid regurgitation signal is inadequate for assessing  PA pressure.   3. Right atrial size was mildly dilated.   4. The mitral valve is grossly normal. Trivial mitral valve  regurgitation.   5. The aortic valve is tricuspid. There is mild calcification of the  aortic valve. Aortic valve regurgitation is not visualized. Aortic valve  sclerosis/calcification is present, without any evidence of aortic  stenosis. Aortic valve mean gradient measures 6.0 mmHg.   6. Aortic dilatation noted. There is mild dilatation of the aortic root,  measuring 42 mm. There is mild dilatation of the ascending aorta,  measuring 41 mm.   7. Unable to estimate CVP.    CHA2DS2-VASc Score = 2  The patient's score is based upon: CHF History: 0 HTN History: 1 Diabetes History: 0 Stroke History:  0 Vascular Disease History: 0 Age Score: 1 Gender Score: 0       ASSESSMENT AND PLAN: Persistent Atrial Fibrillation/atrial flutter (ICD10:  I48.19) The patient's CHA2DS2-VASc score is 2, indicating a 2.2% annual risk of stroke.   S/p afib and flutter ablation 08/04/24, now off amiodarone .  Patient appears to be maintaining SR Continue Eliquis 5 mg BID with no missed doses for 3 months post ablation.  Continue Lopressor  12.5 mg BID  Secondary Hypercoagulable State (ICD10:  D68.69) The patient is at significant risk for stroke/thromboembolism based upon his CHA2DS2-VASc Score of 2.  Continue Apixaban (Eliquis). No bleeding issues.   HTN Stable on current regimen  OSA  Encouraged nightly CPAP   Follow up with Dr Nancey as scheduled.     [facility] [address] [place], [place] [address] [phone]

## 2024-10-22 ENCOUNTER — Ambulatory Visit: Attending: Cardiovascular Disease | Admitting: Cardiovascular Disease

## 2024-10-22 ENCOUNTER — Encounter: Payer: Self-pay | Admitting: Cardiovascular Disease

## 2024-10-22 VITALS — BP 120/68 | HR 63 | Ht 70.0 in | Wt 239.0 lb

## 2024-10-22 DIAGNOSIS — I4819 Other persistent atrial fibrillation: Secondary | ICD-10-CM

## 2024-10-22 NOTE — Progress Notes (Signed)
 Electrophysiology Office Note:    Date:  10/22/2024   ID:  PLUMMER MATICH, DOB 1956/07/17, MRN [id number]  PCP:  Orpha Yancey LABOR, MD   [facility] Providers Cardiologist:  Stanly LABOR Leavens, MD     Referring MD: Orpha Yancey LABOR, MD   History of Present Illness:    Melvin Turner is a 68 y.o. male with a medical history significant for atrial fibrillation and flutter, sleep apnea, hypertension, referred for arrhythmia management.      Discussed the use of AI scribe software for clinical note transcription with the patient, who gave verbal consent to proceed.  History of Present Illness The patient is a 68 year old with atrial fibrillation and atrial flutter who presents for arrhythmia management.  He was initially diagnosed with atrial fibrillation in March 2025 and underwent DC cardioversion in April 2025. He was started on amiodarone , but experienced a recurrence of atrial fibrillation by May 2025 and has persistently been in atrial fibrillation since then. He notes occasional palpitations but generally does not notice significant symptoms. There has been no change in symptoms since starting amiodarone .  He has a history of atrial flutter, initially identified as SVT, diagnosed in March 2023. During a visit to his pain doctor, his pulse was noted to be elevated, leading to further evaluation and diagnosis. He recalls being taken to the hospital due to concerns about his heart rhythm at that time.  He consumes three to five beers daily and rides a motorcycle, specifically a [employer]. He mentions not feeling as energetic as he did in his fifties, attributing this to aging.  He is currently on Eliquis to prevent stroke due to his atrial fibrillation. A transthoracic echocardiogram (TTE) in April 2025 showed LVEF 55-60%, mild LVH, mild aortic dilation, mildly dilated right atrium, and normal-sized left atrium. In May 2025, he had a 100% atrial fibrillation burden with  heart rates ranging from 46 to 146 beats per minute, averaging 82 beats per minute.  He underwent atrial fibrillation ablation on August 04, 2024 with possible ablation of the pulmonary veins and CTI ablation for typical atrial flutter.  He did not notice feeling particularly any better after the ablation but has remained in sinus rhythm.    A few days after the ablation, he went to the ER after suffering a panic attack and has since been taking lorazepam .          Today, he is at baseline, feels okay.  EKGs/Labs/Other Studies Reviewed Today:     Echocardiogram:  TTE April 2025 LVEF 55 to 60%.  Mild concentric LVH.  Right atrium mildly dilated.  Left atrium is normal in size.  Mild aortic dilation.   Monitors:  4 day monitor May 2025-- my interpretation Atrial fibrillation 100% burden Heart rate 46 to 146 bpm, average 82 bpm    EKG:         Physical Exam:    VS:  BP 120/68 (BP Location: Left Arm, Cuff Size: Normal)   Pulse 63   Ht 5' 10 (1.778 m)   Wt 239 lb (108.4 kg)   SpO2 93%   BMI 34.29 kg/m     Wt Readings from Last 3 Encounters:  10/22/24 239 lb (108.4 kg)  09/01/24 238 lb 12.8 oz (108.3 kg)  08/08/24 242 lb (109.8 kg)     GEN: Well nourished, well developed in no acute distress CARDIAC: iRRR, no murmurs, rubs, gallops RESPIRATORY:  Normal work of breathing MUSCULOSKELETAL: no  edema    ASSESSMENT & PLAN:     Persistent atrial fibrillation Rapid return of atrial fibrillation after cardioversion Loaded with amiodarone  and again had recurrence of atrial fibrillation despite amiodarone  and repeat DCCV Having mild fatigue with AF Maintaining sinus rhythm after ablation.  Off amiodarone     Secondary hypercoagulable state CHA2DS2-VASc score is 2 Continue apixaban 5 mg twice daily  Atrial flutter On EKG March 2023 Erroneously diagnosis of SVT in the past Will plan to perform CTI line at the time of his ablation  procedure.   Signed, Eulas FORBES Furbish, MD  10/22/2024 1:20 PM    [facility]

## 2024-10-22 NOTE — Patient Instructions (Signed)

## 2024-11-01 ENCOUNTER — Ambulatory Visit: Attending: Nurse Practitioner | Admitting: Nurse Practitioner

## 2024-11-01 ENCOUNTER — Encounter: Payer: Self-pay | Admitting: Nurse Practitioner

## 2024-11-01 VITALS — BP 116/63 | HR 62 | Ht 70.0 in | Wt 239.4 lb

## 2024-11-01 DIAGNOSIS — R5383 Other fatigue: Secondary | ICD-10-CM

## 2024-11-01 DIAGNOSIS — R0609 Other forms of dyspnea: Secondary | ICD-10-CM

## 2024-11-01 DIAGNOSIS — I4891 Unspecified atrial fibrillation: Secondary | ICD-10-CM

## 2024-11-01 DIAGNOSIS — I10 Essential (primary) hypertension: Secondary | ICD-10-CM

## 2024-11-01 DIAGNOSIS — I77819 Aortic ectasia, unspecified site: Secondary | ICD-10-CM

## 2024-11-01 NOTE — Patient Instructions (Signed)
 Medication Instructions:  Your physician recommends that you continue on your current medications as directed. Please refer to the Current Medication list given to you today.  *If you need a refill on your cardiac medications before your next appointment, please call your pharmacy*  Lab Work: None If you have labs (blood work) drawn today and your tests are completely normal, you will receive your results only by: MyChart Message (if you have MyChart) OR A paper copy in the mail If you have any lab test that is abnormal or we need to change your treatment, we will call you to review the results.  Testing/Procedures: NOne  Follow-Up: At [facility], you and your health needs are our priority.  As part of our continuing mission to provide you with exceptional heart care, our providers are all part of one team.  This team includes your primary Cardiologist (physician) and Advanced Practice Providers or APPs (Physician Assistants and Nurse Practitioners) who all work together to provide you with the care you need, when you need it.  Your next appointment:   6 month(s)  Provider:   Almarie Crate, NP      We recommend signing up for the patient portal called MyChart.  Sign up information is provided on this After Visit Summary.  MyChart is used to connect with patients for Virtual Visits (Telemedicine).  Patients are able to view lab/test results, encounter notes, upcoming appointments, etc.  Non-urgent messages can be sent to your provider as well.   To learn more about what you can do with MyChart, go to [email or web address].   Other Instructions

## 2024-11-01 NOTE — Progress Notes (Unsigned)
 Cardiology Office Note:  .   Date:  08/02/2024 ID:  [name], DOB 11/14/1956, MRN [id number] PCP: Orpha Yancey LABOR, MD  [facility] Providers Cardiologist:  Stanly LABOR Leavens, MD Electrophysiologist:  Eulas FORBES Furbish, MD    History of Present Illness: Melvin   JALYNN Turner is a 68 y.o. male with a PMH of persistent A-fib, hypertension, SVT, frequent PVCs, shortness of breath, GERD, BPH, presents today for scheduled follow-up.  Last seen by Dr. Mallipeddi on February 03, 2024.  Was found to be in persistent A-fib with frequent PVCs.  This was started on Lopressor  25 mg twice daily.  It was reported he does have occasional shortness of breath palpitations and chronic fatigue.  Was scheduled for DCCV.  Echocardiogram was arranged and revealed normal LVEF, indeterminate diastolic function, dilatation of aortic root at 42 mm and ascending aorta at 41 mm.  Recommended to obtain CTA of chest/aorta in 1 year.  Underwent cardioversion on March 02, 2024.  And underwent repeat cardioversion on May 10, 2024 that initially did show normal sinus rhythm-wore monitor-see full report below.  07/27/2024 - Here for follow-up.  He is scheduled for A-fib ablation later this week.  Overall doing well and denies any acute cardiac plaints or concerns today. Denies any chest pain, palpitations, syncope, presyncope, dizziness, orthopnea, PND, swelling or significant weight changes, acute bleeding, or claudication.  Continues to note fatigue and DOE related to his A-fib.  Underwent ablation on 08/04/2024. Did not notice particularly feeling better after ablation but remained in SR. A few days after ablation, went to ER after suffering panic attack, started taking lorazepam . Followed by EP and [facility]-fib Clinic.   11/01/2024 -  Here for follow-up. He states...       ROS: Negative.  See HPI.  Studies Reviewed: Melvin    EKG:         Cardiac monitor 04/2024:    Patch wear time was for 3 days and 7 hours.    100% atrial fibrillation burden, ranging from 46 to 146 PM with an average HR 82 bpm.   No evidence of ventricular arrhythmias, AV block or pauses. <1% PVC burden.   Patient triggered events correlated with atrial fibrillation (69 to 98 bpm) and PVC.   Echo 02/2024:   1. Left ventricular ejection fraction, by estimation, is 55 to 60%. The  left ventricle has normal function. Left ventricular endocardial border  not optimally defined to evaluate regional wall motion. There is mild  concentric left ventricular  hypertrophy. Left ventricular diastolic parameters are indeterminate.   2. Right ventricular systolic function is normal. The right ventricular  size is normal. Tricuspid regurgitation signal is inadequate for assessing  PA pressure.   3. Right atrial size was mildly dilated.   4. The mitral valve is grossly normal. Trivial mitral valve  regurgitation.   5. The aortic valve is tricuspid. There is mild calcification of the  aortic valve. Aortic valve regurgitation is not visualized. Aortic valve  sclerosis/calcification is present, without any evidence of aortic  stenosis. Aortic valve mean gradient  measures 6.0 mmHg.   6. Aortic dilatation noted. There is mild dilatation of the aortic root,  measuring 42 mm. There is mild dilatation of the ascending aorta,  measuring 41 mm.   7. Unable to estimate CVP.   Comparison(s): A prior study was performed on 03/01/2022. Prior images  reviewed side by side. LVEF normal range at 55-60%. Aortic valve sclerosis  without stenosis. Mildly  dilated aortic root and ascending aorta.  Physical Exam:   VS:  BP (!) 142/90   Pulse 62   Ht 5' 10 (1.778 m)   Wt 239 lb 6.4 oz (108.6 kg)   SpO2 95%   BMI 34.35 kg/m    Wt Readings from Last 3 Encounters:  11/01/24 239 lb 6.4 oz (108.6 kg)  10/22/24 239 lb (108.4 kg)  09/01/24 238 lb 12.8 oz (108.3 kg)    GEN: Obese, 68 y.o. male in no acute distress NECK: No JVD; No carotid bruits CARDIAC:  S1/S2, irregularly irregular rhythm, no murmurs, rubs, gallops RESPIRATORY:  Clear to auscultation without rales, wheezing or rhonchi  ABDOMEN: Soft, non-tender, non-distended EXTREMITIES:  No edema; No deformity   ASSESSMENT AND PLAN: .    Persistent A-fib He continues to remain persistent A-fib.  History of unsuccessful cardioversions.  See most recent monitor report noted above.  Continue Lopressor . HR well controlled.  Continue Eliquis 5 mg twice daily for rate control.  He is pending ablation in the next few days.  Discussed avoiding triggers to A-fib.  Recommended [employer] mobile app.   HTN BP stable. Discussed to monitor BP at home at least 2 hours after medications and sitting for 5-10 minutes.  No medication changes at this time. Heart healthy diet and regular cardiovascular exercise encouraged.   Fatigue, DOE Etiology multifactorial.  Do believe this is partially related to his A-fib. Recent Echo overall unremarkable. Continue to f/u with PCP.  Aortic dilatation Most recent echo from April 2025 showed mild dilatation of aortic root measuring four 2 mm along with mild dilatation of ascending aorta, measuring 41 mm.  Plan to update echocardiogram in April 2026 for further evaluation. Care and ED precautions discussed.   I spent a total duration of 30 minutes reviewing prior notes, reviewing outside records including  labs, EKG today, face-to-face counseling of  medical condition, pathophysiology, evaluation, management, and documenting the findings in the note.    Dispo: Follow-up with me/APP in 3 months or sooner if any changes.  Signed, Almarie Crate, NP

## 2024-11-25 ENCOUNTER — Other Ambulatory Visit: Payer: Self-pay | Admitting: Internal Medicine

## 2025-01-18 ENCOUNTER — Other Ambulatory Visit (HOSPITAL_COMMUNITY)
